# Patient Record
Sex: Female | Born: 1970
Health system: Southern US, Community
[De-identification: ages and names within clinical notes are randomized; demographics above are authoritative.]

## PROBLEM LIST (undated history)

## (undated) DIAGNOSIS — Z6833 Body mass index (BMI) 33.0-33.9, adult: Secondary | ICD-10-CM

## (undated) DIAGNOSIS — N809 Endometriosis, unspecified: Secondary | ICD-10-CM

## (undated) DIAGNOSIS — F649 Gender identity disorder, unspecified: Secondary | ICD-10-CM

## (undated) DIAGNOSIS — F411 Generalized anxiety disorder: Secondary | ICD-10-CM

## (undated) DIAGNOSIS — E785 Hyperlipidemia, unspecified: Principal | ICD-10-CM

## (undated) DIAGNOSIS — F329 Major depressive disorder, single episode, unspecified: Secondary | ICD-10-CM

## (undated) DIAGNOSIS — Z9889 Other specified postprocedural states: Secondary | ICD-10-CM

## (undated) DIAGNOSIS — K219 Gastro-esophageal reflux disease without esophagitis: Secondary | ICD-10-CM

## (undated) DIAGNOSIS — R112 Nausea with vomiting, unspecified: Secondary | ICD-10-CM

## (undated) HISTORY — DX: Gender identity disorder, unspecified: F64.9

## (undated) HISTORY — DX: Endometriosis, unspecified: N80.9

## (undated) HISTORY — DX: Hyperlipidemia, unspecified: E78.5

## (undated) HISTORY — DX: Major depressive disorder, single episode, unspecified: F32.9

## (undated) HISTORY — DX: Body mass index (BMI) 33.0-33.9, adult: Z68.33

## (undated) HISTORY — DX: Generalized anxiety disorder: F41.1

---
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
## 2000-02-20 ENCOUNTER — Other Ambulatory Visit: Admission: RE | Admit: 2000-02-20 | Discharge: 2000-02-20 | Payer: Self-pay | Admitting: Internal Medicine

## 2001-04-11 ENCOUNTER — Other Ambulatory Visit: Admission: RE | Admit: 2001-04-11 | Discharge: 2001-04-11 | Payer: Self-pay | Admitting: Obstetrics and Gynecology

## 2001-05-23 ENCOUNTER — Ambulatory Visit (HOSPITAL_COMMUNITY): Admission: RE | Admit: 2001-05-23 | Discharge: 2001-05-23 | Payer: Self-pay | Admitting: Obstetrics and Gynecology

## 2001-05-23 ENCOUNTER — Encounter: Payer: Self-pay | Admitting: Obstetrics and Gynecology

## 2001-10-28 ENCOUNTER — Encounter (INDEPENDENT_AMBULATORY_CARE_PROVIDER_SITE_OTHER): Payer: Self-pay | Admitting: *Deleted

## 2001-10-28 ENCOUNTER — Inpatient Hospital Stay (HOSPITAL_COMMUNITY): Admission: AD | Admit: 2001-10-28 | Discharge: 2001-10-31 | Payer: Self-pay | Admitting: Obstetrics and Gynecology

## 2001-11-11 ENCOUNTER — Encounter: Admission: RE | Admit: 2001-11-11 | Discharge: 2001-12-11 | Payer: Self-pay | Admitting: Obstetrics and Gynecology

## 2004-12-17 HISTORY — PX: OOPHORECTOMY: SHX86

## 2005-02-22 ENCOUNTER — Other Ambulatory Visit: Admission: RE | Admit: 2005-02-22 | Discharge: 2005-02-22 | Payer: Self-pay | Admitting: Internal Medicine

## 2005-12-17 HISTORY — PX: COLONOSCOPY: SHX174

## 2005-12-17 HISTORY — PX: FINGER SURGERY: SHX640

## 2006-02-26 LAB — HM PAP SMEAR

## 2006-02-27 ENCOUNTER — Other Ambulatory Visit: Admission: RE | Admit: 2006-02-27 | Discharge: 2006-02-27 | Payer: Self-pay | Admitting: Internal Medicine

## 2006-12-17 LAB — HM COLONOSCOPY

## 2007-04-16 ENCOUNTER — Ambulatory Visit: Payer: Self-pay | Admitting: Gastroenterology

## 2007-05-16 ENCOUNTER — Ambulatory Visit: Payer: Self-pay | Admitting: Obstetrics and Gynecology

## 2007-05-23 ENCOUNTER — Ambulatory Visit: Payer: Self-pay | Admitting: Obstetrics and Gynecology

## 2008-10-12 ENCOUNTER — Ambulatory Visit: Payer: Self-pay | Admitting: Internal Medicine

## 2009-08-08 ENCOUNTER — Ambulatory Visit: Payer: Self-pay | Admitting: Internal Medicine

## 2010-02-09 ENCOUNTER — Ambulatory Visit: Payer: Self-pay | Admitting: Internal Medicine

## 2010-12-22 ENCOUNTER — Ambulatory Visit
Admission: RE | Admit: 2010-12-22 | Discharge: 2010-12-22 | Payer: Self-pay | Source: Home / Self Care | Attending: Internal Medicine | Admitting: Internal Medicine

## 2011-05-04 NOTE — Op Note (Signed)
[facility]  Patient:    Katherine Steele, Katherine Steele Visit Number: [id number] MRN: [id number]          Service Type: OBS Location: [facility] Attending Physician:  Oliver Pila Dictated by:   Alvino Chapel, M.D. Proc. Date: 10/28/01                             Operative Report  PREOPERATIVE DIAGNOSES:       1. Forty-plus weeks gestation.                               2. Arrest of dilation.                               3. Nonreassuring fetal tracing.                               4. Suspected partial abruption.  POSTOPERATIVE DIAGNOSES:      1. Forty-plus weeks gestation.                               2. Arrest of dilation.                               3. Nonreassuring fetal tracing.                               4. Suspected partial abruption.  OPERATION:                    Cesarean section.  SURGEON:                      Alvino Chapel, M.D.  ANESTHESIA:                   Epidural.  FINDINGS:                     There was a viable female infant in the vertex presentation.  Apgars 8 and 9.  Weight 9 pounds 6 ounces.  There was a double nuchal cord noted at delivery.  ESTIMATED BLOOD LOSS:         700 cc.  URINE OUTPUT:                 450 cc clear urine.  IV FLUIDS:                    1800 cc lactated Ringers.  DESCRIPTION OF PROCEDURE:     The patient was taken to the operating room where Allis clamp test was found to be adequate and insured good anesthesia. The patient was then prepped and draped in the normal sterile fashion in the dorsal supine position with a leftward tilt.   A Pfannenstiel skin incision was then made and carried through to the underlying layer of fascia by sharp dissection and Bovie cautery.  The fascia was then nicked in the midline and the incision was extended laterally with Mayo scissors.  The inferior aspect of the incision was grasped with Kocher clamps, elevated and  dissected off the off the underlying  rectus muscles.  In a similar fashion, the superior aspect was grasped, elevated and dissected off the rectus muscles.  The rectus muscles were then separated in the midline, the peritoneal layer identified and entered bluntly.  The peritoneal incision was then extended both superiorly and inferiorly with our full attention to avoid both bowel and bladder.  The bladder blade was then inserted and a bladder flap created with Metzenbaum scissors.  This was then peeled away from the lower uterine segment and the bladder blade replaced.  The lower uterine segment was then incised in a transverse fashion and extended bluntly digitally.  The infants head was then delivered without difficulty.  A double nuchal cord was noted and reduced.  The remainder of the infant was delivered without problems.  Bulb suctioned and the cord clamped and handed to the waiting pediatricians.  Cord blood was obtained and the placenta was expressed manually.  The uterus was then slightly atonic but responded well to rapid Pitocin infusion and bimanual massage.  The uterine incision was then closed with 0 chromic in a running locked fashion.  After closure, the tubes and ovaries were inspected and found to be normal and the uterine incision itself was found to be hemostatic. Therefore, all instruments and sponges were removed from the abdomen.  The rectus muscles were reapproximated with three interrupted sutures.  The fascia was closed with 0 Vicryl in a running fashion and the skin was closed with staples.  Sponge, lap and needle counts were again correct x 2.  The patient was taken to the recovery room in stable condition. Dictated by:   Alvino Chapel, M.D. Attending Physician:  Oliver Pila DD:  10/28/01 TD:  10/29/01 Job: [id number]

## 2011-05-04 NOTE — Discharge Summary (Signed)
[facility]  Patient:    Katherine Steele, CUEN Visit Number: [id number] MRN: [id number]          Service Type: OBS Location: 910A [id number] [id number] Attending Physician:  Oliver Pila Dictated by:   Alvino Chapel, M.D. Admit Date:  10/28/2001 Discharge Date: 10/31/2001                             Discharge Summary  DISCHARGE DIAGNOSES:          1. Term pregnancy at 40 weeks.                               2. Arrest of dilation.                               3. Suspected partial abruption.                               4. Nonreassuring fetal tracing.                               5. Primary low transverse cesarean section.  DISCHARGE MEDICATIONS:        1. Motrin 600 mg q.6h. p.r.n.                               2. Percocet 1-2 tablets p.o. q.4h. p.r.n.  DISCHARGE FOLLOW-UP:          The patient is to follow up in two weeks for an incision check, and again in six weeks for her postpartum examination.  CURRENT HISTORY:              The patient is a 40 year old G1, P0 who was admitted at 40-5/7 weeks for induction, given postdates and favorable cervix. The pregnancy had been complicated by an isolated echogenic endocardiac focus fetus, and no other significant findings.  PRENATAL LABS:  Blood type A positive.  Antibody negative.  RPR nonreactive. Rubella immune.  Hepatitis B surface antigen negative.  HIV negative.  GC negative.  Chlamydia negative.  GBS negative.  PAST OBSTETRICAL HISTORY:     None.  PAST GYN HISTORY:             No abnormal Pap smears.  PAST SURGICAL HISTORY:        Tonsillectomy.  PAST MEDICAL HISTORY:         None.  ALLERGIES:                    No known drug allergies.  MEDICATIONS:                  None.  HOSPITAL COURSE:              The patient is afebrile with stable vital signs. Fetal heart rate was reactive, with small variables noted on admission. Estimated fetal weight was 8.0 to 8.5 pounds.  Cervix was 50%  effaced, 3 cm and -2 station.  The patient had assisted rupture of membranes, with clear fluid obtained.  She was begun on IV Pitocin.  She began to progress throughout the day and by evening time had reached  7 cm, with 80% effaced and the head remained at a -1 station.  Over the next two hours the patients contractions were monitored with an IUPC, and were borderline adequate in a somewhat dysfunctional pattern.  Approximately two hours after IUPC placement the patient began to pass significant clots and vaginal bleeding.  The fetal heart rate remained reassuring, with some accelerations and occasional variable decelerations; however, a marginal abruption was suspected.  The patients Pitocin was again adjusted to achieve somewhat adequate contractions.  However, in two hours she was 80%, 7-8 and -1 station; the fetal heart rate had begun to have decreasing variability with no significant scalp stimulation.  Given her arrest of dilation and developing nonreassuring fetal tracing, the decision was made with the patient to proceed with cesarean section.  She then underwent a low transverse cesarean section under epidural anesthesia.  She was delivered of a vigorous female infant.  Apgars were 8 and 9.  Weight was 9 pounds 8 ounces, and there was a double nuchal cord noted. There were some clots and the placenta was sent to pathology for evaluation for possible small abruption.  The patient was then admitted for routine postoperative care and did very well.  Her postoperative hemoglobin went from 13.5 to 11.7.  On postoperative day #3 she was tolerating regular diet, passing flatus and her incision was well approximated with Steri-Strips.  No erythema was noted.  Therefore, she was felt stable for discharge and was discharged to home, to follow up in two weeks for her incision check. Dictated by:   Alvino Chapel, M.D. Attending Physician:  Oliver Pila DD:  10/31/01 TD:   10/31/01 Job: [id number]

## 2012-02-07 ENCOUNTER — Encounter: Payer: Self-pay | Admitting: Internal Medicine

## 2012-02-07 ENCOUNTER — Ambulatory Visit (INDEPENDENT_AMBULATORY_CARE_PROVIDER_SITE_OTHER): Payer: 59 | Admitting: Internal Medicine

## 2012-02-07 VITALS — BP 110/64 | HR 72 | Temp 98.5°F | Ht 68.5 in | Wt 188.0 lb

## 2012-02-07 DIAGNOSIS — F330 Major depressive disorder, recurrent, mild: Secondary | ICD-10-CM | POA: Insufficient documentation

## 2012-02-07 DIAGNOSIS — F329 Major depressive disorder, single episode, unspecified: Secondary | ICD-10-CM

## 2012-02-07 DIAGNOSIS — F419 Anxiety disorder, unspecified: Secondary | ICD-10-CM | POA: Insufficient documentation

## 2012-02-07 DIAGNOSIS — F411 Generalized anxiety disorder: Secondary | ICD-10-CM

## 2012-02-07 DIAGNOSIS — F3289 Other specified depressive episodes: Secondary | ICD-10-CM

## 2012-02-07 DIAGNOSIS — F331 Major depressive disorder, recurrent, moderate: Secondary | ICD-10-CM | POA: Insufficient documentation

## 2012-02-07 NOTE — Patient Instructions (Addendum)
Take Wellbutrin XL 150 mg daily for 2 weeks and call with progress report.

## 2012-02-07 NOTE — Progress Notes (Signed)
  Subjective:    Patient ID: Katherine Steele, female    DOB: 1971/08/10, 41 y.o.   MRN: [id number]  HPI Patient in today to discuss anxiety& depression. She used to take Prozac but she felt that  always gave her a headache. Says she did not tolerate Paxil or Zoloft either. She's been doing well up until recently. Her husband has been unemployed for for several years.. He was working as an International aid/development worker at a convenient store but had marijuana detected in a urine specimen on random drug testing. He lost his job. Has been unable to find other employment. He continues to have issues with his back and is scheduled to see a neurosurgeon in the near future. He has been going to [employer] @ [facility]. Her son is 36 years old and has mild autism. Husband went to truck driving school but apparently could not pass the driver's road test. She realizes she was getting angry that he could not find employment. She does have some Xanax on hand for anxiety but think she needs to restart antidepressant therapy. Has had some trouble sleeping. Patient is bisexual. At one point, patient considered having a sex change operation a few years ago. Husband is 19 years old. They met a number of years ago when they were working for the same employer. Patient is employed but basically has become the sole provider at this point. Says that she is less angry recently. Has thought about seeking counseling at [facility]. They have been attending church together and that seems to have helped a bit.    Review of Systems     Objective:   Physical Exam affect is depressed. Patient not suicidal. Thought processes are normal. Judgment normal.        Assessment & Plan:  Anxiety  Depression  Situational stress  Plan: Patient wants to try Wellbutrin instead of an SSRI. Have prescribed Wellbutrin XL 150 mg daily. She is to call in 2 weeks and let me know if it is working. We can increase to 300 mg XL. Explained to her it would not  probably be of benefit to go higher than 300 mg of Wellbutrin. Also explained to her that we might need to add an SSRI. She has Xanax on hand for anxiety if she needs it. She thought about taking BuSpar but read it could cause some dizziness. I think we should just stay with Xanax which worked well for her and she obviously does not have a dependency on it. She has some leftover from previous prescription.

## 2012-02-28 ENCOUNTER — Telehealth: Payer: Self-pay | Admitting: Internal Medicine

## 2012-02-28 NOTE — Telephone Encounter (Signed)
Continue same dose for now

## 2012-03-05 ENCOUNTER — Other Ambulatory Visit: Payer: Self-pay | Admitting: Internal Medicine

## 2012-03-21 ENCOUNTER — Encounter: Payer: Self-pay | Admitting: Vascular Surgery

## 2012-03-24 ENCOUNTER — Ambulatory Visit (INDEPENDENT_AMBULATORY_CARE_PROVIDER_SITE_OTHER): Payer: BC Managed Care – PPO | Admitting: Vascular Surgery

## 2012-03-24 ENCOUNTER — Ambulatory Visit (INDEPENDENT_AMBULATORY_CARE_PROVIDER_SITE_OTHER): Payer: BC Managed Care – PPO | Admitting: *Deleted

## 2012-03-24 ENCOUNTER — Encounter: Payer: Self-pay | Admitting: Vascular Surgery

## 2012-03-24 VITALS — BP 117/77 | HR 52 | Resp 16 | Ht 68.5 in | Wt 188.0 lb

## 2012-03-24 DIAGNOSIS — I83893 Varicose veins of bilateral lower extremities with other complications: Secondary | ICD-10-CM | POA: Insufficient documentation

## 2012-03-24 DIAGNOSIS — R609 Edema, unspecified: Secondary | ICD-10-CM

## 2012-03-24 DIAGNOSIS — M6240 Contracture of muscle, unspecified site: Secondary | ICD-10-CM

## 2012-03-24 NOTE — Progress Notes (Signed)
Subjective:     Patient ID: Katherine Steele, female   DOB: 03-Mar-1971, 41 y.o.   MRN: [id number]  HPI is 41 year old healthy female has been having aching discomfort in both legs particularly below the knees. This occurs generally at night when she is in bed and her legs become quite restless. She denies classic claudication symptoms and has no history of DVT, thrombophlebitis, stasis ulcers, bleeding, or bulging varicosities. She does not experience swelling as the day progresses. She has not tried elevating her legs at night. Generally she will get up and walk around and this will improve her symptoms. Right leg is equal to the left.  Past Medical History  Diagnosis Date  . Anxiety   . Depression     History  Substance Use Topics  . Smoking status: Former Smoker    Types: Cigarettes    Quit date: 02/06/1993  . Smokeless tobacco: Never Used  . Alcohol Use: Yes     rarely    Family History  Problem Relation Age of Onset  . Diabetes Mother   . Hypertension Father     No Known Allergies  Current outpatient prescriptions:buPROPion (WELLBUTRIN XL) 150 MG 24 hr tablet, TAKE 1 TABLET BY MOUTH DAILY, Disp: 30 tablet, Rfl: 0;  ALPRAZolam (XANAX) 0.5 MG tablet, Take 0.5 mg by mouth at bedtime as needed., Disp: , Rfl: ;  FLUoxetine (PROZAC) 40 MG capsule, Take 40 mg by mouth daily., Disp: , Rfl:   BP 117/77  Pulse 52  Resp 16  Ht 5' 8.5" (1.74 m)  Wt 188 lb (85.276 kg)  BMI 28.17 kg/m2  Body mass index is 28.17 kg/(m^2).          Review of Systems denies chest pain, dyspnea on exertion, PND, orthopnea, chronic bronchitis. All systems are negative and complete review of systems    Objective:   Physical Exam blood pressure 117/77 heart rate 60 respirations 16 Gen.-alert and oriented x3 in no apparent distress HEENT normal for age Lungs no rhonchi or wheezing Cardiovascular regular rhythm no murmurs carotid pulses 3+ palpable no bruits audible Abdomen soft nontender no  palpable masses Musculoskeletal free of  major deformities Skin clear -no rashes Neurologic normal Lower extremities 3+ femoral and dorsalis pedis pulses palpable bilaterally with no edema she has diffuse spider veins in both anterior thighs extending into the lateral calf area bilaterally. There is no hyperpigmentation or ulceration or bulging varicosities noted.  Today I ordered a venous duplex exam of both legs which I reviewed and interpreted. There are some areas in both GS disease that have reflux but the caliber of the vein is small bilaterally. There is no DVT. There is mild reflux in the deep system.     Assessment:     Bilateral spider veins Aching discomfort in both lower extremities usually at night-etiology unknown     Plan:     Have discussed sclerotherapy for spider veins with patient and she will consider this. Do not think spider veins are causing her symptoms

## 2012-04-01 ENCOUNTER — Encounter: Payer: Self-pay | Admitting: *Deleted

## 2012-04-02 ENCOUNTER — Ambulatory Visit (INDEPENDENT_AMBULATORY_CARE_PROVIDER_SITE_OTHER): Payer: BC Managed Care – PPO | Admitting: *Deleted

## 2012-04-02 ENCOUNTER — Encounter: Payer: Self-pay | Admitting: *Deleted

## 2012-04-02 DIAGNOSIS — I781 Nevus, non-neoplastic: Secondary | ICD-10-CM

## 2012-04-02 NOTE — Progress Notes (Signed)
X=.3% Sotradecol administered with a 27g butterfly.  Patient received a total of 12cc.  Treated majority of her spider veins which cause her pain/restless legs. Tol well. Easy access. She will need another treatment to get them all. Will follow prn.  Photos: yes  Compression stockings applied: yes

## 2012-04-03 ENCOUNTER — Encounter: Payer: Self-pay | Admitting: Vascular Surgery

## 2012-04-07 ENCOUNTER — Other Ambulatory Visit: Payer: Self-pay | Admitting: Internal Medicine

## 2012-04-08 NOTE — Procedures (Unsigned)
LOWER EXTREMITY VENOUS REFLUX EXAM  INDICATION:  Bilateral lower extremity edema.  EXAM:  Using color-flow imaging and pulse Doppler spectral analysis, the bilateral common femoral, superficial femoral, popliteal, posterior tibial, greater and lesser saphenous veins are evaluated.  There is evidence suggesting mild deep venous insufficiency in the bilateral lower extremities.  The right saphenofemoral junction is competent. The left saphenofemoral junction is slightly incompetent with reflux >500 milliseconds.  The bilateral GSV are not competent with Reflux of >500 milliseconds with the caliber as described below.)   The right proximal  small saphenous vein demonstrates incompetency.  GSV Diameter (used if found to be incompetent only)                                                       Right      Left Proximal Greater Saphenous Vein                       0.40 cm Proximal-to-mid-thigh                                 0.19 cm    0.22 cm Mid thigh                                             0.26 cm    0.22 cm Mid-distal thigh                                      0.24 cm    0.20 cm Distal thigh                                          0.23 cm Knee                                                             0.20 cm  SSV RIGHT:  0.21cm   IMPRESSION: 1. The bilateral greater saphenous veins are not competent with reflux     >500 milliseconds. 2. The bilateral greater saphenous veins are not tortuous. 3. The deep venous system is slightly incompetent bilaterally with     reflux of >500 milliseconds. 4. The right small saphenous vein is not competent with Reflux of >500     milliseconds. 5. No evidence of deep venous thrombosis bilaterally.  ___________________________________________ Katherine Steele, M.D.  SS/MEDQ  D:  03/24/2012  T:  03/24/2012  Job:  [id number]

## 2012-04-09 ENCOUNTER — Ambulatory Visit: Payer: BC Managed Care – PPO

## 2012-05-06 ENCOUNTER — Telehealth: Payer: Self-pay

## 2012-05-06 MED ORDER — BUPROPION HCL ER (XL) 300 MG PO TB24: 300.0000 mg | ORAL_TABLET | Freq: Every day | ORAL | Status: DC

## 2012-05-06 NOTE — Telephone Encounter (Signed)
Patient is taking Wellbutrin 150 mg daily, but wants to increase the dose. Requesting a 90 day supply, although she only has 4 pills left. ?OV

## 2012-05-06 NOTE — Telephone Encounter (Signed)
Patient was here in Feb and was told to call with progress report. This is the first progress report. Suggest we call in Wellbutrin 300mg  XL #90 with no refill and see her in next 2- 3 months. They are having financial problems.

## 2012-05-23 ENCOUNTER — Other Ambulatory Visit: Payer: Self-pay

## 2012-05-23 MED ORDER — BUPROPION HCL ER (XL) 300 MG PO TB24: 300.0000 mg | ORAL_TABLET | Freq: Every day | ORAL | Status: DC

## 2012-07-17 ENCOUNTER — Ambulatory Visit (INDEPENDENT_AMBULATORY_CARE_PROVIDER_SITE_OTHER): Payer: BC Managed Care – PPO | Admitting: Internal Medicine

## 2012-07-17 ENCOUNTER — Encounter: Payer: Self-pay | Admitting: Internal Medicine

## 2012-07-17 VITALS — BP 116/68 | HR 80 | Temp 98.2°F | Ht 68.0 in | Wt 179.0 lb

## 2012-07-17 DIAGNOSIS — F411 Generalized anxiety disorder: Secondary | ICD-10-CM

## 2012-07-17 DIAGNOSIS — F329 Major depressive disorder, single episode, unspecified: Secondary | ICD-10-CM

## 2012-07-17 DIAGNOSIS — F640 Transsexualism: Secondary | ICD-10-CM | POA: Insufficient documentation

## 2012-07-17 DIAGNOSIS — F419 Anxiety disorder, unspecified: Secondary | ICD-10-CM

## 2012-07-17 DIAGNOSIS — F529 Unspecified sexual dysfunction not due to a substance or known physiological condition: Secondary | ICD-10-CM

## 2012-07-17 NOTE — Progress Notes (Signed)
  Subjective:    Patient ID: Katherine Steele, female    DOB: 1971/11/05, 41 y.o.   MRN: [id number]  HPI 41 year old white female with history of anxiety depression in today for medication recheck. She feels it Wellbutrin XL 300 mg daily was too strong for her and call some headaches. Admits being depressed. Says issues surround being unhappy in her marriage. Patient has continued to consider sex change operation. Has been to counselor. Has not been lately and will be returning to counseling soon. She has a 41 year old son from her marriage. He apparently has some mild autism. Admits to many years of conflict  with her sexuality. She has been to see endocrinologist about possible sex change operation. She is worried how this might affect her son and her relationship with her son. She is also worried about how it would affect situation with her husband. She and her husband basically are companions with sexual activity. Patient says for years she has thought she should be female. She went through a lot of religious conflict about it. Says she's work through that. Is worried about how this would effect her immediate family. Thinks mother might be supportive. Affect is a bit flat today. Seems genuinely depressed. Says she knows that the issues surrounding her depression in today with conflict with her sexuality. Is not suicidal. Did not like taking Prozac. Doesn't want to add any other antidepressant to the Wellbutrin.    Review of Systems     Objective:   Physical Exam affect is flat, judgment appears to be appropriate. Thought process is normal. Not crying.  Spent 20 minutes speaking with patient about these issues, discussed medication possibilities, expressed support and concern.        Assessment & Plan:  Conflict with sexuality  Depression  Anxiety  Situational stress  Plan: Xanax 0.5 mg (#60) 1/2-1 by mouth twice a day to take a local drug store. Xanax 0.5 mg (#180) one half to one by mouth  twice a day with no refill sent to mail order prescription service. Wellbutrin XL 150 mg ( #30 )  by mouth daily with one refill to take to a local drug store. Wellbutrin XL 150 mg #90 with when necessary 1 year refill sent to mail order prescription service. Patient is to call me with progress report in a few weeks. Encouraged her to return to counseling.

## 2012-07-17 NOTE — Patient Instructions (Addendum)
Wellbutrin has been reduced from 300 mg to 150 mg XL daily. Take Xanax sparingly as needed for anxiety. Please return to counseling regarding sexuality issues.

## 2012-07-18 ENCOUNTER — Other Ambulatory Visit: Payer: Self-pay

## 2012-07-18 MED ORDER — ALPRAZOLAM 0.5 MG PO TABS: 0.5000 mg | ORAL_TABLET | Freq: Two times a day (BID) | ORAL | Status: DC | PRN

## 2012-07-18 MED ORDER — BUPROPION HCL ER (XL) 150 MG PO TB24: 150.0000 mg | ORAL_TABLET | Freq: Every day | ORAL | Status: DC

## 2012-09-23 ENCOUNTER — Ambulatory Visit: Payer: Self-pay | Admitting: Internal Medicine

## 2012-12-17 DIAGNOSIS — F649 Gender identity disorder, unspecified: Secondary | ICD-10-CM

## 2012-12-17 HISTORY — DX: Gender identity disorder, unspecified: F64.9

## 2012-12-17 HISTORY — PX: TOTAL ABDOMINAL HYSTERECTOMY: SHX209

## 2012-12-25 ENCOUNTER — Ambulatory Visit: Payer: Self-pay | Admitting: Obstetrics and Gynecology

## 2012-12-25 LAB — CBC
HCT: 39.6 % (ref 35.0–47.0)
HGB: 13.2 g/dL (ref 12.0–16.0)
MCV: 90 fL (ref 80–100)
Platelet: 289 10*3/uL (ref 150–440)
RBC: 4.43 10*6/uL (ref 3.80–5.20)
WBC: 6.9 10*3/uL (ref 3.6–11.0)

## 2012-12-25 LAB — PREGNANCY, URINE: Pregnancy Test, Urine: NEGATIVE m[IU]/mL

## 2012-12-29 ENCOUNTER — Inpatient Hospital Stay: Payer: Self-pay | Admitting: Obstetrics and Gynecology

## 2012-12-31 LAB — PATHOLOGY REPORT

## 2013-04-03 IMAGING — CT CT ABD-PELV W/ CM
1 of 2 series · 15 of 32 positions shown, 19 images · non-contrast
Comparison: none

REASON FOR EXAM: Call report [phone]   Eval appendicitis   RLQ pain
COMMENTS:

[Series 2: soft tissue · axial · 0.73mm/px · z∈[-536,-113]mm · 15 of 153 slices shown, 19 images]
[im 6/153  soft-tissue]
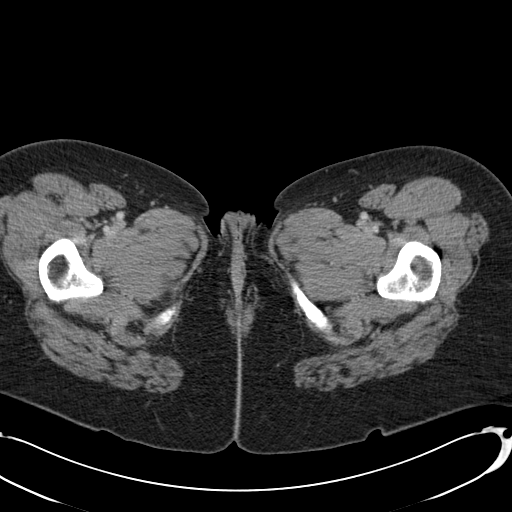
[im 6/153  bone]
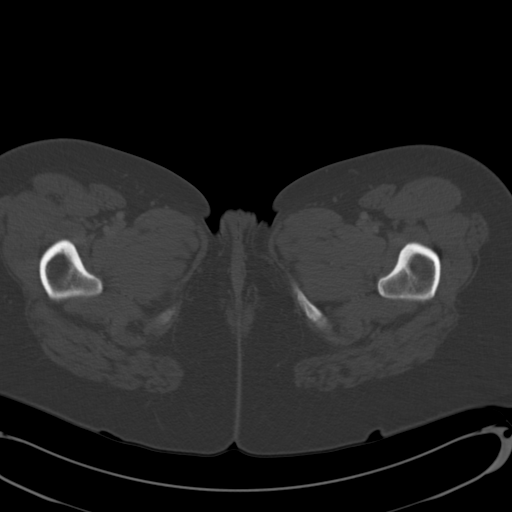
[im 18/153  soft-tissue]
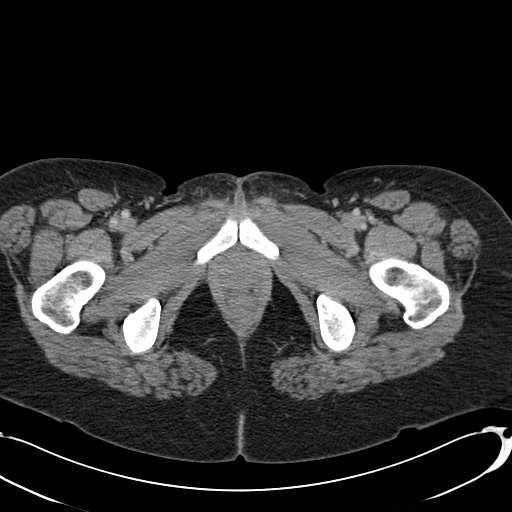
[im 30/153  soft-tissue]
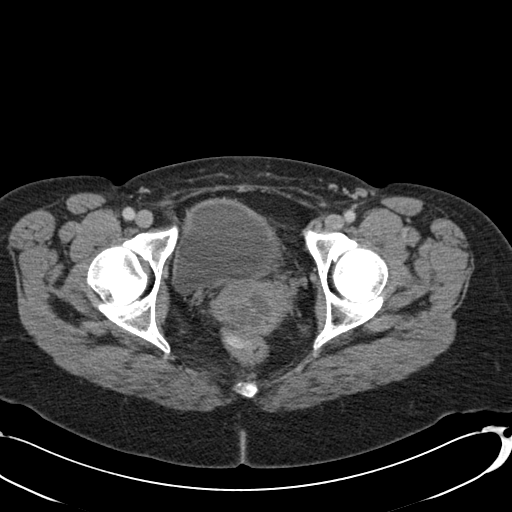
[im 41/153  soft-tissue]
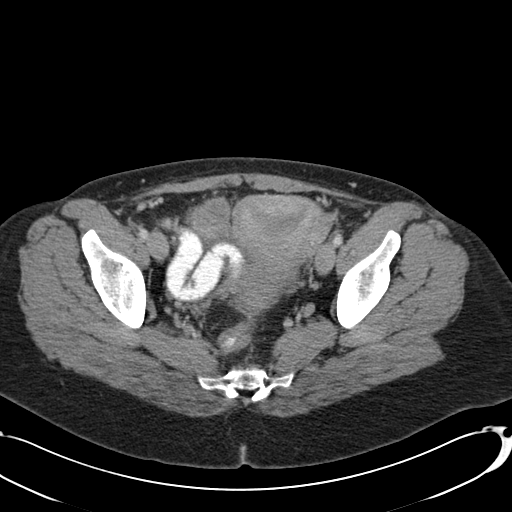
[im 53/153  soft-tissue]
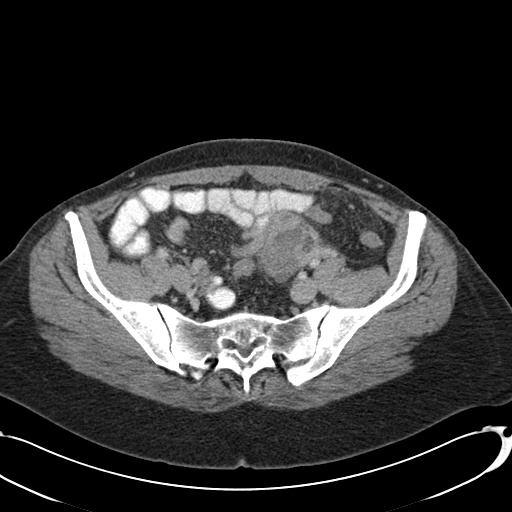
[im 65/153  soft-tissue]
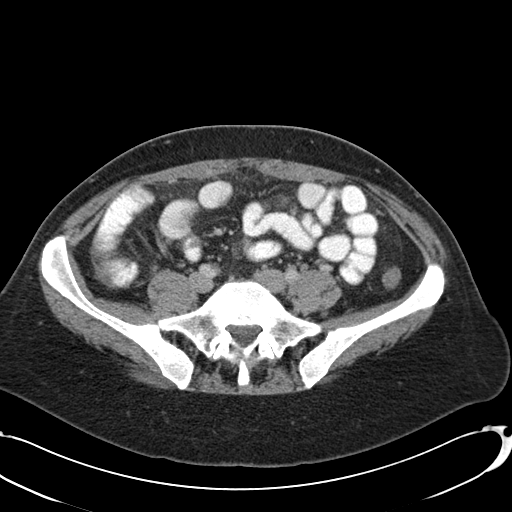
[im 77/153  soft-tissue]
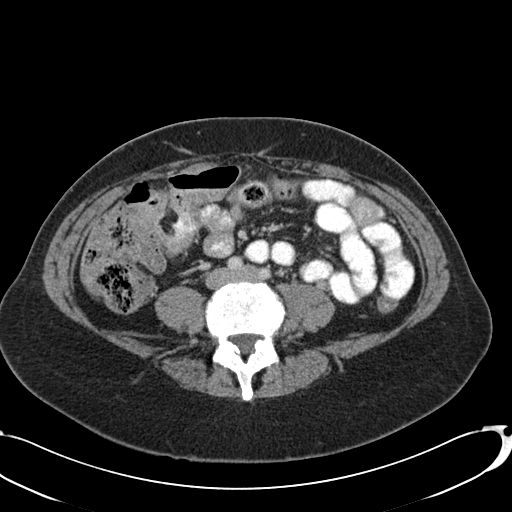
[im 88/153  soft-tissue]
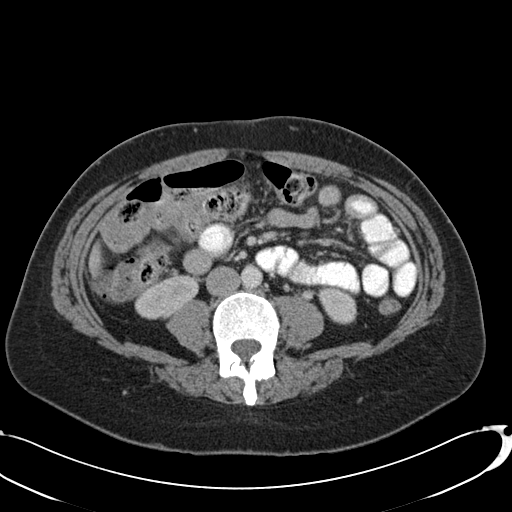
[im 100/153  soft-tissue]
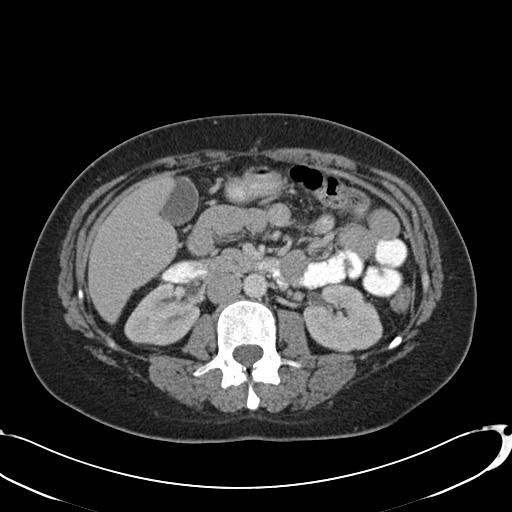
[im 100/153  bone]
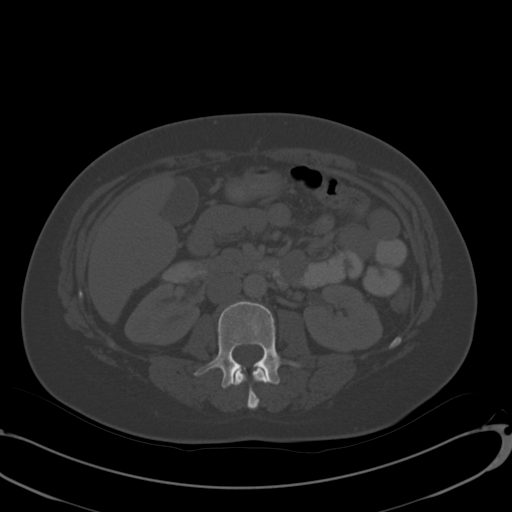
[im 112/153  soft-tissue]
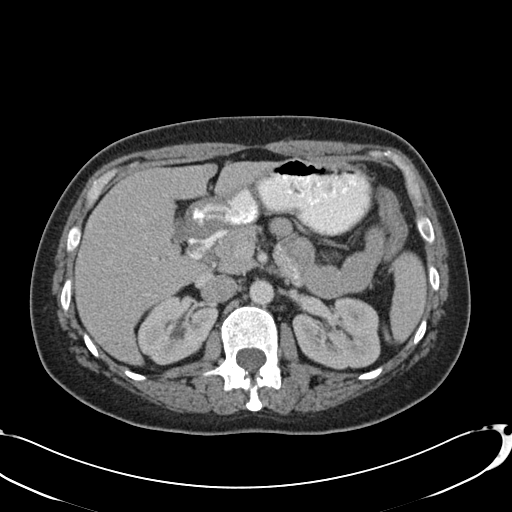
[im 123/153  soft-tissue]
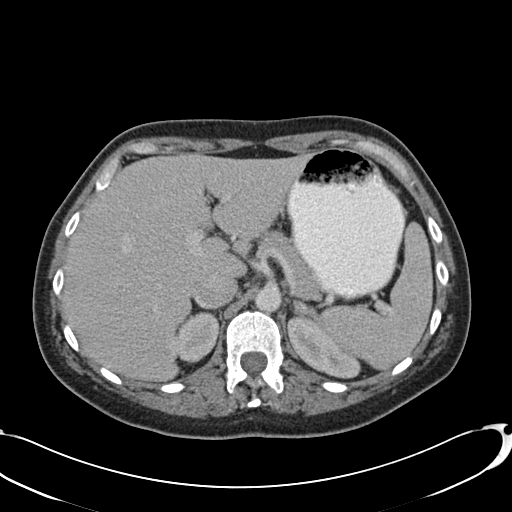
[im 129/153  lung]
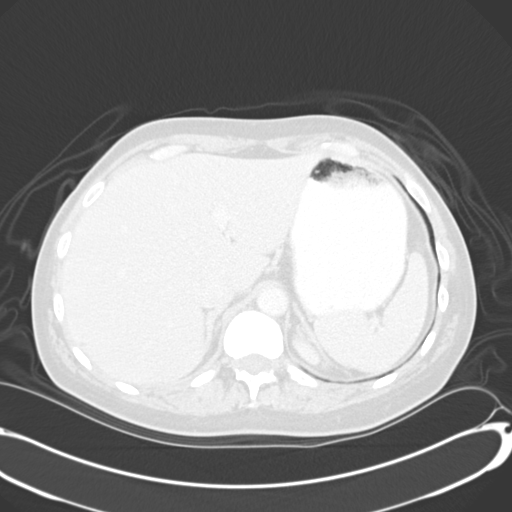
[im 135/153  soft-tissue]
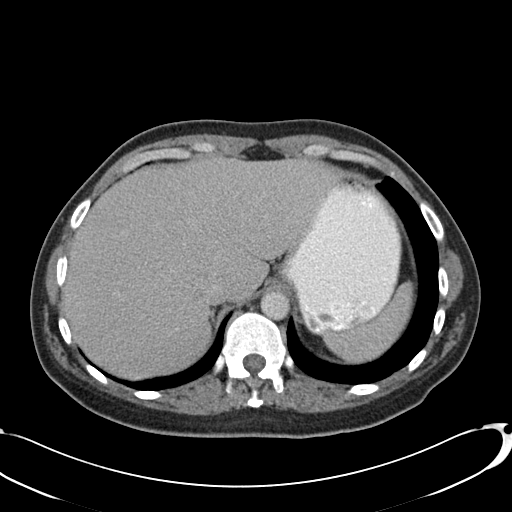
[im 135/153  lung]
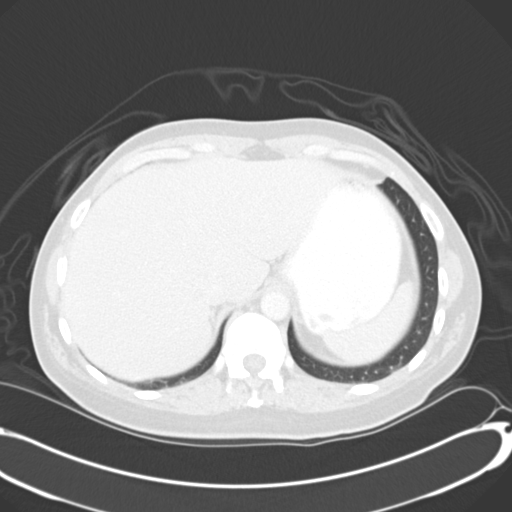
[im 141/153  lung]
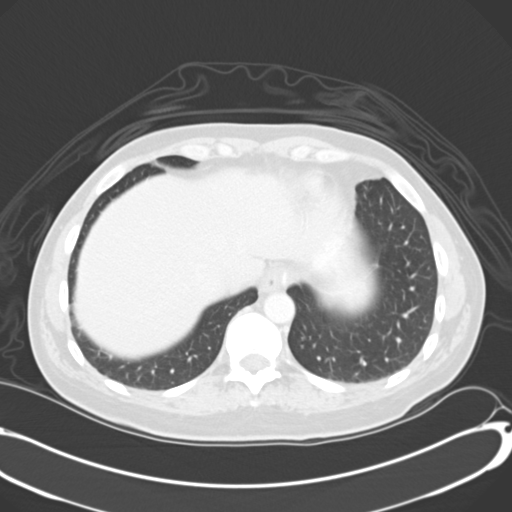
[im 147/153  soft-tissue]
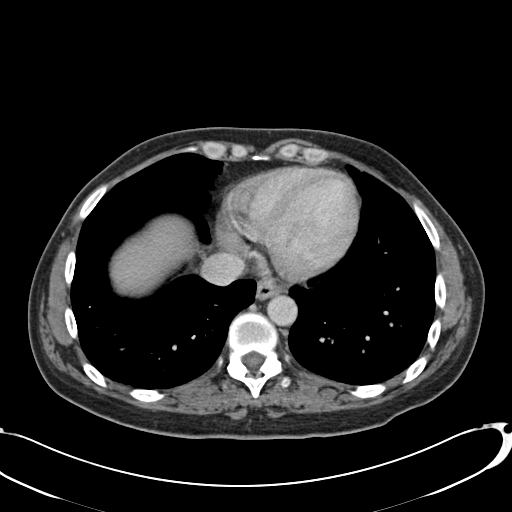
[im 147/153  lung]
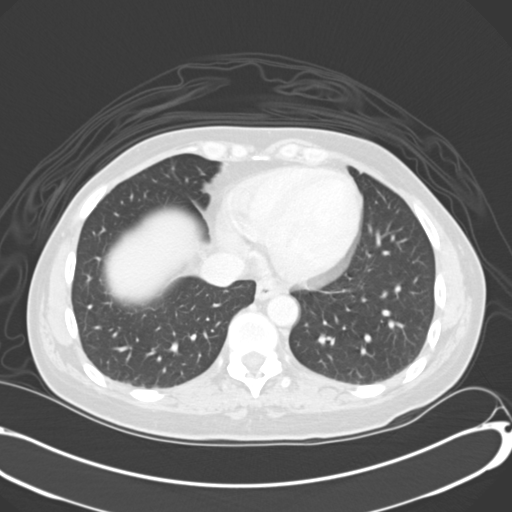

[15 of 32 positions shown; findings below may reference images not displayed]

PROCEDURE:     CT  - CT ABDOMEN / PELVIS  W  - September 23, 2012  [DATE]

RESULT:     Emergent CT of the abdomen and pelvis is performed with 100 mL
of Isovue 300 iodinated intravenous contrast with oral contrast also
utilized. Images are reconstructed at 3 mm slice thickness in the axial
plane area there is no previous exam for comparison.

The appendix is not identified. The patient reports previous surgical
history of resection of the right ovary. There is an abnormal appearance of
the left ovary presumably the heterogeneous mass superior to the uterus
which is somewhat to the left of midline. This mass contains a cyst the with
a diameter of approximately 4.4 cm on image 105. The overall size of the
mass which is presumably the ovary measures 5.77 x 4.12 cm on image 105. The
urinary bladder contains a small amount of urine. There is no ascites
comment definite inflammatory stranding or abnormal fluid collection. No
abnormal bowel distention or definite abnormal bowel wall thickening is
appreciated. Some of the loops of small bowel appear to have a suggestion of
some slight thickening of the wall but this could be secondary to
peristalsis. Correlate for enteritis. The abdominal aorta is normal in
caliber. Neither kidney shows a mass or obstructive change. No stones are
evident. The gallbladder is unremarkable. The liver, spleen and pancreas
appear normal. The adrenal glands are unremarkable. The lung bases appear
clear. The bony structures appear unremarkable.
IMPRESSION: 1. Nonvisualization of the appendix. This does not exclude the possibility
of appendicitis but no definite inflammatory changes are seen within the
right abdomen. Close correlation with clinical and laboratory data is
recommended. Surgical consultation should be considered if there is
continued clinical concern for acute appendicitis.
2. Mass superior to the bladder to the left of midline may represent the
left ovary with the cystic mass. Pelvic ultrasound could be performed 4
confirmatory evaluation.

[REDACTED]

## 2013-08-13 ENCOUNTER — Ambulatory Visit: Payer: BC Managed Care – PPO | Admitting: Family Medicine

## 2013-10-20 ENCOUNTER — Ambulatory Visit (INDEPENDENT_AMBULATORY_CARE_PROVIDER_SITE_OTHER): Payer: BC Managed Care – PPO | Admitting: General Surgery

## 2013-10-20 ENCOUNTER — Encounter: Payer: Self-pay | Admitting: General Surgery

## 2013-10-20 VITALS — BP 146/86 | HR 64 | Resp 12 | Ht 68.5 in | Wt 174.0 lb

## 2013-10-20 DIAGNOSIS — R1032 Left lower quadrant pain: Secondary | ICD-10-CM

## 2013-10-20 NOTE — Patient Instructions (Addendum)
Patient to be scheduled for abdominal/pelvis CT Scan.   Patient has been scheduled for a CT abdomen/pelvis with contrast at [facility] for 10-30-13 at 3 pm (arrive 2:45 pm). Prep: no solids 4 hours prior but patient may have clear liquids up until exam time, pick up prep kit, and take medication list. Patient verbalizes understanding.

## 2013-10-20 NOTE — Progress Notes (Signed)
Patient ID: Katherine Steele, female   DOB: 06-Jul-1971, 42 y.o.   MRN: [id number]  Chief Complaint  Patient presents with  . Other    New patient evaluation of abdominal wall hernia    HPI [name] is a 42 y.o. female who presents for an evaluation of a possible abdominal wall hernia.  The patient states she noticed the hernia approximately in September 2014. She has had some pain in the left lower quadrant but no recent pain. She is not bothered by this area at this time. She noticed the pain when she was doing push ups on a regular basis. Movement makes the pain worse. She denies any nauseas, vomiting, diarrhea, constipation.   HPI  Past Medical History  Diagnosis Date  . Anxiety   . Depression     Past Surgical History  Procedure Laterality Date  . Tonsillectomy    . Oophorectomy    . Grommet tubes    . Cesarean section    . Abdominal hysterectomy      Family History  Problem Relation Age of Onset  . Diabetes Mother   . Hypertension Father     Social History History  Substance Use Topics  . Smoking status: Former Smoker    Types: Cigarettes    Quit date: 02/06/1993  . Smokeless tobacco: Never Used  . Alcohol Use: Yes     Comment: rarely    No Known Allergies  Current Outpatient Prescriptions  Medication Sig Dispense Refill  . Multiple Vitamin (MULTIVITAMIN) tablet Take 1 tablet by mouth daily.      . TESTOSTERONE IM Inject 1 mL into the muscle every 14 (fourteen) days.       No current facility-administered medications for this visit.    Review of Systems Review of Systems  Constitutional: Negative.   Respiratory: Negative.   Cardiovascular: Negative.   Gastrointestinal: Negative.     Blood pressure 146/86, pulse 64, resp. rate 12, height 5' 8.5" (1.74 m), weight 174 lb (78.926 kg), last menstrual period 07/17/2012.  Physical Exam Physical Exam  Constitutional: She is oriented to person, place, and time. She appears well-developed and  well-nourished.  Eyes: Conjunctivae are normal. No scleral icterus.  Neck: No thyromegaly present.  Cardiovascular: Normal rate, regular rhythm and normal heart sounds.   No murmur heard. Pulmonary/Chest: Effort normal and breath sounds normal.  Abdominal: Soft. Normal appearance and bowel sounds are normal. There is no tenderness. No hernia.    Lymphadenopathy:    She has no cervical adenopathy.  Neurological: She is alert and oriented to person, place, and time.  Skin: Skin is warm and dry.    Data Reviewed None  Assessment      LLQ abdoinal pain, no findings of a hernia but location may suggest a Spigelian hernia. CT would help. Discussed fully with pt.  Plan    Patient to be scheduled for abdominal/pelvis CT scan with contrast. Follow up to be announced.     Patient has been scheduled for a CT abdomen/pelvis with contrast at [facility] for 10-30-13 at 3 pm (arrive 2:45 pm). Prep: no solids 4 hours prior but patient may have clear liquids up until exam time, pick up prep kit, and take medication list. Patient verbalizes understanding.   Yarieliz Wasser G 10/20/2013, 2:17 PM

## 2013-10-30 ENCOUNTER — Ambulatory Visit: Payer: Self-pay | Admitting: General Surgery

## 2013-11-02 ENCOUNTER — Encounter: Payer: Self-pay | Admitting: General Surgery

## 2013-11-05 ENCOUNTER — Telehealth: Payer: Self-pay | Admitting: *Deleted

## 2013-11-05 NOTE — Telephone Encounter (Signed)
Message copied by Currie Paris on Thu Nov 05, 2013  9:35 AM ------      Message from: Kieth Brightly      Created: Thu Nov 05, 2013  9:29 AM       CT reviewed. Would like to see pt back in office ------

## 2013-11-05 NOTE — Telephone Encounter (Signed)
Notified patient as instructed, patient pleased. Discussed follow-up appointments, patient agrees  

## 2013-11-09 ENCOUNTER — Ambulatory Visit: Payer: Self-pay | Admitting: General Surgery

## 2013-11-25 ENCOUNTER — Encounter: Payer: Self-pay | Admitting: General Surgery

## 2013-11-25 ENCOUNTER — Ambulatory Visit (INDEPENDENT_AMBULATORY_CARE_PROVIDER_SITE_OTHER): Payer: BC Managed Care – PPO | Admitting: General Surgery

## 2013-11-25 VITALS — BP 120/70 | HR 76 | Resp 12 | Ht 68.0 in | Wt 171.0 lb

## 2013-11-25 DIAGNOSIS — R1032 Left lower quadrant pain: Secondary | ICD-10-CM

## 2013-11-25 NOTE — Progress Notes (Signed)
This is a 42 year old female here today to follow up from her CT scan. She has been having focal pain in llq area of abdomen. She reports the pain has decreased over the last week or so.  Abdomen soft, non tender. No palpable mass or hernia in left side of abdomen.    Ct reviewed showing no findings to account for her pain.  Since pain has lessened and CT is normal no further recommendations at present. If the pain recurs to a significant level would like to see her again.  Discussed fully with pt.

## 2013-11-25 NOTE — Patient Instructions (Signed)
Patient to return as needed. Patient to call us with any problems.

## 2014-01-25 ENCOUNTER — Ambulatory Visit: Payer: BC Managed Care – PPO | Admitting: Family Medicine

## 2014-05-03 ENCOUNTER — Ambulatory Visit: Payer: BC Managed Care – PPO | Admitting: Internal Medicine

## 2014-05-04 ENCOUNTER — Ambulatory Visit (INDEPENDENT_AMBULATORY_CARE_PROVIDER_SITE_OTHER): Payer: BC Managed Care – PPO | Admitting: Family Medicine

## 2014-05-04 ENCOUNTER — Encounter: Payer: Self-pay | Admitting: Family Medicine

## 2014-05-04 VITALS — BP 110/60 | HR 72 | Temp 98.1°F | Ht 68.5 in | Wt 172.0 lb

## 2014-05-04 DIAGNOSIS — F411 Generalized anxiety disorder: Secondary | ICD-10-CM

## 2014-05-04 DIAGNOSIS — Z0000 Encounter for general adult medical examination without abnormal findings: Secondary | ICD-10-CM | POA: Insufficient documentation

## 2014-05-04 DIAGNOSIS — F649 Gender identity disorder, unspecified: Secondary | ICD-10-CM

## 2014-05-04 DIAGNOSIS — F329 Major depressive disorder, single episode, unspecified: Secondary | ICD-10-CM

## 2014-05-04 DIAGNOSIS — F640 Transsexualism: Secondary | ICD-10-CM

## 2014-05-04 NOTE — Assessment & Plan Note (Signed)
Preventative protocols reviewed and updated unless pt declined. Discussed healthy diet and lifestyle.  

## 2014-05-04 NOTE — Assessment & Plan Note (Signed)
Stable on abilify, followed by Dr. Lucianne Muss.

## 2014-05-04 NOTE — Assessment & Plan Note (Addendum)
Did undergo 10 mo T treatment, now back on estrogen 2/2 hot flashes.  Has decided not to further pursue this currently.

## 2014-05-04 NOTE — Addendum Note (Signed)
Addended by: Eustaquio Boyden on: 05/04/2014 10:39 AM   Modules accepted: Orders

## 2014-05-04 NOTE — Progress Notes (Signed)
BP 110/60  Pulse 72  Temp(Src) 98.1 F (36.7 C) (Oral)  Ht 5' 8.5" (1.74 m)  Wt 172 lb (78.019 kg)  BMI 25.77 kg/m2  LMP 07/17/2012   CC: new pt to establish  Subjective:    Patient ID: Katherine Steele, female    DOB: 1971-12-11, 43 y.o.   MRN: [id number]  HPI: Katherine Steele is a 43 y.o. female presenting on 05/04/2014 for Establish Care   Prior saw Dr. Candis Shine.  Presents today to establish care, agrees to physical.  S/p total hysterectomy with oophorectomy.  Discussed HRT with OBGYN - but initially decided to undergo gender transformation treatment with testosterone for about 8-10 months, then stopped this.  Saw Dr. Ruby Cola (GYN) who initially placed on testosterone therapy.  Now on estradiol 1mg  daily for hot flashes (02/2014).  Decided to stop testosterone 2/2 family situation/concerns. May consider further transformation in the future.  H/o depression, anxiety and gender dysphoria.  Sees counselor at [facility]).  Sees psychiatrist Dr. Rober Minion at [facility].  Currently on prozac 40mg  daily and abilify 5mg  daily.  Feels doing well from this standpoint.  Preventative: Well woman - OBGYN Dr. [facility].  S/p total hysterectomy. Mammogram - has not had. Flu shot - has not had Tdap 2006 Seat belt use 100% Sunscreen use discussed, no sunburns in past year, no changing moles.  Lives with husband ) and son Docia Barrier) Occupation: 2007 Edu: Associate's degree Activity: exercise 3x/wk (stationary bike) Diet: good water, fruits/vegetables some  Relevant past medical, surgical, family and social history reviewed and updated as indicated.  Allergies and medications reviewed and updated. Current Outpatient Prescriptions on File Prior to Visit  Medication Sig  . Multiple Vitamin (MULTIVITAMIN) tablet Take 1 tablet by mouth daily.   No current facility-administered medications on file prior to visit.    Review of Systems  Constitutional:  Negative for fever, chills, activity change, appetite change, fatigue and unexpected weight change.  HENT: Negative for hearing loss.   Eyes: Negative for visual disturbance.  Respiratory: Negative for cough, chest tightness, shortness of breath and wheezing.   Cardiovascular: Negative for chest pain, palpitations and leg swelling.  Gastrointestinal: Negative for nausea, vomiting, abdominal pain, diarrhea, constipation, blood in stool and abdominal distention.  Genitourinary: Negative for hematuria and difficulty urinating.  Musculoskeletal: Negative for arthralgias, myalgias and neck pain.  Skin: Negative for rash.  Neurological: Negative for dizziness, seizures, syncope and headaches.  Hematological: Negative for adenopathy. Does not bruise/bleed easily.  Psychiatric/Behavioral: Negative for dysphoric mood. The patient is not nervous/anxious.    Per HPI unless specifically indicated above    Objective:    BP 110/60  Pulse 72  Temp(Src) 98.1 F (36.7 C) (Oral)  Ht 5' 8.5" (1.74 m)  Wt 172 lb (78.019 kg)  BMI 25.77 kg/m2  LMP 07/17/2012  Physical Exam  Nursing note and vitals reviewed. Constitutional: She is oriented to person, place, and time. She appears well-developed and well-nourished. No distress.  HENT:  Head: Normocephalic and atraumatic.  Right Ear: Hearing, tympanic membrane, external ear and ear canal normal.  Left Ear: Hearing, tympanic membrane, external ear and ear canal normal.  Nose: Nose normal.  Mouth/Throat: Uvula is midline, oropharynx is clear and moist and mucous membranes are normal. No oropharyngeal exudate, posterior oropharyngeal edema or posterior oropharyngeal erythema.  Eyes: Conjunctivae and EOM are normal. Pupils are equal, round, and reactive to light. No scleral icterus.  Neck: Normal range of motion. Neck  supple. No thyromegaly present.  Cardiovascular: Normal rate, regular rhythm, normal heart sounds and intact distal pulses.   No murmur  heard. Pulses:      Radial pulses are 2+ on the right side, and 2+ on the left side.  Pulmonary/Chest: Effort normal and breath sounds normal. No respiratory distress. She has no wheezes. She has no rales.  Abdominal: Soft. Bowel sounds are normal. She exhibits no distension and no mass. There is no tenderness. There is no rebound and no guarding.  Musculoskeletal: Normal range of motion. She exhibits no edema.  Lymphadenopathy:    She has no cervical adenopathy.  Neurological: She is alert and oriented to person, place, and time.  CN grossly intact, station and gait intact  Skin: Skin is warm and dry. No rash noted.  Psychiatric: She has a normal mood and affect. Her behavior is normal. Judgment and thought content normal.   Results for orders placed in visit on 02/07/12  HM PAP SMEAR      Result Value Ref Range   HM Pap smear [facility]    HM COLONOSCOPY      Result Value Ref Range   HM Colonoscopy [place]        Assessment & Plan:   Problem List Items Addressed This Visit   Generalized anxiety disorder     Stable on abilify, followed by Dr. Lucianne Muss.    MDD (major depressive disorder)     Chronic, stable. Followed by Dr. Lucianne Muss.    Relevant Medications      FLUoxetine (PROZAC) 40 MG capsule   Gender dysphoria     Did undergo 10 mo T treatment, now back on estrogen 2/2 hot flashes.  Has decided not to further pursue this currently.    Health maintenance examination - Primary     Preventative protocols reviewed and updated unless pt declined. Discussed healthy diet and lifestyle.         Follow up plan: Return in about 1 year (around 05/05/2015), or as needed, for annual exam, prior fasting for blood work.

## 2014-05-04 NOTE — Assessment & Plan Note (Signed)
Chronic, stable. Followed by Dr. Lucianne Muss.

## 2014-05-04 NOTE — Patient Instructions (Signed)
Return at your convenience fasting for blood work to check cholesterol and sugar. Good to see you today, call us with questions. Return as needed or in 1 year for next physical.

## 2014-05-04 NOTE — Progress Notes (Signed)
Pre visit review using our clinic review tool, if applicable. No additional management support is needed unless otherwise documented below in the visit note. 

## 2014-05-07 ENCOUNTER — Other Ambulatory Visit (INDEPENDENT_AMBULATORY_CARE_PROVIDER_SITE_OTHER): Payer: BC Managed Care – PPO

## 2014-05-07 DIAGNOSIS — Z0000 Encounter for general adult medical examination without abnormal findings: Secondary | ICD-10-CM

## 2014-05-07 LAB — CBC WITH DIFFERENTIAL/PLATELET
Basophils Absolute: 0 10*3/uL (ref 0.0–0.1)
Basophils Relative: 0.7 % (ref 0.0–3.0)
EOS ABS: 0.2 10*3/uL (ref 0.0–0.7)
EOS PCT: 3.4 % (ref 0.0–5.0)
HCT: 40 % (ref 36.0–46.0)
Hemoglobin: 13.4 g/dL (ref 12.0–15.0)
LYMPHS ABS: 1.5 10*3/uL (ref 0.7–4.0)
Lymphocytes Relative: 30.3 % (ref 12.0–46.0)
MCHC: 33.4 g/dL (ref 30.0–36.0)
MCV: 94.3 fl (ref 78.0–100.0)
MONO ABS: 0.3 10*3/uL (ref 0.1–1.0)
Monocytes Relative: 6.7 % (ref 3.0–12.0)
NEUTROS PCT: 58.9 % (ref 43.0–77.0)
Neutro Abs: 3 10*3/uL (ref 1.4–7.7)
Platelets: 250 10*3/uL (ref 150.0–400.0)
RBC: 4.24 Mil/uL (ref 3.87–5.11)
RDW: 13.2 % (ref 11.5–15.5)
WBC: 5.1 10*3/uL (ref 4.0–10.5)

## 2014-05-07 LAB — COMPREHENSIVE METABOLIC PANEL
ALBUMIN: 4 g/dL (ref 3.5–5.2)
ALK PHOS: 53 U/L (ref 39–117)
ALT: 12 U/L (ref 0–35)
AST: 14 U/L (ref 0–37)
BUN: 11 mg/dL (ref 6–23)
CALCIUM: 9.3 mg/dL (ref 8.4–10.5)
CHLORIDE: 102 meq/L (ref 96–112)
CO2: 31 mEq/L (ref 19–32)
Creatinine, Ser: 0.7 mg/dL (ref 0.4–1.2)
GFR: 101.3 mL/min (ref >60.00)
Glucose, Bld: 81 mg/dL (ref 70–99)
POTASSIUM: 3.8 meq/L (ref 3.5–5.1)
SODIUM: 139 meq/L (ref 135–145)
TOTAL PROTEIN: 6.7 g/dL (ref 6.0–8.3)
Total Bilirubin: 1.1 mg/dL (ref 0.2–1.2)

## 2014-05-07 LAB — LIPID PANEL
Cholesterol: 211 mg/dL — ABNORMAL HIGH (ref 0–200)
HDL: 80 mg/dL (ref >39.00)
LDL CALC: 116 mg/dL — AB (ref 0–99)
Total CHOL/HDL Ratio: 3
Triglycerides: 73 mg/dL (ref 0.0–149.0)
VLDL: 14.6 mg/dL (ref 0.0–40.0)

## 2014-05-07 LAB — TSH: TSH: 1.14 u[IU]/mL (ref 0.35–4.50)

## 2014-07-16 LAB — LIPID PANEL
CHOLESTEROL: 229 mg/dL — AB (ref 0–200)
HDL: 89 mg/dL — AB (ref 35–70)
LDL (calc): 124
Triglycerides: 78

## 2014-07-16 LAB — GLUCOSE (CC13): Glucose: 78

## 2014-07-16 LAB — GLUCOSE, FASTING: Glucose: 78

## 2014-08-04 ENCOUNTER — Encounter: Payer: Self-pay | Admitting: *Deleted

## 2014-08-12 ENCOUNTER — Ambulatory Visit (INDEPENDENT_AMBULATORY_CARE_PROVIDER_SITE_OTHER): Payer: BC Managed Care – PPO | Admitting: Family Medicine

## 2014-08-12 ENCOUNTER — Encounter: Payer: Self-pay | Admitting: Family Medicine

## 2014-08-12 ENCOUNTER — Encounter: Payer: Self-pay | Admitting: *Deleted

## 2014-08-12 VITALS — BP 122/64 | HR 64 | Temp 98.2°F | Wt 178.5 lb

## 2014-08-12 DIAGNOSIS — E785 Hyperlipidemia, unspecified: Secondary | ICD-10-CM

## 2014-08-12 HISTORY — DX: Hyperlipidemia, unspecified: E78.5

## 2014-08-12 NOTE — Progress Notes (Signed)
Pre visit review using our clinic review tool, if applicable. No additional management support is needed unless otherwise documented below in the visit note. 

## 2014-08-12 NOTE — Assessment & Plan Note (Signed)
Discussed mildly elevated chol levels. HDL 89 - discussed counts as negative risk factor for cardiovascular disease. framingham today = <1%. Does have some famhx CAD. Discussed goal for her LDL< 130, ideally <100. Discussed dietary changes to improve LDL. Provided with low chol diet handout. Discussed no need for drug intervention at this time. Reassured overall low risk. Pt agrees with plan.

## 2014-08-12 NOTE — Progress Notes (Signed)
   BP 122/64  Pulse 64  Temp(Src) 98.2 F (36.8 C) (Oral)  Wt 178 lb 8 oz (80.967 kg)  LMP 07/17/2012   CC: discuss elevated chol from work  Subjective:    Patient ID: Katherine Steele, female    DOB: 16-Feb-1971, 43 y.o.   MRN: [id number]  HPI: Katherine Steele is a 43 y.o. female presenting on 08/12/2014 for Follow-up   Brings labwork from work showing TC 229, Trig 78, HDL 89 and LDL 124.  This was a fasting lab draw. Psychiatrist wanted me to address.  H/o mildly elevated chol levels when pt was 43 yo.  fmhx CAD - maternal grandfather.  Past Medical History  Diagnosis Date  . GAD (generalized anxiety disorder)     Dr. Lucianne Muss psychiatrist  . MDD (major depressive disorder)     Dr. Lucianne Muss psychiatrist  . Gender dysphoria 2014    did undergo 10 mo testosterone treatment  . Endometriosis     s/p hysterectomy  . HLD (hyperlipidemia) 08/12/2014    Mild off meds    Relevant past medical, surgical, family and social history reviewed and updated as indicated.  Allergies and medications reviewed and updated. Current Outpatient Prescriptions on File Prior to Visit  Medication Sig  . ARIPiprazole (ABILIFY) 5 MG tablet Take 5 mg by mouth daily.  Marland Kitchen FLUoxetine (PROZAC) 40 MG capsule Take 40 mg by mouth daily.  . Multiple Vitamin (MULTIVITAMIN) tablet Take 1 tablet by mouth daily.   No current facility-administered medications on file prior to visit.    Review of Systems Per HPI unless specifically indicated above    Objective:    BP 122/64  Pulse 64  Temp(Src) 98.2 F (36.8 C) (Oral)  Wt 178 lb 8 oz (80.967 kg)  LMP 07/17/2012  Physical Exam  Nursing note and vitals reviewed. Constitutional: She appears well-developed and well-nourished. No distress.  Psychiatric: She has a normal mood and affect.   Results for orders placed in visit on 08/04/14  LIPID PANEL      Result Value Ref Range   Cholesterol 229 (*) 0 - 200 mg/dL   Triglycerides 78     HDL 89 (*) 35 - 70 mg/dL   LDL  (calc) 937    GLUCOSE (CC13)      Result Value Ref Range   Glucose 78    GLUCOSE, FASTING      Result Value Ref Range   Glucose 78        Assessment & Plan:   Problem List Items Addressed This Visit   HLD (hyperlipidemia) - Primary     Discussed mildly elevated chol levels. HDL 89 - discussed counts as negative risk factor for cardiovascular disease. framingham today = <1%. Does have some famhx CAD. Discussed goal for her LDL< 130, ideally <100. Discussed dietary changes to improve LDL. Provided with low chol diet handout. Discussed no need for drug intervention at this time. Reassured overall low risk. Pt agrees with plan.        Follow up plan: Return if symptoms worsen or fail to improve.

## 2014-08-12 NOTE — Patient Instructions (Signed)
Cholesterol levels are mildly elevated but no need for medications. Remember - more fruits and vegetables, more fish, less red meat and dairy products.  More soy, nuts, beans, barley, lentils, oats and plant sterol ester enriched margarine instead of butter. Cholesterol diet handout provided - try to make small healthy changes in diet.

## 2014-10-18 ENCOUNTER — Encounter: Payer: Self-pay | Admitting: Family Medicine

## 2015-04-08 NOTE — Op Note (Signed)
PATIENT NAME:  Katherine Steele, HEADEN MR#:  [id number] DATE OF BIRTH:  23-Mar-1971  DATE OF PROCEDURE:  12/29/2012  PREOPERATIVE DIAGNOSES: 1. Chronic pelvic pain, midline and left lower quadrant.  2. Pelvic adhesive disease.  3. History of endometriosis.   POSTOPERATIVE DIAGNOSES:  1. Chronic pelvic pain, midline and left lower quadrant.  2. Pelvic adhesive disease.  3. History of endometriosis.  4. Left ovarian endometrioma.   OPERATIVE PROCEDURES: Exploratory laparotomy with total abdominal hysterectomy, left salpingo-oophorectomy and adhesiolysis.   SURGEON: Prentice Docker. Fredericka Bottcher, M.D.   FIRST ASSISTANTS: Cassell Clement, MD / PA Student Gerrit Friends  ANESTHESIA: General endotracheal.   INDICATIONS: The patient is a 44 year old married white female, para 1-0-1-1, with history of chronic pelvic pain, previous surgery in the form of cesarean section and laparotomy with right salpingo-oophorectomy secondary to endometriosis, who presents for definitive surgical treatment of her pain.   FINDINGS AT SURGERY: Dense pelvic adhesions involving the left adnexa, bowel and omentum. There were adhesions between the uterus and bowel which had to be lysed. There were dense bladder adhesions to the lower uterine segment. In addition, no omental adhesions upon entry had to be taken down in order to facilitate the procedure. The uterus appeared grossly normal. The left ovary had a chocolate cyst and hydrosalpinx which is consistent with findings of endometriosis.   DESCRIPTION OF PROCEDURE: The patient was brought to the operating room where she was placed in the supine position. General endotracheal anesthesia was induced without difficulty. A Foley catheter was placed and was draining clear yellow urine. After appropriate prep and drape, a Pfannenstiel incision was made into the abdomen. The previously identified scar was excised. The fascia was incised transversely and extended bilaterally with Mayo  scissors. The rectus muscle was dissected from the fascia through sharp and blunt dissection. The midline raphe was identified, separated and the peritoneum was entered. The adhesions between the anterior abdominal wall, on the left, and the omentum were taken down to facilitate exposure. The dense adhesions between the left adnexa and in the cul-de-sac region obliterating the cul-de-sac were then taken down through sharp and blunt dissection. Once anatomy was restored to somewhat normal appearance, the hysterectomy was then performed in the standard fashion. The round ligament on the left was clamped, cut and stick tied using 0 Vicryl. The posterior leaf of the broad ligament was opened. The infundibulopelvic ligament was isolated and then doubly clamped and cut. It was tied off with 0 Vicryl suture. The cardinal broad ligament complexes were then sequentially clamped, cut and stick tied going down towards the lower uterine segment. The anterior leaf of the broad ligament had been opened and the uterine vessels were skeletonized. A bladder flap was slowly created with sharp dissection. This was very tedious due to the extensive adhesions that were encountered. Care was taken to avoid the ureters along the pelvic sidewall. Once the remainder of the cardinal broad ligament complexes were taken down, to the level of the cervicovaginal junction, curved Haney clamps were used to crossclamp the cervix and excise the specimen. Richardson stitches were placed at the angles of the vagina. The intervening vaginal mucosa was closed with several figure-of-eight sutures of 0 Vicryl. Copious irrigation was then performed following closure of the cuff. Hemostasis was verified. All instrumentation was then removed from the pelvis including laps and retractors and the abdomen was then closed with 0 Maxon being used on the fascia, in a simple running manner. The skin was closed with a subcuticular stitch  of 3-0 Vicryl. Dermabond was  placed over the incision. Pressure dressing was applied. The patient was then awakened, extubated and taken to the recovery room in satisfactory condition. Estimated blood loss was 200 mL. Urine output was 250 mL of clear urine. IV fluid volume was not quantified at the time of this dictation. ____________________________ Prentice Docker Douglas Rooks, MD mad:sb D: 12/31/2012 08:08:20 ET T: 12/31/2012 10:06:36 ET JOB#: [id number]  cc: Daphine Deutscher A. Gweneth Fredlund, MD, <Dictator> Prentice Docker Deauna Yaw MD ELECTRONICALLY SIGNED 01/04/2013 22:34

## 2015-04-10 DIAGNOSIS — G4700 Insomnia, unspecified: Secondary | ICD-10-CM | POA: Insufficient documentation

## 2015-06-02 ENCOUNTER — Ambulatory Visit (INDEPENDENT_AMBULATORY_CARE_PROVIDER_SITE_OTHER): Payer: 59 | Admitting: Psychiatry

## 2015-06-02 VITALS — BP 102/68 | HR 65

## 2015-06-02 DIAGNOSIS — F641 Gender identity disorder in adolescence and adulthood: Secondary | ICD-10-CM

## 2015-06-02 DIAGNOSIS — F330 Major depressive disorder, recurrent, mild: Secondary | ICD-10-CM | POA: Diagnosis not present

## 2015-06-02 DIAGNOSIS — F640 Transsexualism: Secondary | ICD-10-CM

## 2015-06-02 MED ORDER — FLUOXETINE HCL 40 MG PO CAPS: 40.0000 mg | ORAL_CAPSULE | Freq: Every day | ORAL | Status: DC

## 2015-06-02 NOTE — Progress Notes (Signed)
BH MD/PA/NP OP Progress Note  06/02/2015 3:42 PM Katherine Steele  MRN:  [id number]  Subjective:    Patient is a 44 year old female with gender dysphoric disorder. She currently is married and lives with her husband and 72 year old son. She reported that she was feeling very depressed last month and was having passive suicidal ideation but she did not acted on the same. She reported that she had a big fight with her husband who does not understand her gender dysphoria. However she is stable now and Prozac really helped her. She reported that she is  able to get over those feelings. She reported that she has a very supportive husband who tries to help her and she does not want to leave him at this time. She reported that she has thought about her feelings in detail and she wants to stay in the same relationship. She reported that now she is trying to be more assertive and will not follow her dream. She currently denied having any suicidal ideations or plans. She denied having any perceptual disturbances. She is currently compliant with her medications.   Chief Complaint:  Chief Complaint    Follow-up     Visit Diagnosis:     ICD-9-CM ICD-10-CM   1. Major depressive disorder, recurrent episode, mild 296.31 F33.0   2. Gender dysphoria in adolescent and adult 302.85 F64.1     Past Medical History:  Past Medical History  Diagnosis Date  . GAD (generalized anxiety disorder)     Dr. Lucianne Muss psychiatrist  . MDD (major depressive disorder)     Dr. Lucianne Muss psychiatrist  . Gender dysphoria 2014    did undergo 10 mo testosterone treatment  . Endometriosis     s/p hysterectomy  . HLD (hyperlipidemia) 08/12/2014    Mild off meds     Past Surgical History  Procedure Laterality Date  . Tonsillectomy  1975  . Oophorectomy Left 2006  . Cesarean section    . Total abdominal hysterectomy  12/2012    endometriosis and ovarian cysts s/p ovaries removed  . Tympanostomy tube placement Bilateral 1976  .  Finger surgery Right 2007    little finger  . Colonoscopy  2007    WNL [facility])   Family History:  Family History  Problem Relation Age of Onset  . Diabetes Mother   . Hypertension Father   . Alcohol abuse Father     history  . CAD Maternal Grandfather 60    MI  . Cancer Paternal Aunt     breast  . Stroke Neg Hx   . CAD Maternal Uncle 70    MI  . Autism spectrum disorder Son    Social History:  History   Social History  . Marital Status: Married    Spouse Name: N/A  . Number of Children: N/A  . Years of Education: N/A   Social History Main Topics  . Smoking status: Former Smoker    Types: Cigarettes    Quit date: 02/06/1993  . Smokeless tobacco: Never Used  . Alcohol Use: Yes     Comment: social/weekends  . Drug Use: No  . Sexual Activity: Yes   Other Topics Concern  . Not on file   Social History Narrative   Lives with husband [name]) and son [name])   Occupation: Lexicographer   Edu: Associate's degree   Activity: exercise 3x/wk   Diet: good water, fruits/vegetables some   Additional History: She stated that her 15 year old son is very much  attached to her and she does not want to hurt his feelings.  Assessment:   Musculoskeletal: Strength & Muscle Tone: within normal limits Gait & Station: normal Patient leans: N/A  Psychiatric Specialty Exam: HPI  Review of Systems  Constitutional: Negative for weight loss.  HENT: Negative for hearing loss and nosebleeds.   Eyes: Negative for photophobia.  Respiratory: Negative for hemoptysis.   Gastrointestinal: Negative for vomiting.  Genitourinary: Negative for urgency.  Musculoskeletal: Negative for neck pain.  Neurological: Negative for tremors.  Endo/Heme/Allergies: Negative for environmental allergies.  Psychiatric/Behavioral: Positive for depression. The patient is nervous/anxious.     Last menstrual period 07/17/2012.There is no weight on file to calculate BMI.  General Appearance:  Casual  Eye Contact:  Fair  Speech:  Clear and Coherent  Volume:  Decreased  Mood:  Depressed  Affect:  Constricted  Thought Process:  Goal Directed  Orientation:  Full (Time, Place, and Person)  Thought Content:  WDL  Suicidal Thoughts:  No  Homicidal Thoughts:  No  Memory:  Immediate;   Fair  Judgement:  Fair  Insight:  Fair  Psychomotor Activity:  Psychomotor Retardation  Concentration:  Fair  Recall:  [employer] of Knowledge: Fair  Language: Fair  Akathisia:  No  Handed:  Right  AIMS (if indicated):  none  Assets:  Manufacturing systems engineer Physical Health Social Support  ADL's:  Intact  Cognition: WNL  Sleep:  6-8   Is the patient at risk to self?  No. Has the patient been a risk to self in the past 6 months?  No. Has the patient been a risk to self within the distant past?  No. Is the patient a risk to others?  No. Has the patient been a risk to others in the past 6 months?  No. Has the patient been a risk to others within the distant past?  No.  Current Medications: Current Outpatient Prescriptions  Medication Sig Dispense Refill  . amphetamine-dextroamphetamine (ADDERALL XR) 20 MG 24 hr capsule Take 20 mg by mouth daily.    . ARIPiprazole (ABILIFY) 5 MG tablet Take 5 mg by mouth daily.    Marland Kitchen FLUoxetine (PROZAC) 40 MG capsule Take 40 mg by mouth daily.    . Multiple Vitamin (MULTIVITAMIN) tablet Take 1 tablet by mouth daily.     No current facility-administered medications for this visit.    Medical Decision Making:  Established Problem, Stable/Improving (1)  Treatment Plan Summary:Medication management   Discussed with patient at length about the medications treatment risk benefits and alternatives and she wants to continue on Prozac at this time She currently denied having any suicidal ideations or plans. She will follow up in 3 months or earlier depending on her symptoms She is also seeing a therapist on a regular basis   More than 50% of the time spent in  psychoeducation, counseling and coordination of care.    This note was generated in part or whole with voice recognition software. Voice regonition is usually quite accurate but there are transcription errors that can and very often do occur. I apologize for any typographical errors that were not detected and corrected.    Brandy Hale 06/02/2015, 3:42 PM

## 2015-08-24 ENCOUNTER — Encounter: Payer: Self-pay | Admitting: Family Medicine

## 2015-08-24 ENCOUNTER — Ambulatory Visit (INDEPENDENT_AMBULATORY_CARE_PROVIDER_SITE_OTHER): Payer: 59 | Admitting: Family Medicine

## 2015-08-24 VITALS — BP 110/60 | HR 60 | Temp 97.9°F | Ht 68.5 in | Wt 198.2 lb

## 2015-08-24 DIAGNOSIS — Z0000 Encounter for general adult medical examination without abnormal findings: Secondary | ICD-10-CM

## 2015-08-24 DIAGNOSIS — F649 Gender identity disorder, unspecified: Secondary | ICD-10-CM

## 2015-08-24 DIAGNOSIS — Z23 Encounter for immunization: Secondary | ICD-10-CM | POA: Diagnosis not present

## 2015-08-24 DIAGNOSIS — F330 Major depressive disorder, recurrent, mild: Secondary | ICD-10-CM

## 2015-08-24 DIAGNOSIS — F411 Generalized anxiety disorder: Secondary | ICD-10-CM

## 2015-08-24 DIAGNOSIS — E785 Hyperlipidemia, unspecified: Secondary | ICD-10-CM

## 2015-08-24 LAB — LIPID PANEL
Cholesterol: 252 mg/dL — ABNORMAL HIGH (ref 0–200)
HDL: 71.1 mg/dL (ref >39.00)
LDL Cholesterol: 158 mg/dL — ABNORMAL HIGH (ref 0–99)
NonHDL: 181.25
Total CHOL/HDL Ratio: 4
Triglycerides: 114 mg/dL (ref 0.0–149.0)
VLDL: 22.8 mg/dL (ref 0.0–40.0)

## 2015-08-24 LAB — BASIC METABOLIC PANEL
BUN: 13 mg/dL (ref 6–23)
CHLORIDE: 103 meq/L (ref 96–112)
CO2: 33 meq/L — AB (ref 19–32)
Calcium: 9.7 mg/dL (ref 8.4–10.5)
Creatinine, Ser: 0.65 mg/dL (ref 0.40–1.20)
GFR: 104.3 mL/min (ref >60.00)
Glucose, Bld: 75 mg/dL (ref 70–99)
POTASSIUM: 5.2 meq/L — AB (ref 3.5–5.1)
SODIUM: 140 meq/L (ref 135–145)

## 2015-08-24 MED ORDER — FLUOXETINE HCL 40 MG PO CAPS: 40.0000 mg | ORAL_CAPSULE | Freq: Every day | ORAL | Status: DC

## 2015-08-24 NOTE — Progress Notes (Signed)
Pre visit review using our clinic review tool, if applicable. No additional management support is needed unless otherwise documented below in the visit note. 

## 2015-08-24 NOTE — Assessment & Plan Note (Signed)
Preventative protocols reviewed and updated unless pt declined. Discussed healthy diet and lifestyle.  

## 2015-08-24 NOTE — Assessment & Plan Note (Signed)
Recheck today. 

## 2015-08-24 NOTE — Progress Notes (Signed)
BP 110/60 mmHg  Pulse 60  Temp(Src) 97.9 F (36.6 C) (Oral)  Ht 5' 8.5" (1.74 m)  Wt 198 lb 4 oz (89.926 kg)  BMI 29.70 kg/m2  LMP 07/17/2012   CC: CPE  Subjective:    Patient ID: Katherine Steele, female    DOB: Aug 13, 1971, 45 y.o.   MRN: [id number]  HPI: Katherine Steele is a 44 y.o. female presenting on 08/24/2015 for Annual Exam   S/p total hysterectomy with oophorectomy. Discussed HRT with OBGYN - but initially decided to undergo gender transformation treatment with testosterone for about 8-10 months, then stopped this.Saw Dr. Ruby Cola (GYN) who initially placed on testosterone therapy. Then on estradiol 1mg  daily for hot flashes (02/2014).Decided to stop testosterone 2/2 family situation/concerns. May consider further transformation in the future.   H/o depression, anxiety and gender dysphoria. previously [facility]also saw psychiatrist Dr. Rober Minion at [facility] - but she left. Has been seeing Dr [facility] - not happy with new doc. Requests we start prescribing. Currently on prozac 40mg  daily. Feels doing well from this standpoint.   Preventative: COLONOSCOPY Date: 2007 WNL [facility]) Well woman - OBGYN Dr. 2008 last saw 08/2012.S/p total hysterectomy. Mammogram - has not had. No fmhx breast cancer. Pt declines. Flu shot - yearly Tdap 08/2005. Requests updated today. Seat belt use 100% Sunscreen use discussed, no sunburns in past year, no changing moles.  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 Interim medical history since our last visit reviewed. Allergies and medications reviewed and updated. Current Outpatient Prescriptions on File Prior to Visit  Medication Sig  . Multiple Vitamin (MULTIVITAMIN)  tablet Take 1 tablet by mouth daily.   No current facility-administered medications on file prior to visit.    Review of Systems  Constitutional: Negative for fever, chills, activity change, appetite change, fatigue and unexpected weight change.  HENT: Negative for hearing loss.   Eyes: Negative for visual disturbance.  Respiratory: Negative for cough, chest tightness, shortness of breath and wheezing.   Cardiovascular: Negative for chest pain, palpitations and leg swelling.  Gastrointestinal: Negative for nausea, vomiting, abdominal pain, diarrhea, constipation, blood in stool and abdominal distention.  Genitourinary: Negative for hematuria and difficulty urinating.  Musculoskeletal: Negative for myalgias, arthralgias and neck pain.  Skin: Negative for rash.  Neurological: Negative for dizziness, seizures, syncope and headaches.  Hematological: Negative for adenopathy. Does not bruise/bleed easily.  Psychiatric/Behavioral: Negative for dysphoric mood. The patient is not nervous/anxious.    Per HPI unless specifically indicated above     Objective:    BP 110/60 mmHg  Pulse 60  Temp(Src) 97.9 F (36.6 C) (Oral)  Ht 5' 8.5" (1.74 m)  Wt 198 lb 4 oz (89.926 kg)  BMI 29.70 kg/m2  LMP 07/17/2012  Wt Readings from Last 3 Encounters:  08/24/15 198 lb 4 oz (89.926 kg)  08/12/14 178 lb 8 oz (80.967 kg)  05/04/14 172 lb (78.019 kg)    Physical Exam  Constitutional: She is oriented to person, place, and time. She appears well-developed and well-nourished. No distress.  HENT:  Head: Normocephalic and atraumatic.  Right Ear: Hearing, tympanic membrane, external ear and ear canal normal.  Left Ear: Hearing, tympanic membrane, external ear and ear canal normal.  Nose: Nose normal.  Mouth/Throat: Uvula is midline, oropharynx is clear  and moist and mucous membranes are normal. No oropharyngeal exudate, posterior oropharyngeal edema or posterior oropharyngeal erythema.  Eyes: Conjunctivae  and EOM are normal. Pupils are equal, round, and reactive to light. No scleral icterus.  Neck: Normal range of motion. Neck supple. No thyromegaly present.  Cardiovascular: Normal rate, regular rhythm, normal heart sounds and intact distal pulses.   No murmur heard. Pulses:      Radial pulses are 2+ on the right side, and 2+ on the left side.  Pulmonary/Chest: Effort normal and breath sounds normal. No respiratory distress. She has no wheezes. She has no rales.  Abdominal: Soft. Bowel sounds are normal. She exhibits no distension and no mass. There is no tenderness. There is no rebound and no guarding.  Musculoskeletal: Normal range of motion. She exhibits no edema.  Lymphadenopathy:    She has no cervical adenopathy.  Neurological: She is alert and oriented to person, place, and time.  CN grossly intact, station and gait intact  Skin: Skin is warm and dry. No rash noted.  Psychiatric: She has a normal mood and affect. Her behavior is normal. Judgment and thought content normal.  Nursing note and vitals reviewed.  Results for orders placed or performed in visit on 08/04/14  Lipid panel  Result Value Ref Range   Cholesterol 229 (A) 0 - 200 mg/dL   Triglycerides 78    HDL 89 (A) 35 - 70 mg/dL   LDL (calc) 242   Glucose  Result Value Ref Range   Glucose 78   Glucose, fasting  Result Value Ref Range   Glucose 78       Assessment & Plan:   Problem List Items Addressed This Visit    Generalized anxiety disorder    Stable on prozac. Requests we refill.       MDD (major depressive disorder)    Chronic, stable on prozac. Requests we refill.       Relevant Medications   FLUoxetine (PROZAC) 40 MG capsule   Gender dysphoria    Off all HRT.      Health maintenance examination - Primary    Preventative protocols reviewed and updated unless pt declined. Discussed healthy diet and lifestyle.       HLD (hyperlipidemia)    Recheck today.      Relevant Orders   Lipid panel    Basic metabolic panel       Follow up plan: Return in about 6 months (around 02/21/2016), or as needed, for follow up visit.

## 2015-08-24 NOTE — Addendum Note (Signed)
Addended by: Baldomero Lamy on: 08/24/2015 02:18 PM   Modules accepted: Kipp Brood

## 2015-08-24 NOTE — Assessment & Plan Note (Signed)
Off all HRT.

## 2015-08-24 NOTE — Assessment & Plan Note (Signed)
Chronic, stable on prozac. Requests we refill.

## 2015-08-24 NOTE — Addendum Note (Signed)
Addended by: Josph Macho A on: 08/24/2015 08:16 AM   Modules accepted: Orders

## 2015-08-24 NOTE — Assessment & Plan Note (Signed)
Stable on prozac. Requests we refill.

## 2015-08-24 NOTE — Patient Instructions (Addendum)
Flu shot today.  Tetanus shot today (Td).  We will continue to fill prozac.  labwork today.  Return in 6 months for follow up visit.  Health Maintenance Adopting a healthy lifestyle and getting preventive care can go a long way to promote health and wellness. Talk with your health care provider about what schedule of regular examinations is right for you. This is a good chance for you to check in with your provider about disease prevention and staying healthy. In between checkups, there are plenty of things you can do on your own. Experts have done a lot of research about which lifestyle changes and preventive measures are most likely to keep you healthy. Ask your health care provider for more information. WEIGHT AND DIET  Eat a healthy diet  Be sure to include plenty of vegetables, fruits, low-fat dairy products, and lean protein.  Do not eat a lot of foods high in solid fats, added sugars, or salt.  Get regular exercise. This is one of the most important things you can do for your health.  Most adults should exercise for at least 150 minutes each week. The exercise should increase your heart rate and make you sweat (moderate-intensity exercise).  Most adults should also do strengthening exercises at least twice a week. This is in addition to the moderate-intensity exercise.  Maintain a healthy weight  Body mass index (BMI) is a measurement that can be used to identify possible weight problems. It estimates body fat based on height and weight. Your health care provider can help determine your BMI and help you achieve or maintain a healthy weight.  For females 62 years of age and older:   A BMI below 18.5 is considered underweight.  A BMI of 18.5 to 24.9 is normal.  A BMI of 25 to 29.9 is considered overweight.  A BMI of 30 and above is considered obese.  Watch levels of cholesterol and blood lipids  You should start having your blood tested for lipids and cholesterol at 46  years of age, then have this test every 5 years.  You may need to have your cholesterol levels checked more often if:  Your lipid or cholesterol levels are high.  You are older than 46 years of age.  You are at high risk for heart disease.  CANCER SCREENING   Lung Cancer  Lung cancer screening is recommended for adults 76-41 years old who are at high risk for lung cancer because of a history of smoking.  A yearly low-dose CT scan of the lungs is recommended for people who:  Currently smoke.  Have quit within the past 15 years.  Have at least a 30-pack-year history of smoking. A pack year is smoking an average of one pack of cigarettes a day for 1 year.  Yearly screening should continue until it has been 15 years since you quit.  Yearly screening should stop if you develop a health problem that would prevent you from having lung cancer treatment.  Breast Cancer  Practice breast self-awareness. This means understanding how your breasts normally appear and feel.  It also means doing regular breast self-exams. Let your health care provider know about any changes, no matter how small.  If you are in your 20s or 30s, you should have a clinical breast exam (CBE) by a health care provider every 1-3 years as part of a regular health exam.  If you are 19 or older, have a CBE every year. Also consider having a  breast X-ray (mammogram) every year.  If you have a family history of breast cancer, talk to your health care provider about genetic screening.  If you are at high risk for breast cancer, talk to your health care provider about having an MRI and a mammogram every year.  Breast cancer gene (BRCA) assessment is recommended for women who have family members with BRCA-related cancers. BRCA-related cancers include:  Breast.  Ovarian.  Tubal.  Peritoneal cancers.  Results of the assessment will determine the need for genetic counseling and BRCA1 and BRCA2 testing. Cervical  Cancer Routine pelvic examinations to screen for cervical cancer are no longer recommended for nonpregnant women who are considered low risk for cancer of the pelvic organs (ovaries, uterus, and vagina) and who do not have symptoms. A pelvic examination may be necessary if you have symptoms including those associated with pelvic infections. Ask your health care provider if a screening pelvic exam is right for you.   The Pap test is the screening test for cervical cancer for women who are considered at risk.  If you had a hysterectomy for a problem that was not cancer or a condition that could lead to cancer, then you no longer need Pap tests.  If you are older than 65 years, and you have had normal Pap tests for the past 10 years, you no longer need to have Pap tests.  If you have had past treatment for cervical cancer or a condition that could lead to cancer, you need Pap tests and screening for cancer for at least 20 years after your treatment.  If you no longer get a Pap test, assess your risk factors if they change (such as having a new sexual partner). This can affect whether you should start being screened again.  Some women have medical problems that increase their chance of getting cervical cancer. If this is the case for you, your health care provider may recommend more frequent screening and Pap tests.  The human papillomavirus (HPV) test is another test that may be used for cervical cancer screening. The HPV test looks for the virus that can cause cell changes in the cervix. The cells collected during the Pap test can be tested for HPV.  The HPV test can be used to screen women 58 years of age and older. Getting tested for HPV can extend the interval between normal Pap tests from three to five years.  An HPV test also should be used to screen women of any age who have unclear Pap test results.  After 46 years of age, women should have HPV testing as often as Pap tests.  Colorectal  Cancer  This type of cancer can be detected and often prevented.  Routine colorectal cancer screening usually begins at 46 years of age and continues through 46 years of age.  Your health care provider may recommend screening at an earlier age if you have risk factors for colon cancer.  Your health care provider may also recommend using home test kits to check for hidden blood in the stool.  A small camera at the end of a tube can be used to examine your colon directly (sigmoidoscopy or colonoscopy). This is done to check for the earliest forms of colorectal cancer.  Routine screening usually begins at age 71.  Direct examination of the colon should be repeated every 5-10 years through 46 years of age. However, you may need to be screened more often if early forms of precancerous polyps or  small growths are found. Skin Cancer  Check your skin from head to toe regularly.  Tell your health care provider about any new moles or changes in moles, especially if there is a change in a mole's shape or color.  Also tell your health care provider if you have a mole that is larger than the size of a pencil eraser.  Always use sunscreen. Apply sunscreen liberally and repeatedly throughout the day.  Protect yourself by wearing long sleeves, pants, a wide-brimmed hat, and sunglasses whenever you are outside. HEART DISEASE, DIABETES, AND HIGH BLOOD PRESSURE   Have your blood pressure checked at least every 1-2 years. High blood pressure causes heart disease and increases the risk of stroke.  If you are between 81 years and 50 years old, ask your health care provider if you should take aspirin to prevent strokes.  Have regular diabetes screenings. This involves taking a blood sample to check your fasting blood sugar level.  If you are at a normal weight and have a low risk for diabetes, have this test once every three years after 46 years of age.  If you are overweight and have a high risk for  diabetes, consider being tested at a younger age or more often. PREVENTING INFECTION  Hepatitis B  If you have a higher risk for hepatitis B, you should be screened for this virus. You are considered at high risk for hepatitis B if:  You were born in a country where hepatitis B is common. Ask your health care provider which countries are considered high risk.  Your parents were born in a high-risk country, and you have not been immunized against hepatitis B (hepatitis B vaccine).  You have HIV or AIDS.  You use needles to inject street drugs.  You live with someone who has hepatitis B.  You have had sex with someone who has hepatitis B.  You get hemodialysis treatment.  You take certain medicines for conditions, including cancer, organ transplantation, and autoimmune conditions. Hepatitis C  Blood testing is recommended for:  Everyone born from [birth year] through 1967.  Anyone with known risk factors for hepatitis C. Sexually transmitted infections (STIs)  You should be screened for sexually transmitted infections (STIs) including gonorrhea and chlamydia if:  You are sexually active and are younger than 46 years of age.  You are older than 46 years of age and your health care provider tells you that you are at risk for this type of infection.  Your sexual activity has changed since you were last screened and you are at an increased risk for chlamydia or gonorrhea. Ask your health care provider if you are at risk.  If you do not have HIV, but are at risk, it may be recommended that you take a prescription medicine daily to prevent HIV infection. This is called pre-exposure prophylaxis (PrEP). You are considered at risk if:  You are sexually active and do not regularly use condoms or know the HIV status of your partner(s).  You take drugs by injection.  You are sexually active with a partner who has HIV. Talk with your health care provider about whether you are at high risk of  being infected with HIV. If you choose to begin PrEP, you should first be tested for HIV. You should then be tested every 3 months for as long as you are taking PrEP.  PREGNANCY   If you are premenopausal and you may become pregnant, ask your health care provider about preconception  counseling.  If you may become pregnant, take 400 to 800 micrograms (mcg) of folic acid every day.  If you want to prevent pregnancy, talk to your health care provider about birth control (contraception). OSTEOPOROSIS AND MENOPAUSE   Osteoporosis is a disease in which the bones lose minerals and strength with aging. This can result in serious bone fractures. Your risk for osteoporosis can be identified using a bone density scan.  If you are 43 years of age or older, or if you are at risk for osteoporosis and fractures, ask your health care provider if you should be screened.  Ask your health care provider whether you should take a calcium or vitamin D supplement to lower your risk for osteoporosis.  Menopause may have certain physical symptoms and risks.  Hormone replacement therapy may reduce some of these symptoms and risks. Talk to your health care provider about whether hormone replacement therapy is right for you.  HOME CARE INSTRUCTIONS   Schedule regular health, dental, and eye exams.  Stay current with your immunizations.   Do not use any tobacco products including cigarettes, chewing tobacco, or electronic cigarettes.  If you are pregnant, do not drink alcohol.  If you are breastfeeding, limit how much and how often you drink alcohol.  Limit alcohol intake to no more than 1 drink per day for nonpregnant women. One drink equals 12 ounces of beer, 5 ounces of wine, or 1 ounces of hard liquor.  Do not use street drugs.  Do not share needles.  Ask your health care provider for help if you need support or information about quitting drugs.  Tell your health care provider if you often feel  depressed.  Tell your health care provider if you have ever been abused or do not feel safe at home. Document Released: 06/18/2011 Document Revised: 04/19/2014 Document Reviewed: 11/04/2013 [facility] Patient Information 2015 [place], [place]. This information is not intended to replace advice given to you by your health care provider. Make sure you discuss any questions you have with your health care provider.

## 2015-09-06 ENCOUNTER — Ambulatory Visit: Payer: Self-pay | Admitting: Psychiatry

## 2015-11-16 NOTE — Progress Notes (Signed)
This was a reorder from 08-24-15- still taking

## 2016-02-15 ENCOUNTER — Encounter: Payer: Self-pay | Admitting: Family Medicine

## 2016-02-15 ENCOUNTER — Ambulatory Visit (INDEPENDENT_AMBULATORY_CARE_PROVIDER_SITE_OTHER): Payer: 59 | Admitting: Family Medicine

## 2016-02-15 VITALS — BP 114/60 | HR 72 | Temp 98.7°F | Wt 209.8 lb

## 2016-02-15 DIAGNOSIS — F649 Gender identity disorder, unspecified: Secondary | ICD-10-CM

## 2016-02-15 DIAGNOSIS — J069 Acute upper respiratory infection, unspecified: Secondary | ICD-10-CM | POA: Diagnosis not present

## 2016-02-15 DIAGNOSIS — F3341 Major depressive disorder, recurrent, in partial remission: Secondary | ICD-10-CM

## 2016-02-15 NOTE — Assessment & Plan Note (Signed)
Off HRT.

## 2016-02-15 NOTE — Patient Instructions (Addendum)
You are doing well today. Continue mucous relief with water to help mobilize mucous.  Let us know if fever >101, worsening productive cough.

## 2016-02-15 NOTE — Progress Notes (Signed)
Pre visit review using our clinic review tool, if applicable. No additional management support is needed unless otherwise documented below in the visit note. 

## 2016-02-15 NOTE — Progress Notes (Signed)
BP 114/60 mmHg  Pulse 72  Temp(Src) 98.7 F (37.1 C) (Oral)  Wt 209 lb 12 oz (95.142 kg)  LMP 07/17/2012   CC: 6 mo f/u visit  Subjective:    Patient ID: Katherine Steele, female    DOB: 1971/12/13, 45 y.o.   MRN: [id number]  HPI: Katherine Steele is a 45 y.o. female presenting on 02/15/2016 for Follow-up   See prior note for details. H/o depression, anxiety and gender dysphoria. Previously saw counselor and psychiatrist Dr Lucianne Muss -> transitioned to Dr Arnold Long, but at last visit requested we start prescribing prozac 40mg  daily. Requests to continu ethis. Did receive HRT for 8-10 months but not recently.   10d h/o throat fullness sensation that progressed to mild chest congestion and cough. Staying fatigued. Mild PNdrainage and R ear fullness. Last night started feeling better. Has been taking OTC mucous relief. No fever/chills, headaches, sinus pressure, head congestion or rhinorrhea. Not around sick contacts. Not around smokers. No h/o asthma.   Relevant past medical, surgical, family and social history reviewed and updated as indicated. Interim medical history since our last visit reviewed. Allergies and medications reviewed and updated. Current Outpatient Prescriptions on File Prior to Visit  Medication Sig  . FLUoxetine (PROZAC) 40 MG capsule Take 1 capsule (40 mg total) by mouth daily.  . Multiple Vitamin (MULTIVITAMIN) tablet Take 1 tablet by mouth daily.   No current facility-administered medications on file prior to visit.    Review of Systems Per HPI unless specifically indicated in ROS section     Objective:    BP 114/60 mmHg  Pulse 72  Temp(Src) 98.7 F (37.1 C) (Oral)  Wt 209 lb 12 oz (95.142 kg)  LMP 07/17/2012  Wt Readings from Last 3 Encounters:  02/15/16 209 lb 12 oz (95.142 kg)  08/24/15 198 lb 4 oz (89.926 kg)  08/12/14 178 lb 8 oz (80.967 kg)    Physical Exam  Constitutional: She appears well-developed and well-nourished. No distress.  HENT:  Head:  Normocephalic and atraumatic.  Right Ear: Hearing, tympanic membrane, external ear and ear canal normal.  Left Ear: Hearing, tympanic membrane, external ear and ear canal normal.  Nose: No mucosal edema or rhinorrhea. Right sinus exhibits no maxillary sinus tenderness and no frontal sinus tenderness. Left sinus exhibits no maxillary sinus tenderness and no frontal sinus tenderness.  Mouth/Throat: Uvula is midline, oropharynx is clear and moist and mucous membranes are normal. No oropharyngeal exudate, posterior oropharyngeal edema, posterior oropharyngeal erythema or tonsillar abscesses.  Eyes: Conjunctivae and EOM are normal. Pupils are equal, round, and reactive to light. No scleral icterus.  Neck: Normal range of motion. Neck supple.  Cardiovascular: Normal rate, regular rhythm, normal heart sounds and intact distal pulses.   No murmur heard. Pulmonary/Chest: Effort normal and breath sounds normal. No respiratory distress. She has no wheezes. She has no rales.  Lymphadenopathy:    She has no cervical adenopathy.  Skin: Skin is warm and dry. No rash noted.  Psychiatric: She has a normal mood and affect. Her speech is normal and behavior is normal.  Nursing note and vitals reviewed.      Assessment & Plan:   Problem List Items Addressed This Visit    Viral URI - Primary    Reassured. Already resolving. Continue current treatment plan.      MDD (major depressive disorder) (HCC)    Chronic, stable. Continue prozac. RTC 6 mo CPE.       Gender dysphoria  Off HRT.           Follow up plan: Return in about 6 months (around 08/17/2016), or as needed, for annual exam, prior fasting for blood work.

## 2016-02-15 NOTE — Assessment & Plan Note (Signed)
Reassured. Already resolving. Continue current treatment plan.

## 2016-02-15 NOTE — Assessment & Plan Note (Signed)
Chronic, stable. Continue prozac. RTC 6 mo CPE.

## 2016-08-29 ENCOUNTER — Other Ambulatory Visit: Payer: Self-pay | Admitting: Family Medicine

## 2016-09-11 ENCOUNTER — Other Ambulatory Visit: Payer: Self-pay | Admitting: Family Medicine

## 2016-09-11 DIAGNOSIS — E785 Hyperlipidemia, unspecified: Secondary | ICD-10-CM

## 2016-09-12 ENCOUNTER — Other Ambulatory Visit (INDEPENDENT_AMBULATORY_CARE_PROVIDER_SITE_OTHER): Payer: 59

## 2016-09-12 DIAGNOSIS — E785 Hyperlipidemia, unspecified: Secondary | ICD-10-CM

## 2016-09-12 LAB — LIPID PANEL
CHOLESTEROL: 249 mg/dL — AB (ref 0–200)
HDL: 60.1 mg/dL (ref >39.00)
LDL Cholesterol: 170 mg/dL — ABNORMAL HIGH (ref 0–99)
NonHDL: 188.56
TRIGLYCERIDES: 94 mg/dL (ref 0.0–149.0)
Total CHOL/HDL Ratio: 4
VLDL: 18.8 mg/dL (ref 0.0–40.0)

## 2016-09-12 LAB — BASIC METABOLIC PANEL
BUN: 12 mg/dL (ref 6–23)
CO2: 32 meq/L (ref 19–32)
Calcium: 9.5 mg/dL (ref 8.4–10.5)
Chloride: 101 mEq/L (ref 96–112)
Creatinine, Ser: 0.67 mg/dL (ref 0.40–1.20)
GFR: 100.25 mL/min (ref >60.00)
GLUCOSE: 84 mg/dL (ref 70–99)
POTASSIUM: 4.2 meq/L (ref 3.5–5.1)
SODIUM: 139 meq/L (ref 135–145)

## 2016-09-12 LAB — TSH: TSH: 2.23 u[IU]/mL (ref 0.35–4.50)

## 2016-09-19 ENCOUNTER — Encounter: Payer: Self-pay | Admitting: Family Medicine

## 2016-09-19 ENCOUNTER — Ambulatory Visit (INDEPENDENT_AMBULATORY_CARE_PROVIDER_SITE_OTHER): Payer: 59 | Admitting: Family Medicine

## 2016-09-19 VITALS — BP 118/80 | HR 64 | Temp 99.0°F | Ht 68.5 in | Wt 210.2 lb

## 2016-09-19 DIAGNOSIS — F640 Transsexualism: Secondary | ICD-10-CM

## 2016-09-19 DIAGNOSIS — E7800 Pure hypercholesterolemia, unspecified: Secondary | ICD-10-CM | POA: Diagnosis not present

## 2016-09-19 DIAGNOSIS — M6530 Trigger finger, unspecified finger: Secondary | ICD-10-CM | POA: Insufficient documentation

## 2016-09-19 DIAGNOSIS — Z0001 Encounter for general adult medical examination with abnormal findings: Secondary | ICD-10-CM

## 2016-09-19 DIAGNOSIS — F3341 Major depressive disorder, recurrent, in partial remission: Secondary | ICD-10-CM | POA: Diagnosis not present

## 2016-09-19 DIAGNOSIS — Z23 Encounter for immunization: Secondary | ICD-10-CM | POA: Diagnosis not present

## 2016-09-19 DIAGNOSIS — R160 Hepatomegaly, not elsewhere classified: Secondary | ICD-10-CM | POA: Insufficient documentation

## 2016-09-19 HISTORY — DX: Trigger finger, unspecified finger: M65.30

## 2016-09-19 MED ORDER — FLUOXETINE HCL 20 MG PO CAPS: 20.0000 mg | ORAL_CAPSULE | Freq: Every day | ORAL | 6 refills | Status: DC

## 2016-09-19 NOTE — Assessment & Plan Note (Signed)
Stable. After work accident with cut tendon s/p surgery.

## 2016-09-19 NOTE — Assessment & Plan Note (Signed)
Stable period, desires to try and taper off prozac. Will decrease to 20mg  capsules.

## 2016-09-19 NOTE — Progress Notes (Addendum)
BP 118/80   Pulse 64   Temp 99 F (37.2 C) (Oral)   Ht 5' 8.5" (1.74 m)   Wt 210 lb 4 oz (95.4 kg)   LMP 07/17/2012   BMI 31.50 kg/m    CC: CPE Subjective:    Patient ID: Katherine Steele, female    DOB: 20-Dec-1970, 45 y.o.   MRN: [id number]  HPI: Katherine Steele is a 45 y.o. female presenting on 09/19/2016 for Annual Exam   H/o depression, anxiety and gender dysphoria. previously saw counselor at [facility]ion).also saw psychiatrist Dr. Lucianne Muss at [facility] - but she left. Saw Dr Arnold Long - not happy with new doc. Requested we start prescribing prozac, currently on 40mg  daily. More involved in church which has been extremely positive. Wonders about decreasing prozac dose. Ongoing gender dysphoria but overall improving.   Preventative: COLONOSCOPY Date: 2007 WNL [facility]).  Well woman - OBGYN Dr. [facility] last seen >1 yr ago. S/p total hysterectomy with oophorectomy for endometriosis with dysmenorrhea. Wants to have further well woman here.  Mammogram - has not had. No fmhx breast cancer. Pt declines. Agrees to breast exam today.  Flu shot - yearly Tdap 08/2005. Td 2016 Seat belt use discussed. Sunscreen use discussed, no changing moles. Non smoker Alcohol - a few drinks a week  Lives with husband 2017) and son [name]) Occupation: [name]: Associate's degree Activity: exercise 3x/wk (stationary bike) Diet: good water, fruits/vegetables some  Relevant past medical, surgical, family and social history reviewed and updated as indicated. Interim medical history since our last visit reviewed. Allergies and medications reviewed and updated. Current Outpatient Prescriptions on File Prior to Visit  Medication Sig  . Multiple Vitamin (MULTIVITAMIN) tablet Take 1 tablet by mouth daily.   No current facility-administered medications on file prior to visit.     Review of Systems  Constitutional: Negative for activity change, appetite change, chills,  fatigue, fever and unexpected weight change.  HENT: Negative for hearing loss.   Eyes: Negative for visual disturbance.  Respiratory: Negative for cough, chest tightness, shortness of breath and wheezing.   Cardiovascular: Negative for chest pain, palpitations and leg swelling.  Gastrointestinal: Negative for abdominal distention, abdominal pain, blood in stool, constipation, diarrhea, nausea and vomiting.  Genitourinary: Negative for difficulty urinating and hematuria.  Musculoskeletal: Negative for arthralgias, myalgias and neck pain.  Skin: Negative for rash.  Neurological: Negative for dizziness, seizures, syncope and headaches.  Hematological: Negative for adenopathy. Does not bruise/bleed easily.  Psychiatric/Behavioral: Negative for dysphoric mood. The patient is not nervous/anxious.    Per HPI unless specifically indicated in ROS section     Objective:    BP 118/80   Pulse 64   Temp 99 F (37.2 C) (Oral)   Ht 5' 8.5" (1.74 m)   Wt 210 lb 4 oz (95.4 kg)   LMP 07/17/2012   BMI 31.50 kg/m   Wt Readings from Last 3 Encounters:  09/19/16 210 lb 4 oz (95.4 kg)  02/15/16 209 lb 12 oz (95.1 kg)  08/24/15 198 lb 4 oz (89.9 kg)    Physical Exam  Constitutional: She is oriented to person, place, and time. She appears well-developed and well-nourished. No distress.  HENT:  Head: Normocephalic and atraumatic.  Right Ear: Hearing, tympanic membrane, external ear and ear canal normal.  Left Ear: Hearing, tympanic membrane, external ear and ear canal normal.  Nose: Nose normal.  Mouth/Throat: Uvula is midline, oropharynx is clear and moist and mucous membranes  are normal. No oropharyngeal exudate, posterior oropharyngeal edema or posterior oropharyngeal erythema.  Eyes: Conjunctivae and EOM are normal. Pupils are equal, round, and reactive to light. No scleral icterus.  Neck: Normal range of motion. Neck supple. No thyromegaly present.  Cardiovascular: Normal rate, regular rhythm,  normal heart sounds and intact distal pulses.   No murmur heard. Pulses:      Radial pulses are 2+ on the right side, and 2+ on the left side.  Pulmonary/Chest: Effort normal and breath sounds normal. No respiratory distress. She has no wheezes. She has no rales. Right breast exhibits no inverted nipple, no mass, no nipple discharge, no skin change and no tenderness. Left breast exhibits no inverted nipple, no mass, no nipple discharge, no skin change and no tenderness. Breasts are symmetrical.  Abdominal: Soft. Bowel sounds are normal. She exhibits no distension and no mass. There is hepatomegaly (with mild discomfort). There is no tenderness. There is no rebound and no guarding.  Musculoskeletal: Normal range of motion. She exhibits no edema.  Acquired trigger finger of R 5th pinky   Lymphadenopathy:       Head (right side): No submental, no submandibular, no tonsillar, no preauricular and no posterior auricular adenopathy present.       Head (left side): No submental, no submandibular, no tonsillar, no preauricular and no posterior auricular adenopathy present.    She has no cervical adenopathy.    She has no axillary adenopathy.       Right axillary: No lateral adenopathy present.       Left axillary: No lateral adenopathy present.      Right: No supraclavicular adenopathy present.       Left: No supraclavicular adenopathy present.  Neurological: She is alert and oriented to person, place, and time.  CN grossly intact, station and gait intact  Skin: Skin is warm and dry. No rash noted.  Psychiatric: She has a normal mood and affect. Her behavior is normal. Judgment and thought content normal.  Nursing note and vitals reviewed.  Results for orders placed or performed in visit on 09/12/16  Lipid panel  Result Value Ref Range   Cholesterol 249 (H) 0 - 200 mg/dL   Triglycerides 14.4 0.0 - 149.0 mg/dL   HDL 81.85 >63.14 mg/dL   VLDL 97.0 0.0 - 26.3 mg/dL   LDL Cholesterol 785 (H) 0 - 99  mg/dL   Total CHOL/HDL Ratio 4    NonHDL 188.56   TSH  Result Value Ref Range   TSH 2.23 0.35 - 4.50 uIU/mL  Basic metabolic panel  Result Value Ref Range   Sodium 139 135 - 145 mEq/L   Potassium 4.2 3.5 - 5.1 mEq/L   Chloride 101 96 - 112 mEq/L   CO2 32 19 - 32 mEq/L   Glucose, Bld 84 70 - 99 mg/dL   BUN 12 6 - 23 mg/dL   Creatinine, Ser 8.85 0.40 - 1.20 mg/dL   Calcium 9.5 8.4 - 02.7 mg/dL   GFR 741.28 >78.67 mL/min      Assessment & Plan:   Problem List Items Addressed This Visit    Acquired trigger finger    Stable. After work accident with cut tendon s/p surgery.       Encounter for well adult exam with abnormal findings - Primary    Preventative protocols reviewed and updated unless pt declined. Discussed healthy diet and lifestyle.       Gender dysphoria in adult    Off HRT. Stabler  period. Involvement in church has helped.       Hepatomegaly    New - ?splenomegaly. Check abd [name].       Relevant Orders   US Abdomen Complete   HLD (hyperlipidemia)    Chronic, elevated. Discussed healthy diet changes to help lower LDL.  ASCVD 10 yr risk = 1%.       MDD (major depressive disorder)    Stable period, desires to try and taper off prozac. Will decrease to 20mg  capsules.       Relevant Medications   FLUoxetine (PROZAC) 20 MG capsule    Other Visit Diagnoses    Need for influenza vaccination       Relevant Orders   Flu Vaccine QUAD 36+ mos PF IM (Fluarix & Fluzone Quad PF) (Completed)       Follow up plan: Return in about 1 year (around 09/19/2017) for annual exam, prior fasting for blood work.  11/19/2017, MD

## 2016-09-19 NOTE — Assessment & Plan Note (Signed)
Off HRT. Stabler period. Involvement in church has helped.

## 2016-09-19 NOTE — Assessment & Plan Note (Signed)
Chronic, elevated. Discussed healthy diet changes to help lower LDL.  ASCVD 10 yr risk = 1%.

## 2016-09-19 NOTE — Assessment & Plan Note (Signed)
New - ?splenomegaly. Check abd [name].

## 2016-09-19 NOTE — Assessment & Plan Note (Signed)
Preventative protocols reviewed and updated unless pt declined. Discussed healthy diet and lifestyle.  

## 2016-09-19 NOTE — Patient Instructions (Addendum)
Flu shot today  We will order abdominal ultrasound to check liver.  You are doing well today.  Return as needed or in 1 year for next physical.  Health Maintenance, Female Adopting a healthy lifestyle and getting preventive care can go a long way to promote health and wellness. Talk with your health care provider about what schedule of regular examinations is right for you. This is a good chance for you to check in with your provider about disease prevention and staying healthy. In between checkups, there are plenty of things you can do on your own. Experts have done a lot of research about which lifestyle changes and preventive measures are most likely to keep you healthy. Ask your health care provider for more information. WEIGHT AND DIET  Eat a healthy diet  Be sure to include plenty of vegetables, fruits, low-fat dairy products, and lean protein.  Do not eat a lot of foods high in solid fats, added sugars, or salt.  Get regular exercise. This is one of the most important things you can do for your health.  Most adults should exercise for at least 150 minutes each week. The exercise should increase your heart rate and make you sweat (moderate-intensity exercise).  Most adults should also do strengthening exercises at least twice a week. This is in addition to the moderate-intensity exercise.  Maintain a healthy weight  Body mass index (BMI) is a measurement that can be used to identify possible weight problems. It estimates body fat based on height and weight. Your health care provider can help determine your BMI and help you achieve or maintain a healthy weight.  For females 78 years of age and older:   A BMI below 18.5 is considered underweight.  A BMI of 18.5 to 24.9 is normal.  A BMI of 25 to 29.9 is considered overweight.  A BMI of 30 and above is considered obese.  Watch levels of cholesterol and blood lipids  You should start having your blood tested for lipids and  cholesterol at 47 years of age, then have this test every 5 years.  You may need to have your cholesterol levels checked more often if:  Your lipid or cholesterol levels are high.  You are older than 47 years of age.  You are at high risk for heart disease.  CANCER SCREENING   Lung Cancer  Lung cancer screening is recommended for adults 96-58 years old who are at high risk for lung cancer because of a history of smoking.  A yearly low-dose CT scan of the lungs is recommended for people who:  Currently smoke.  Have quit within the past 15 years.  Have at least a 30-pack-year history of smoking. A pack year is smoking an average of one pack of cigarettes a day for 1 year.  Yearly screening should continue until it has been 15 years since you quit.  Yearly screening should stop if you develop a health problem that would prevent you from having lung cancer treatment.  Breast Cancer  Practice breast self-awareness. This means understanding how your breasts normally appear and feel.  It also means doing regular breast self-exams. Let your health care provider know about any changes, no matter how small.  If you are in your 20s or 30s, you should have a clinical breast exam (CBE) by a health care provider every 1-3 years as part of a regular health exam.  If you are 63 or older, have a CBE every year. Also  consider having a breast X-ray (mammogram) every year.  If you have a family history of breast cancer, talk to your health care provider about genetic screening.  If you are at high risk for breast cancer, talk to your health care provider about having an MRI and a mammogram every year.  Breast cancer gene (BRCA) assessment is recommended for women who have family members with BRCA-related cancers. BRCA-related cancers include:  Breast.  Ovarian.  Tubal.  Peritoneal cancers.  Results of the assessment will determine the need for genetic counseling and BRCA1 and BRCA2  testing. Cervical Cancer Your health care provider may recommend that you be screened regularly for cancer of the pelvic organs (ovaries, uterus, and vagina). This screening involves a pelvic examination, including checking for microscopic changes to the surface of your cervix (Pap test). You may be encouraged to have this screening done every 3 years, beginning at age 31.  For women ages 67-65, health care providers may recommend pelvic exams and Pap testing every 3 years, or they may recommend the Pap and pelvic exam, combined with testing for human papilloma virus (HPV), every 5 years. Some types of HPV increase your risk of cervical cancer. Testing for HPV may also be done on women of any age with unclear Pap test results.  Other health care providers may not recommend any screening for nonpregnant women who are considered low risk for pelvic cancer and who do not have symptoms. Ask your health care provider if a screening pelvic exam is right for you.  If you have had past treatment for cervical cancer or a condition that could lead to cancer, you need Pap tests and screening for cancer for at least 20 years after your treatment. If Pap tests have been discontinued, your risk factors (such as having a new sexual partner) need to be reassessed to determine if screening should resume. Some women have medical problems that increase the chance of getting cervical cancer. In these cases, your health care provider may recommend more frequent screening and Pap tests. Colorectal Cancer  This type of cancer can be detected and often prevented.  Routine colorectal cancer screening usually begins at 47 years of age and continues through 47 years of age.  Your health care provider may recommend screening at an earlier age if you have risk factors for colon cancer.  Your health care provider may also recommend using home test kits to check for hidden blood in the stool.  A small camera at the end of a  tube can be used to examine your colon directly (sigmoidoscopy or colonoscopy). This is done to check for the earliest forms of colorectal cancer.  Routine screening usually begins at age 25.  Direct examination of the colon should be repeated every 5-10 years through 47 years of age. However, you may need to be screened more often if early forms of precancerous polyps or small growths are found. Skin Cancer  Check your skin from head to toe regularly.  Tell your health care provider about any new moles or changes in moles, especially if there is a change in a mole's shape or color.  Also tell your health care provider if you have a mole that is larger than the size of a pencil eraser.  Always use sunscreen. Apply sunscreen liberally and repeatedly throughout the day.  Protect yourself by wearing long sleeves, pants, a wide-brimmed hat, and sunglasses whenever you are outside. HEART DISEASE, DIABETES, AND HIGH BLOOD PRESSURE   High  blood pressure causes heart disease and increases the risk of stroke. High blood pressure is more likely to develop in:  People who have blood pressure in the high end of the normal range (130-139/85-89 mm Hg).  People who are overweight or obese.  People who are African American.  If you are 30-90 years of age, have your blood pressure checked every 3-5 years. If you are 83 years of age or older, have your blood pressure checked every year. You should have your blood pressure measured twice--once when you are at a hospital or clinic, and once when you are not at a hospital or clinic. Record the average of the two measurements. To check your blood pressure when you are not at a hospital or clinic, you can use:  An automated blood pressure machine at a pharmacy.  A home blood pressure monitor.  If you are between 66 years and 43 years old, ask your health care provider if you should take aspirin to prevent strokes.  Have regular diabetes screenings. This  involves taking a blood sample to check your fasting blood sugar level.  If you are at a normal weight and have a low risk for diabetes, have this test once every three years after 47 years of age.  If you are overweight and have a high risk for diabetes, consider being tested at a younger age or more often. PREVENTING INFECTION  Hepatitis B  If you have a higher risk for hepatitis B, you should be screened for this virus. You are considered at high risk for hepatitis B if:  You were born in a country where hepatitis B is common. Ask your health care provider which countries are considered high risk.  Your parents were born in a high-risk country, and you have not been immunized against hepatitis B (hepatitis B vaccine).  You have HIV or AIDS.  You use needles to inject street drugs.  You live with someone who has hepatitis B.  You have had sex with someone who has hepatitis B.  You get hemodialysis treatment.  You take certain medicines for conditions, including cancer, organ transplantation, and autoimmune conditions. Hepatitis C  Blood testing is recommended for:  Everyone born from [birth year] through 1967.  Anyone with known risk factors for hepatitis C. Sexually transmitted infections (STIs)  You should be screened for sexually transmitted infections (STIs) including gonorrhea and chlamydia if:  You are sexually active and are younger than 47 years of age.  You are older than 47 years of age and your health care provider tells you that you are at risk for this type of infection.  Your sexual activity has changed since you were last screened and you are at an increased risk for chlamydia or gonorrhea. Ask your health care provider if you are at risk.  If you do not have HIV, but are at risk, it may be recommended that you take a prescription medicine daily to prevent HIV infection. This is called pre-exposure prophylaxis (PrEP). You are considered at risk if:  You are  sexually active and do not regularly use condoms or know the HIV status of your partner(s).  You take drugs by injection.  You are sexually active with a partner who has HIV. Talk with your health care provider about whether you are at high risk of being infected with HIV. If you choose to begin PrEP, you should first be tested for HIV. You should then be tested every 3 months for as long  as you are taking PrEP.  PREGNANCY   If you are premenopausal and you may become pregnant, ask your health care provider about preconception counseling.  If you may become pregnant, take 400 to 800 micrograms (mcg) of folic acid every day.  If you want to prevent pregnancy, talk to your health care provider about birth control (contraception). OSTEOPOROSIS AND MENOPAUSE   Osteoporosis is a disease in which the bones lose minerals and strength with aging. This can result in serious bone fractures. Your risk for osteoporosis can be identified using a bone density scan.  If you are 65 years of age or older, or if you are at risk for osteoporosis and fractures, ask your health care provider if you should be screened.  Ask your health care provider whether you should take a calcium or vitamin D supplement to lower your risk for osteoporosis.  Menopause may have certain physical symptoms and risks.  Hormone replacement therapy may reduce some of these symptoms and risks. Talk to your health care provider about whether hormone replacement therapy is right for you.  HOME CARE INSTRUCTIONS   Schedule regular health, dental, and eye exams.  Stay current with your immunizations.   Do not use any tobacco products including cigarettes, chewing tobacco, or electronic cigarettes.  If you are pregnant, do not drink alcohol.  If you are breastfeeding, limit how much and how often you drink alcohol.  Limit alcohol intake to no more than 1 drink per day for nonpregnant women. One drink equals 12 ounces of beer, 5  ounces of wine, or 1 ounces of hard liquor.  Do not use street drugs.  Do not share needles.  Ask your health care provider for help if you need support or information about quitting drugs.  Tell your health care provider if you often feel depressed.  Tell your health care provider if you have ever been abused or do not feel safe at home.   This information is not intended to replace advice given to you by your health care provider. Make sure you discuss any questions you have with your health care provider.   Document Released: 06/18/2011 Document Revised: 12/24/2014 Document Reviewed: 11/04/2013 Elsevier Interactive Patient Education 2016 [employer]  

## 2016-09-19 NOTE — Progress Notes (Signed)
Pre visit review using our clinic review tool, if applicable. No additional management support is needed unless otherwise documented below in the visit note. 

## 2016-10-02 ENCOUNTER — Ambulatory Visit
Admission: RE | Admit: 2016-10-02 | Discharge: 2016-10-02 | Disposition: A | Discharge status: Home / Self Care | Payer: 59 | Source: Ambulatory Visit | Attending: Family Medicine | Admitting: Family Medicine

## 2016-10-02 DIAGNOSIS — R160 Hepatomegaly, not elsewhere classified: Secondary | ICD-10-CM

## 2016-10-18 ENCOUNTER — Encounter: Payer: Self-pay | Admitting: Family Medicine

## 2016-10-22 MED ORDER — FLUOXETINE HCL 40 MG PO CAPS: 40.0000 mg | ORAL_CAPSULE | Freq: Every day | ORAL | 3 refills | Status: DC

## 2016-12-28 ENCOUNTER — Encounter: Payer: Self-pay | Admitting: Family Medicine

## 2016-12-28 DIAGNOSIS — F411 Generalized anxiety disorder: Secondary | ICD-10-CM

## 2016-12-28 MED ORDER — ALPRAZOLAM 0.25 MG PO TABS: 0.2500 mg | ORAL_TABLET | Freq: Two times a day (BID) | ORAL | 0 refills | Status: DC | PRN

## 2016-12-28 NOTE — Assessment & Plan Note (Signed)
Rare xanax PRN stress

## 2017-02-26 ENCOUNTER — Ambulatory Visit (INDEPENDENT_AMBULATORY_CARE_PROVIDER_SITE_OTHER): Payer: BLUE CROSS/BLUE SHIELD | Admitting: Internal Medicine

## 2017-02-26 ENCOUNTER — Encounter: Payer: Self-pay | Admitting: Internal Medicine

## 2017-02-26 ENCOUNTER — Other Ambulatory Visit: Payer: BLUE CROSS/BLUE SHIELD

## 2017-02-26 VITALS — BP 130/72 | HR 60 | Temp 97.9°F | Ht 68.5 in | Wt 213.0 lb

## 2017-02-26 DIAGNOSIS — M545 Low back pain, unspecified: Secondary | ICD-10-CM

## 2017-02-26 LAB — POC URINALSYSI DIPSTICK (AUTOMATED)
Bilirubin, UA: NEGATIVE
Blood, UA: NEGATIVE
Glucose, UA: NEGATIVE
KETONES UA: NEGATIVE
Leukocytes, UA: NEGATIVE
Nitrite, UA: NEGATIVE
Protein, UA: NEGATIVE
SPEC GRAV UA: 1.015
Urobilinogen, UA: NEGATIVE — AB
pH, UA: 6.5

## 2017-02-26 MED ORDER — CYCLOBENZAPRINE HCL 5 MG PO TABS: 5.0000 mg | ORAL_TABLET | Freq: Three times a day (TID) | ORAL | 1 refills | Status: DC | PRN

## 2017-02-26 NOTE — Progress Notes (Signed)
Pre visit review using our clinic review tool, if applicable. No additional management support is needed unless otherwise documented below in the visit note. 

## 2017-02-26 NOTE — Progress Notes (Signed)
   Subjective:    Patient ID: Katherine Steele, female    DOB: [birth year], 46 y.o.   MRN: [id number]  HPI The patient is a 46 YO female coming in for low back pain. She is concerned about UTI. She has had them in the past and doesn't want to get one again. She denies lifting or moving any heavy objects or other strain to her back. She has tried tylenol and ibuprofen and ibuprofen is somewhat effective for the pain. She denies numbness or pain in legs or arms. No bowel or bladder incontinence. She denies burning with urination or suprapubic pain. No fevers or chills. Pain 2-3/10.   Review of Systems  Constitutional: Negative.   Respiratory: Negative.   Cardiovascular: Negative.   Gastrointestinal: Negative.   Genitourinary: Positive for frequency. Negative for difficulty urinating, dysuria, enuresis, hematuria, pelvic pain and urgency.  Musculoskeletal: Positive for arthralgias and back pain. Negative for gait problem, joint swelling, myalgias, neck pain and neck stiffness.  Neurological: Negative.       Objective:   Physical Exam  Constitutional: She is oriented to person, place, and time. She appears well-developed and well-nourished.  HENT:  Head: Normocephalic and atraumatic.  Eyes: EOM are normal.  Cardiovascular: Normal rate and regular rhythm.   Pulmonary/Chest: Effort normal.  Abdominal: Soft. Bowel sounds are normal. She exhibits no distension and no mass. There is no tenderness. There is no rebound and no guarding.  Musculoskeletal: She exhibits tenderness.  Some tenderness left lower back paraspinal, no spinal tenderness, no radiation  Neurological: She is alert and oriented to person, place, and time.   Vitals:   02/26/17 1625  BP: 130/72  Pulse: 60  Temp: 97.9 F (36.6 C)  TempSrc: Oral  SpO2: 99%  Weight: 213 lb (96.6 kg)  Height: 5' 8.5" (1.74 m)      Assessment & Plan:

## 2017-02-26 NOTE — Patient Instructions (Signed)
We have sent in the muscle relaxer for the pain if needed called flexeril (also called cyclobenzaprine).  We are sending the urine for a culture today and will have the results by Friday.   If you are not feeling better by next week call us back or send a mychart message and we will do an x-ray of the back.   It is okay to use the heating pad or aleve or advil for pain.

## 2017-02-28 LAB — URINE CULTURE

## 2017-03-01 ENCOUNTER — Ambulatory Visit: Payer: Self-pay | Admitting: Family Medicine

## 2017-03-01 DIAGNOSIS — M545 Low back pain, unspecified: Secondary | ICD-10-CM | POA: Insufficient documentation

## 2017-03-01 NOTE — Assessment & Plan Note (Addendum)
Checked U/A in the office without signs of infection. Sending culture to be sure. Rx for flexeril and likely strain of back, no imaging indicated at this time. No red flag signs. No blood in U/A to indicate kidney stone and pain is not very severe.

## 2017-04-12 IMAGING — US US ABDOMEN COMPLETE
1 series · 14 of 25 positions shown · non-contrast
Comparison: [HOSPITAL] CT Abdomen and Pelvis
10/30/2013 and earlier

CLINICAL DATA: 45-year-old female with possible hepatosplenomegaly
on physical exam. Right upper quadrant abdominal pain for 2 weeks.
Initial encounter.

EXAM:
ABDOMEN ULTRASOUND COMPLETE

[Series 1: us abdomen complete · 0.26mm/px · 14 of 70 slices shown]
[im 1/70]
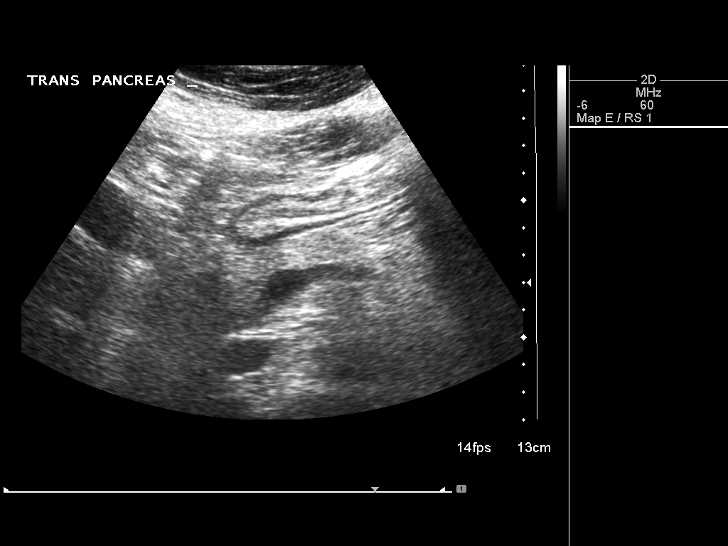
[im 6/70]
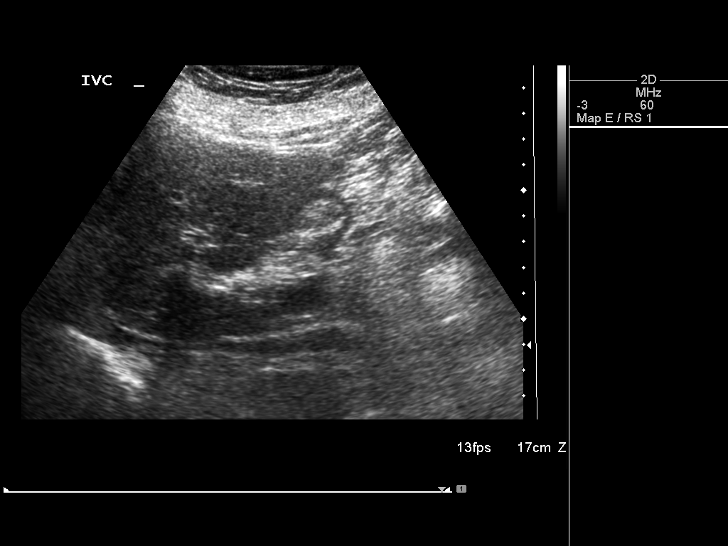
[im 12/70]
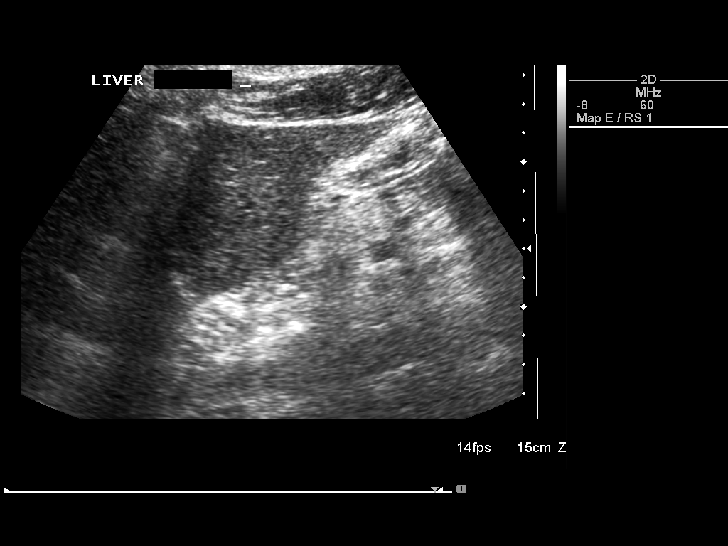
[im 18/70]
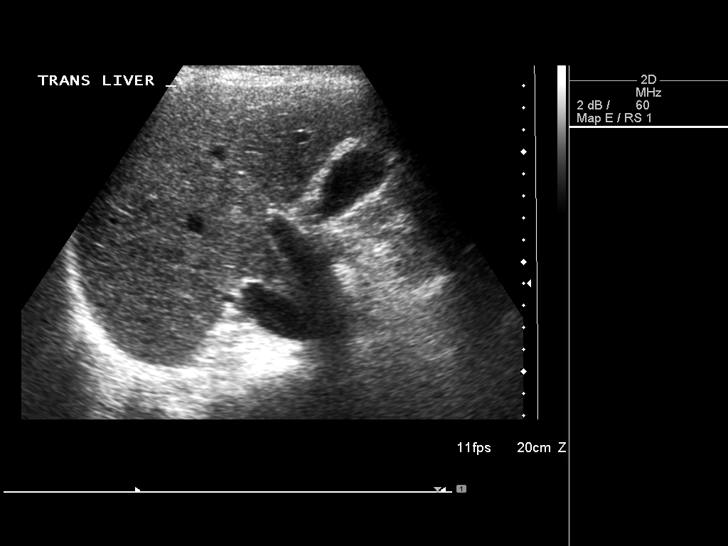
[im 24/70]
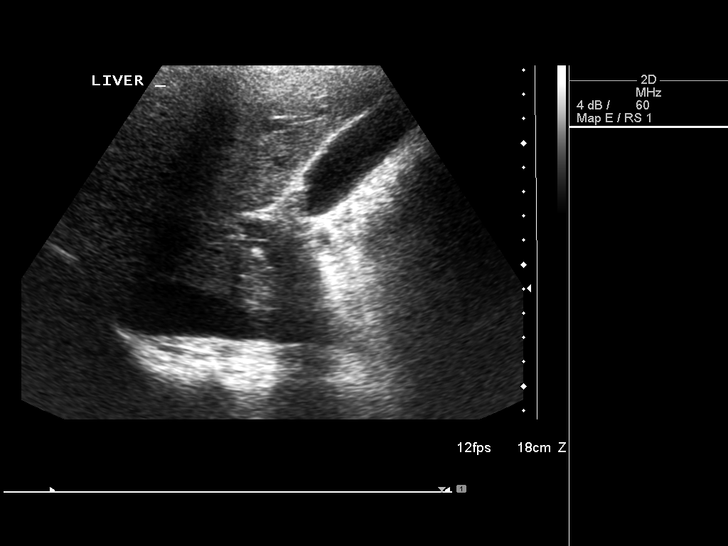
[im 26/70]
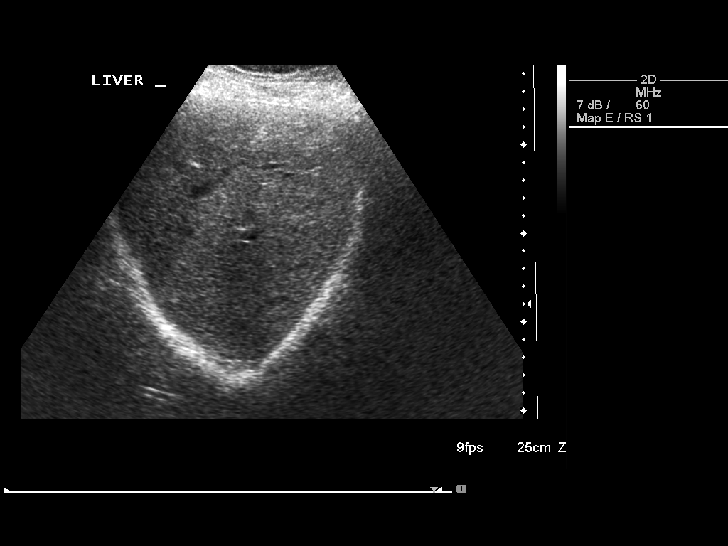
[im 32/70]
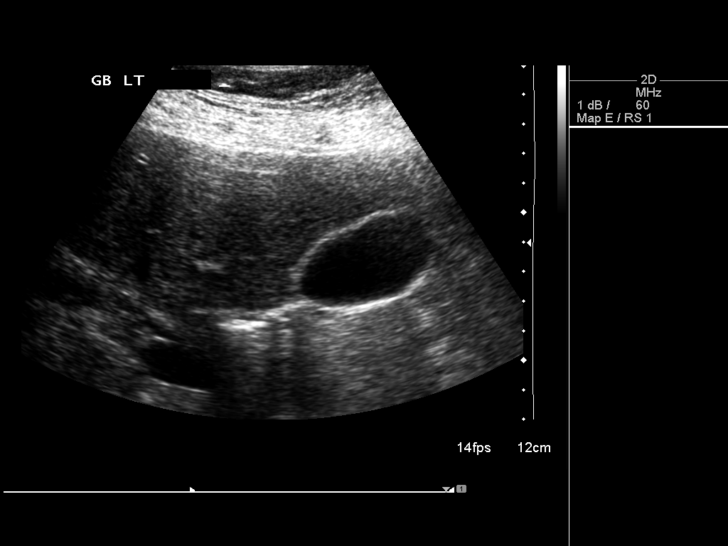
[im 38/70]
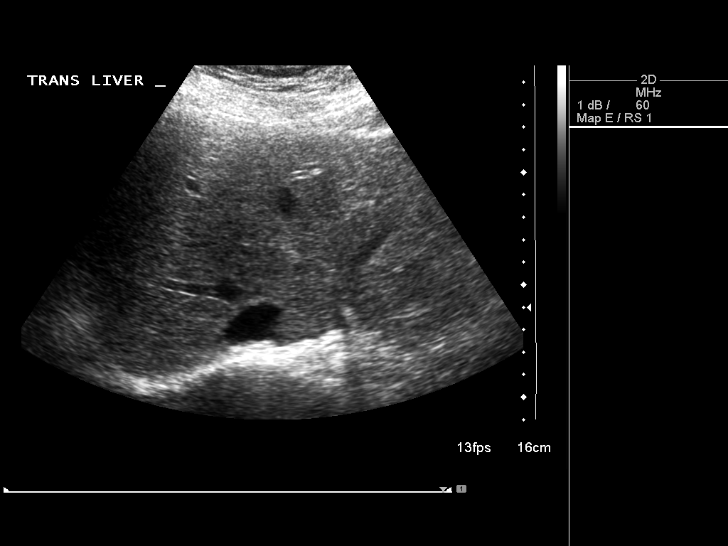
[im 44/70]
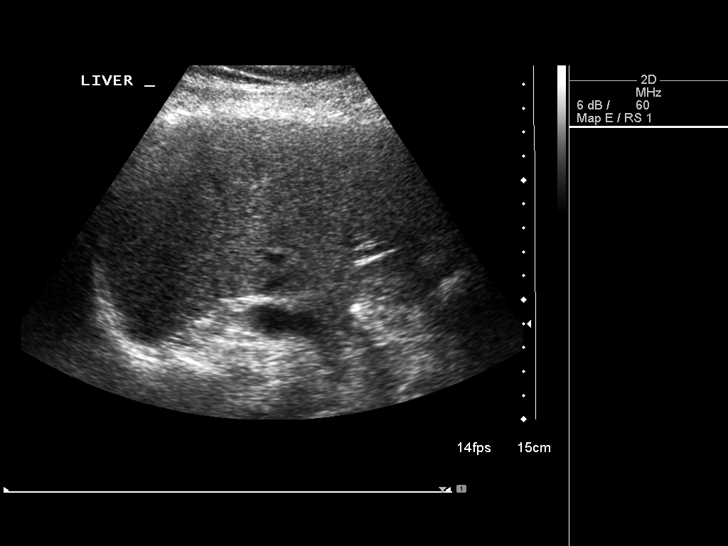
[im 47/70]
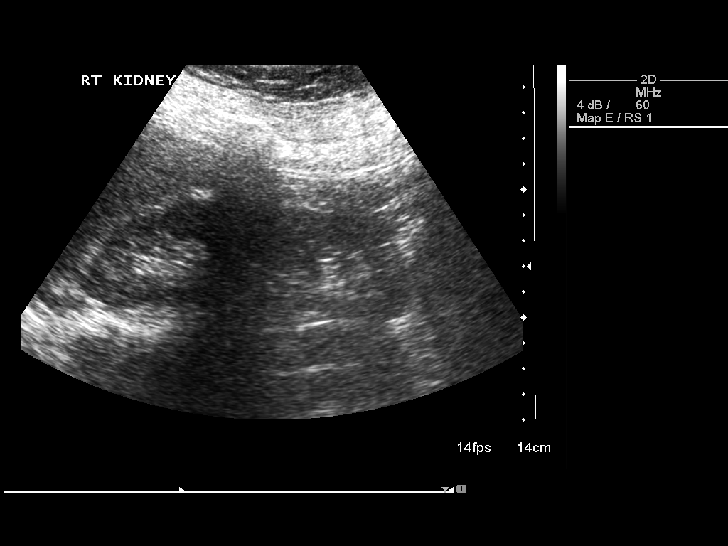
[im 52/70]
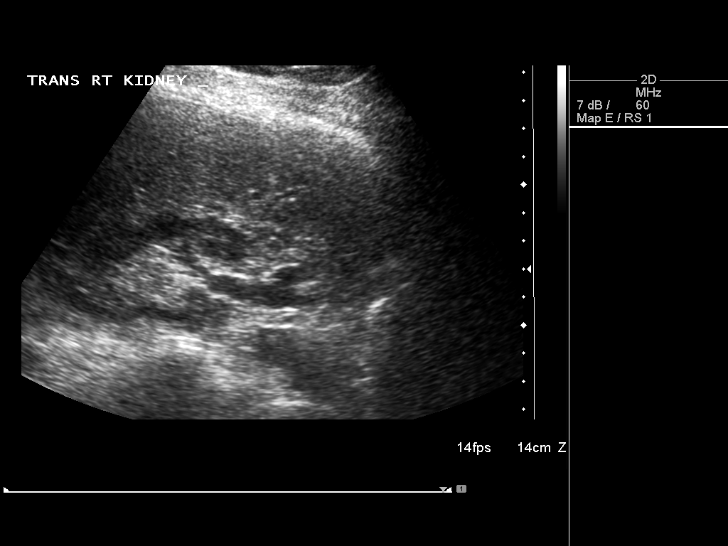
[im 58/70]
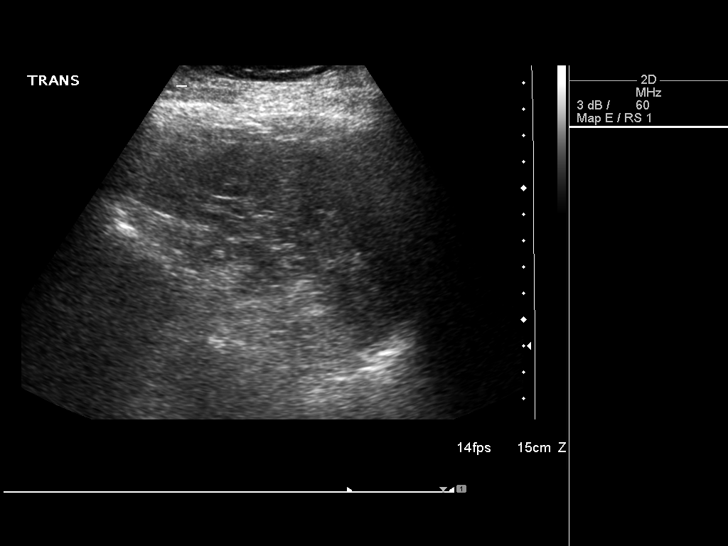
[im 64/70]
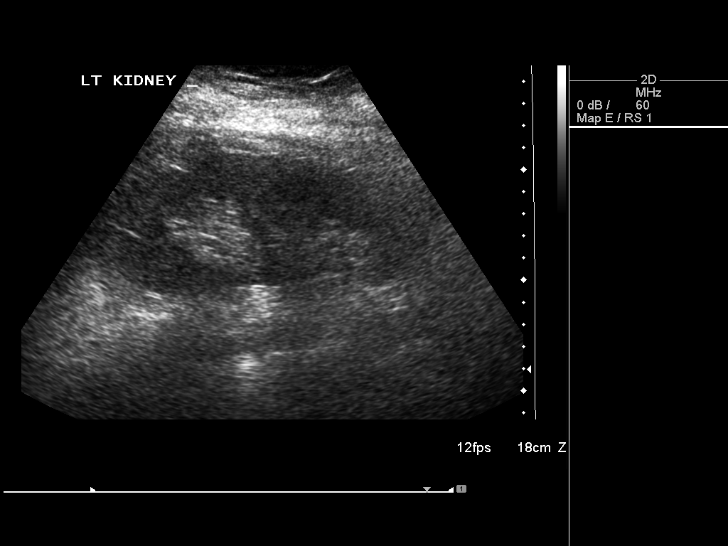
[im 70/70]
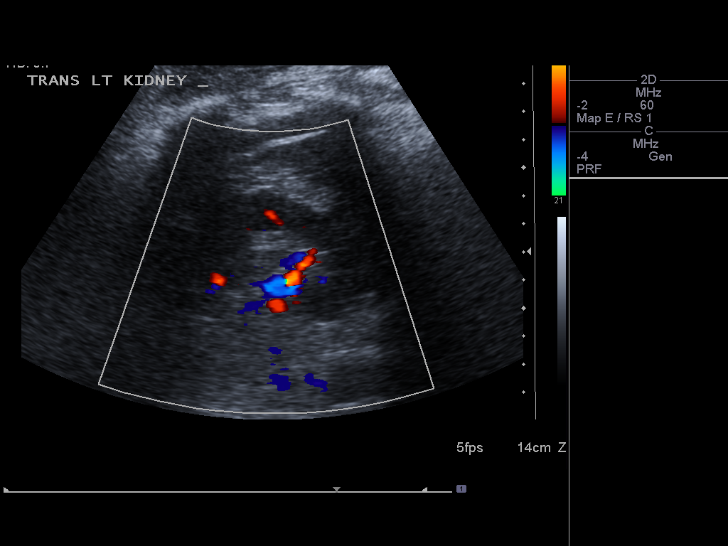

[14 of 25 positions shown; findings below may reference images not displayed]

FINDINGS: Gallbladder: No gallstones or wall thickening visualized. No
sonographic Murphy sign noted by sonographer.

Common bile duct: Diameter: 4 mm, normal

Liver: No focal lesion identified. Within normal limits in
parenchymal echogenicity. Overall liver size appears stable since
5914 and within normal limits.

IVC: No abnormality visualized.

Pancreas: Visualized portion unremarkable.

Spleen: Spleen size appears stable and normal. Splenic parenchyma
appears normal.

Right Kidney: Length: 12.8 cm. Echogenicity within normal limits. No
mass or hydronephrosis visualized.

Left Kidney: Length: 13.2 cm. Echogenicity within normal limits. No
mass or hydronephrosis visualized.

Abdominal aorta: No aneurysm visualized.

Other findings: None.
IMPRESSION: 1. Stable and normal appearance of the liver and spleen since 5914.
No hepatosplenomegaly.
2. Negative abdomen ultrasound.

## 2017-09-20 ENCOUNTER — Other Ambulatory Visit: Payer: 59

## 2017-09-25 ENCOUNTER — Encounter: Payer: 59 | Admitting: Family Medicine

## 2017-09-27 ENCOUNTER — Other Ambulatory Visit: Payer: Self-pay | Admitting: Family Medicine

## 2017-09-27 ENCOUNTER — Other Ambulatory Visit (INDEPENDENT_AMBULATORY_CARE_PROVIDER_SITE_OTHER): Payer: BLUE CROSS/BLUE SHIELD

## 2017-09-27 DIAGNOSIS — E7800 Pure hypercholesterolemia, unspecified: Secondary | ICD-10-CM

## 2017-09-27 LAB — LIPID PANEL
CHOL/HDL RATIO: 5
Cholesterol: 267 mg/dL — ABNORMAL HIGH (ref 0–200)
HDL: 55.2 mg/dL (ref >39.00)
LDL CALC: 186 mg/dL — AB (ref 0–99)
NONHDL: 212.23
Triglycerides: 133 mg/dL (ref 0.0–149.0)
VLDL: 26.6 mg/dL (ref 0.0–40.0)

## 2017-09-27 LAB — TSH: TSH: 2.32 u[IU]/mL (ref 0.35–4.50)

## 2017-09-27 LAB — COMPREHENSIVE METABOLIC PANEL
ALT: 15 U/L (ref 0–35)
AST: 15 U/L (ref 0–37)
Albumin: 4.2 g/dL (ref 3.5–5.2)
Alkaline Phosphatase: 85 U/L (ref 39–117)
BILIRUBIN TOTAL: 0.8 mg/dL (ref 0.2–1.2)
BUN: 12 mg/dL (ref 6–23)
CALCIUM: 9 mg/dL (ref 8.4–10.5)
CHLORIDE: 105 meq/L (ref 96–112)
CO2: 28 meq/L (ref 19–32)
Creatinine, Ser: 0.59 mg/dL (ref 0.40–1.20)
GFR: 115.58 mL/min (ref >60.00)
Glucose, Bld: 87 mg/dL (ref 70–99)
Potassium: 4.1 mEq/L (ref 3.5–5.1)
Sodium: 141 mEq/L (ref 135–145)
Total Protein: 7 g/dL (ref 6.0–8.3)

## 2017-10-03 ENCOUNTER — Encounter: Payer: BLUE CROSS/BLUE SHIELD | Admitting: Family Medicine

## 2017-10-09 ENCOUNTER — Ambulatory Visit (INDEPENDENT_AMBULATORY_CARE_PROVIDER_SITE_OTHER): Payer: BLUE CROSS/BLUE SHIELD | Admitting: Family Medicine

## 2017-10-09 ENCOUNTER — Encounter: Payer: Self-pay | Admitting: Family Medicine

## 2017-10-09 VITALS — BP 122/78 | HR 59 | Temp 97.7°F | Ht 67.0 in | Wt 220.5 lb

## 2017-10-09 DIAGNOSIS — E7800 Pure hypercholesterolemia, unspecified: Secondary | ICD-10-CM

## 2017-10-09 DIAGNOSIS — F3341 Major depressive disorder, recurrent, in partial remission: Secondary | ICD-10-CM

## 2017-10-09 DIAGNOSIS — Z0000 Encounter for general adult medical examination without abnormal findings: Secondary | ICD-10-CM | POA: Diagnosis not present

## 2017-10-09 DIAGNOSIS — Z23 Encounter for immunization: Secondary | ICD-10-CM | POA: Diagnosis not present

## 2017-10-09 NOTE — Progress Notes (Signed)
BP 122/78 (BP Location: Left Arm, Patient Position: Sitting, Cuff Size: Normal)   Pulse (!) 59   Temp 97.7 F (36.5 C) (Oral)   Ht 5\' 7"  (1.702 m)   Wt 220 lb 8 oz (100 kg)   LMP 07/17/2012   SpO2 95%   BMI 34.54 kg/m    CC: CPE Subjective:    Patient ID: Katherine Steele, female    DOB: 1971/07/25, 46 y.o.   MRN: [id number]  HPI: Katherine Steele is a 46 y.o. female presenting on 10/09/2017 for Annual Exam   H/o depression, anxiety and gender dysphoria. previously saw counselor at [facility]).also saw psychiatrist Dr. Lucianne Muss at [facility] - but she left. Saw Dr Arnold Long - not happy with new doc. Requested we start prescribing prozac, currently on 40mg  daily. More involved in church which has been extremely positive. Wonders about decreasing prozac dose. Ongoing gender dysphoria but overall improving.   Some stress with son's depression, high functioning autism, 48 yo. Intermittent xanax - now off this.   Preventative: COLONOSCOPY Date: 2007 WNL [facility]) - done for abdominal pain after oophorectomy. Start screening at age 44.  Well woman - OBGYN Dr. Alvie Heidelberg last seen >1 yr ago. S/p total hysterectomy with bilateral oophorectomy for endometriosis with dysmenorrhea.  Mammogram - has not had. No fmhx breast cancer. Decides to postpone mammogram to age 53 Flu shot yearly Tdap 08/2005. Td 2016 Seat belt use discussed. Sunscreen use discussed, no changing moles. Non smoker, husband smokes outdoors Alcohol - <1 beer/day  Lives with husband [name]) and son Katherine Steele) Occupation: Lexicographer Edu: Associate's degree Activity: exercise 3x/wk (stationary bike) Diet: good water, fruits/vegetables some  Relevant past medical, surgical, family and social history reviewed and updated as indicated. Interim medical history since our last visit reviewed. Allergies and medications reviewed and updated. Outpatient Medications Prior to Visit  Medication Sig Dispense Refill    . FLUoxetine (PROZAC) 40 MG capsule Take 1 capsule (40 mg total) by mouth daily. 90 capsule 3  . Multiple Vitamin (MULTIVITAMIN) tablet Take 1 tablet by mouth daily.    Marland Kitchen ALPRAZolam (XANAX) 0.25 MG tablet Take 1 tablet (0.25 mg total) by mouth 2 (two) times daily as needed for anxiety. 20 tablet 0  . cyclobenzaprine (FLEXERIL) 5 MG tablet Take 1 tablet (5 mg total) by mouth 3 (three) times daily as needed for muscle spasms. 30 tablet 1   No facility-administered medications prior to visit.      Per HPI unless specifically indicated in ROS section below Review of Systems  Constitutional: Negative for activity change, appetite change, chills, fatigue, fever and unexpected weight change.  HENT: Negative for hearing loss.   Eyes: Negative for visual disturbance.  Respiratory: Negative for cough, chest tightness, shortness of breath and wheezing.   Cardiovascular: Negative for chest pain, palpitations and leg swelling.  Gastrointestinal: Negative for abdominal distention, abdominal pain, blood in stool, constipation, diarrhea, nausea and vomiting.  Genitourinary: Negative for difficulty urinating and hematuria.  Musculoskeletal: Negative for arthralgias, myalgias and neck pain.  Skin: Negative for rash.  Neurological: Negative for dizziness, seizures, syncope and headaches.  Hematological: Negative for adenopathy. Does not bruise/bleed easily.  Psychiatric/Behavioral: Negative for dysphoric mood. The patient is not nervous/anxious.        Objective:    BP 122/78 (BP Location: Left Arm, Patient Position: Sitting, Cuff Size: Normal)   Pulse (!) 59   Temp 97.7 F (36.5 C) (Oral)   Ht 5\' 7"  (1.702  m)   Wt 220 lb 8 oz (100 kg)   LMP 07/17/2012   SpO2 95%   BMI 34.54 kg/m   Wt Readings from Last 3 Encounters:  10/09/17 220 lb 8 oz (100 kg)  02/26/17 213 lb (96.6 kg)  09/19/16 210 lb 4 oz (95.4 kg)    Physical Exam  Constitutional: She is oriented to person, place, and time. She  appears well-developed and well-nourished. No distress.  HENT:  Head: Normocephalic and atraumatic.  Right Ear: Hearing, tympanic membrane, external ear and ear canal normal.  Left Ear: Hearing, tympanic membrane, external ear and ear canal normal.  Nose: Nose normal.  Mouth/Throat: Uvula is midline, oropharynx is clear and moist and mucous membranes are normal. No oropharyngeal exudate, posterior oropharyngeal edema or posterior oropharyngeal erythema.  Eyes: Pupils are equal, round, and reactive to light. Conjunctivae and EOM are normal. No scleral icterus.  Neck: Normal range of motion. Neck supple. No thyromegaly present.  Cardiovascular: Normal rate, regular rhythm, normal heart sounds and intact distal pulses.   No murmur heard. Pulses:      Radial pulses are 2+ on the right side, and 2+ on the left side.  Pulmonary/Chest: Effort normal and breath sounds normal. No respiratory distress. She has no wheezes. She has no rales.  Abdominal: Soft. Bowel sounds are normal. She exhibits no distension and no mass. There is no tenderness. There is no rebound and no guarding.  Musculoskeletal: Normal range of motion. She exhibits no edema.  Lymphadenopathy:    She has no cervical adenopathy.  Neurological: She is alert and oriented to person, place, and time.  CN grossly intact, station and gait intact  Skin: Skin is warm and dry. No rash noted.  Psychiatric: She has a normal mood and affect. Her behavior is normal. Judgment and thought content normal.  Nursing note and vitals reviewed.  Results for orders placed or performed in visit on 09/27/17  Lipid panel  Result Value Ref Range   Cholesterol 267 (H) 0 - 200 mg/dL   Triglycerides 465.6 0.0 - 149.0 mg/dL   HDL 81.27 >51.70 mg/dL   VLDL 01.7 0.0 - 49.4 mg/dL   LDL Cholesterol 496 (H) 0 - 99 mg/dL   Total CHOL/HDL Ratio 5    NonHDL 212.23   Comprehensive metabolic panel  Result Value Ref Range   Sodium 141 135 - 145 mEq/L   Potassium  4.1 3.5 - 5.1 mEq/L   Chloride 105 96 - 112 mEq/L   CO2 28 19 - 32 mEq/L   Glucose, Bld 87 70 - 99 mg/dL   BUN 12 6 - 23 mg/dL   Creatinine, Ser 7.59 0.40 - 1.20 mg/dL   Total Bilirubin 0.8 0.2 - 1.2 mg/dL   Alkaline Phosphatase 85 39 - 117 U/L   AST 15 0 - 37 U/L   ALT 15 0 - 35 U/L   Total Protein 7.0 6.0 - 8.3 g/dL   Albumin 4.2 3.5 - 5.2 g/dL   Calcium 9.0 8.4 - 16.3 mg/dL   GFR 846.65 >99.35 mL/min  TSH  Result Value Ref Range   TSH 2.32 0.35 - 4.50 uIU/mL      Assessment & Plan:   Problem List Items Addressed This Visit    Health maintenance examination - Primary    Preventative protocols reviewed and updated unless pt declined. Discussed healthy diet and lifestyle.       HLD (hyperlipidemia)    Chronic, progressive elevation noted. Reviewed healthy diet changes  to improve chol levels. Low chol diet handout provided today.  The 10-year ASCVD risk score Denman George DC Montez Hageman., et al., 2013) is: 1.4%   Values used to calculate the score:     Age: 64 years     Sex: Female     Is Non-Hispanic African American: No     Diabetic: No     Tobacco smoker: No     Systolic Blood Pressure: 122 mmHg     Is BP treated: No     HDL Cholesterol: 55.2 mg/dL     Total Cholesterol: 267 mg/dL       MDD (major depressive disorder)    Chronic, stable back on prozac 40mg  daily. Will continue this regimen.        Other Visit Diagnoses    Need for influenza vaccination       Relevant Orders   Flu Vaccine QUAD 6+ mos PF IM (Fluarix Quad PF) (Completed)       Follow up plan: Return in about 1 year (around 10/09/2018) for annual exam, prior fasting for blood work.  10/11/2018, MD

## 2017-10-09 NOTE — Assessment & Plan Note (Signed)
Preventative protocols reviewed and updated unless pt declined. Discussed healthy diet and lifestyle.  

## 2017-10-09 NOTE — Assessment & Plan Note (Signed)
Chronic, stable back on prozac 40mg  daily. Will continue this regimen.

## 2017-10-09 NOTE — Patient Instructions (Addendum)
Flu shot today You are doing well today. Continue current medicines Work on low cholesterol diet, to help lower bad cholesterol levels.  Return as needed or in 1year for next physical  Health Maintenance, Female Adopting a healthy lifestyle and getting preventive care can go a long way to promote health and wellness. Talk with your health care provider about what schedule of regular examinations is right for you. This is a good chance for you to check in with your provider about disease prevention and staying healthy. In between checkups, there are plenty of things you can do on your own. Experts have done a lot of research about which lifestyle changes and preventive measures are most likely to keep you healthy. Ask your health care provider for more information. Weight and diet Eat a healthy diet  Be sure to include plenty of vegetables, fruits, low-fat dairy products, and lean protein.  Do not eat a lot of foods high in solid fats, added sugars, or salt.  Get regular exercise. This is one of the most important things you can do for your health. ? Most adults should exercise for at least 150 minutes each week. The exercise should increase your heart rate and make you sweat (moderate-intensity exercise). ? Most adults should also do strengthening exercises at least twice a week. This is in addition to the moderate-intensity exercise.  Maintain a healthy weight  Body mass index (BMI) is a measurement that can be used to identify possible weight problems. It estimates body fat based on height and weight. Your health care provider can help determine your BMI and help you achieve or maintain a healthy weight.  For females 67 years of age and older: ? A BMI below 18.5 is considered underweight. ? A BMI of 18.5 to 24.9 is normal. ? A BMI of 25 to 29.9 is considered overweight. ? A BMI of 30 and above is considered obese.  Watch levels of cholesterol and blood lipids  You should start  having your blood tested for lipids and cholesterol at 48 years of age, then have this test every 5 years.  You may need to have your cholesterol levels checked more often if: ? Your lipid or cholesterol levels are high. ? You are older than 48 years of age. ? You are at high risk for heart disease.  Cancer screening Lung Cancer  Lung cancer screening is recommended for adults 53-60 years old who are at high risk for lung cancer because of a history of smoking.  A yearly low-dose CT scan of the lungs is recommended for people who: ? Currently smoke. ? Have quit within the past 15 years. ? Have at least a 30-pack-year history of smoking. A pack year is smoking an average of one pack of cigarettes a day for 1 year.  Yearly screening should continue until it has been 15 years since you quit.  Yearly screening should stop if you develop a health problem that would prevent you from having lung cancer treatment.  Breast Cancer  Practice breast self-awareness. This means understanding how your breasts normally appear and feel.  It also means doing regular breast self-exams. Let your health care provider know about any changes, no matter how small.  If you are in your 20s or 30s, you should have a clinical breast exam (CBE) by a health care provider every 1-3 years as part of a regular health exam.  If you are 32 or older, have a CBE every year. Also consider  having a breast X-ray (mammogram) every year.  If you have a family history of breast cancer, talk to your health care provider about genetic screening.  If you are at high risk for breast cancer, talk to your health care provider about having an MRI and a mammogram every year.  Breast cancer gene (BRCA) assessment is recommended for women who have family members with BRCA-related cancers. BRCA-related cancers include: ? Breast. ? Ovarian. ? Tubal. ? Peritoneal cancers.  Results of the assessment will determine the need for  genetic counseling and BRCA1 and BRCA2 testing.  Cervical Cancer Your health care provider may recommend that you be screened regularly for cancer of the pelvic organs (ovaries, uterus, and vagina). This screening involves a pelvic examination, including checking for microscopic changes to the surface of your cervix (Pap test). You may be encouraged to have this screening done every 3 years, beginning at age 48.  For women ages 22-65, health care providers may recommend pelvic exams and Pap testing every 3 years, or they may recommend the Pap and pelvic exam, combined with testing for human papilloma virus (HPV), every 5 years. Some types of HPV increase your risk of cervical cancer. Testing for HPV may also be done on women of any age with unclear Pap test results.  Other health care providers may not recommend any screening for nonpregnant women who are considered low risk for pelvic cancer and who do not have symptoms. Ask your health care provider if a screening pelvic exam is right for you.  If you have had past treatment for cervical cancer or a condition that could lead to cancer, you need Pap tests and screening for cancer for at least 20 years after your treatment. If Pap tests have been discontinued, your risk factors (such as having a new sexual partner) need to be reassessed to determine if screening should resume. Some women have medical problems that increase the chance of getting cervical cancer. In these cases, your health care provider may recommend more frequent screening and Pap tests.  Colorectal Cancer  This type of cancer can be detected and often prevented.  Routine colorectal cancer screening usually begins at 48 years of age and continues through 48 years of age.  Your health care provider may recommend screening at an earlier age if you have risk factors for colon cancer.  Your health care provider may also recommend using home test kits to check for hidden blood in the  stool.  A small camera at the end of a tube can be used to examine your colon directly (sigmoidoscopy or colonoscopy). This is done to check for the earliest forms of colorectal cancer.  Routine screening usually begins at age 65.  Direct examination of the colon should be repeated every 5-10 years through 48 years of age. However, you may need to be screened more often if early forms of precancerous polyps or small growths are found.  Skin Cancer  Check your skin from head to toe regularly.  Tell your health care provider about any new moles or changes in moles, especially if there is a change in a mole's shape or color.  Also tell your health care provider if you have a mole that is larger than the size of a pencil eraser.  Always use sunscreen. Apply sunscreen liberally and repeatedly throughout the day.  Protect yourself by wearing long sleeves, pants, a wide-brimmed hat, and sunglasses whenever you are outside.  Heart disease, diabetes, and high blood pressure  High blood pressure causes heart disease and increases the risk of stroke. High blood pressure is more likely to develop in: ? People who have blood pressure in the high end of the normal range (130-139/85-89 mm Hg). ? People who are overweight or obese. ? People who are African American.  If you are 14-51 years of age, have your blood pressure checked every 3-5 years. If you are 50 years of age or older, have your blood pressure checked every year. You should have your blood pressure measured twice-once when you are at a hospital or clinic, and once when you are not at a hospital or clinic. Record the average of the two measurements. To check your blood pressure when you are not at a hospital or clinic, you can use: ? An automated blood pressure machine at a pharmacy. ? A home blood pressure monitor.  If you are between 6 years and 19 years old, ask your health care provider if you should take aspirin to prevent  strokes.  Have regular diabetes screenings. This involves taking a blood sample to check your fasting blood sugar level. ? If you are at a normal weight and have a low risk for diabetes, have this test once every three years after 48 years of age. ? If you are overweight and have a high risk for diabetes, consider being tested at a younger age or more often. Preventing infection Hepatitis B  If you have a higher risk for hepatitis B, you should be screened for this virus. You are considered at high risk for hepatitis B if: ? You were born in a country where hepatitis B is common. Ask your health care provider which countries are considered high risk. ? Your parents were born in a high-risk country, and you have not been immunized against hepatitis B (hepatitis B vaccine). ? You have HIV or AIDS. ? You use needles to inject street drugs. ? You live with someone who has hepatitis B. ? You have had sex with someone who has hepatitis B. ? You get hemodialysis treatment. ? You take certain medicines for conditions, including cancer, organ transplantation, and autoimmune conditions.  Hepatitis C  Blood testing is recommended for: ? Everyone born from [birth year] through 1967. ? Anyone with known risk factors for hepatitis C.  Sexually transmitted infections (STIs)  You should be screened for sexually transmitted infections (STIs) including gonorrhea and chlamydia if: ? You are sexually active and are younger than 48 years of age. ? You are older than 48 years of age and your health care provider tells you that you are at risk for this type of infection. ? Your sexual activity has changed since you were last screened and you are at an increased risk for chlamydia or gonorrhea. Ask your health care provider if you are at risk.  If you do not have HIV, but are at risk, it may be recommended that you take a prescription medicine daily to prevent HIV infection. This is called pre-exposure prophylaxis  (PrEP). You are considered at risk if: ? You are sexually active and do not regularly use condoms or know the HIV status of your partner(s). ? You take drugs by injection. ? You are sexually active with a partner who has HIV.  Talk with your health care provider about whether you are at high risk of being infected with HIV. If you choose to begin PrEP, you should first be tested for HIV. You should then be tested every 3 months  for as long as you are taking PrEP. Pregnancy  If you are premenopausal and you may become pregnant, ask your health care provider about preconception counseling.  If you may become pregnant, take 400 to 800 micrograms (mcg) of folic acid every day.  If you want to prevent pregnancy, talk to your health care provider about birth control (contraception). Osteoporosis and menopause  Osteoporosis is a disease in which the bones lose minerals and strength with aging. This can result in serious bone fractures. Your risk for osteoporosis can be identified using a bone density scan.  If you are 8 years of age or older, or if you are at risk for osteoporosis and fractures, ask your health care provider if you should be screened.  Ask your health care provider whether you should take a calcium or vitamin D supplement to lower your risk for osteoporosis.  Menopause may have certain physical symptoms and risks.  Hormone replacement therapy may reduce some of these symptoms and risks. Talk to your health care provider about whether hormone replacement therapy is right for you. Follow these instructions at home:  Schedule regular health, dental, and eye exams.  Stay current with your immunizations.  Do not use any tobacco products including cigarettes, chewing tobacco, or electronic cigarettes.  If you are pregnant, do not drink alcohol.  If you are breastfeeding, limit how much and how often you drink alcohol.  Limit alcohol intake to no more than 1 drink per day for  nonpregnant women. One drink equals 12 ounces of beer, 5 ounces of wine, or 1 ounces of hard liquor.  Do not use street drugs.  Do not share needles.  Ask your health care provider for help if you need support or information about quitting drugs.  Tell your health care provider if you often feel depressed.  Tell your health care provider if you have ever been abused or do not feel safe at home. This information is not intended to replace advice given to you by your health care provider. Make sure you discuss any questions you have with your health care provider. Document Released: 06/18/2011 Document Revised: 05/10/2016 Document Reviewed: 09/06/2015 Elsevier Interactive Patient Education  [employer].

## 2017-10-09 NOTE — Assessment & Plan Note (Addendum)
Chronic, progressive elevation noted. Reviewed healthy diet changes to improve chol levels. Low chol diet handout provided today.  The 10-year ASCVD risk score Denman George DC Montez Hageman., et al., 2013) is: 1.4%   Values used to calculate the score:     Age: 46 years     Sex: Female     Is Non-Hispanic African American: No     Diabetic: No     Tobacco smoker: No     Systolic Blood Pressure: 122 mmHg     Is BP treated: No     HDL Cholesterol: 55.2 mg/dL     Total Cholesterol: 267 mg/dL

## 2017-11-18 ENCOUNTER — Other Ambulatory Visit: Payer: Self-pay | Admitting: Family Medicine

## 2018-02-14 ENCOUNTER — Encounter: Payer: Self-pay | Admitting: Family Medicine

## 2018-02-14 ENCOUNTER — Ambulatory Visit (INDEPENDENT_AMBULATORY_CARE_PROVIDER_SITE_OTHER): Payer: BLUE CROSS/BLUE SHIELD | Admitting: Family Medicine

## 2018-02-14 VITALS — BP 120/66 | HR 64 | Temp 97.9°F | Wt 220.0 lb

## 2018-02-14 DIAGNOSIS — F3342 Major depressive disorder, recurrent, in full remission: Secondary | ICD-10-CM | POA: Diagnosis not present

## 2018-02-14 DIAGNOSIS — F640 Transsexualism: Secondary | ICD-10-CM | POA: Diagnosis not present

## 2018-02-14 DIAGNOSIS — R05 Cough: Secondary | ICD-10-CM | POA: Insufficient documentation

## 2018-02-14 DIAGNOSIS — R059 Cough, unspecified: Secondary | ICD-10-CM | POA: Insufficient documentation

## 2018-02-14 MED ORDER — OMEPRAZOLE 40 MG PO CPDR: 40.0000 mg | DELAYED_RELEASE_CAPSULE | Freq: Every day | ORAL | 1 refills | Status: DC

## 2018-02-14 NOTE — Assessment & Plan Note (Addendum)
Has tapered off prozac. Has decided to nurture feminine side. Involved in church, family relationships going well. Very happy.

## 2018-02-14 NOTE — Progress Notes (Signed)
BP 120/66 (BP Location: Left Arm, Patient Position: Sitting, Cuff Size: Normal)   Pulse 64   Temp 97.9 F (36.6 C) (Oral)   Wt 220 lb (99.8 kg)   LMP 07/17/2012   SpO2 94%   BMI 34.46 kg/m    CC: cough  Subjective:    Patient ID: Katherine Steele, female    DOB: November 25, 1971, 47 y.o.   MRN: [id number]  HPI: Katherine Steele is a 47 y.o. female presenting on 02/14/2018 for URI (Has had cold about 1 mo. Has dry cough, worse at night. Tried [facility] cold/cough med, no help.)   1 mo h/o nonproductive nagging cough, chest congestion. Some sneezing but that has quickly resolved.  No fevers/chills, ear or tooth pain, ST, PNDrainage.   No sick contacts at home.  Ex smoker.  No h/o asthma.   Weaned off prozac - may have caused grinding of teeth in her sleep and diarrhea - now better.  Ongoing pill dysphagia for months. Some solid food dysphagia, not recently however. Some dyspepsia she had treated with tums. But better since she stopped prozac.   Relevant past medical, surgical, family and social history reviewed and updated as indicated. Interim medical history since our last visit reviewed. Allergies and medications reviewed and updated. Outpatient Medications Prior to Visit  Medication Sig Dispense Refill  . Multiple Vitamin (MULTIVITAMIN) tablet Take 1 tablet by mouth daily.    Marland Kitchen FLUoxetine (PROZAC) 40 MG capsule TAKE 1 CAPSULE (40 MG TOTAL) BY MOUTH DAILY. 90 capsule 3   No facility-administered medications prior to visit.      Per HPI unless specifically indicated in ROS section below Review of Systems     Objective:    BP 120/66 (BP Location: Left Arm, Patient Position: Sitting, Cuff Size: Normal)   Pulse 64   Temp 97.9 F (36.6 C) (Oral)   Wt 220 lb (99.8 kg)   LMP 07/17/2012   SpO2 94%   BMI 34.46 kg/m   Wt Readings from Last 3 Encounters:  02/14/18 220 lb (99.8 kg)  10/09/17 220 lb 8 oz (100 kg)  02/26/17 213 lb (96.6 kg)    Physical Exam  Constitutional: She appears  well-developed and well-nourished. No distress.  HENT:  Head: Normocephalic and atraumatic.  Right Ear: Hearing, tympanic membrane, external ear and ear canal normal.  Left Ear: Hearing, tympanic membrane, external ear and ear canal normal.  Nose: No mucosal edema or rhinorrhea. Right sinus exhibits no maxillary sinus tenderness and no frontal sinus tenderness. Left sinus exhibits no maxillary sinus tenderness and no frontal sinus tenderness.  Mouth/Throat: Uvula is midline, oropharynx is clear and moist and mucous membranes are normal. No oropharyngeal exudate, posterior oropharyngeal edema, posterior oropharyngeal erythema or tonsillar abscesses.  Eyes: Conjunctivae and EOM are normal. Pupils are equal, round, and reactive to light. No scleral icterus.  Neck: Normal range of motion. Neck supple.  Cardiovascular: Normal rate, regular rhythm, normal heart sounds and intact distal pulses.  No murmur heard. Pulmonary/Chest: Effort normal and breath sounds normal. No respiratory distress. She has no wheezes. She has no rales.  Lymphadenopathy:    She has cervical adenopathy (shotty R sided).  Skin: Skin is warm and dry. No rash noted.  Nursing note and vitals reviewed.     Assessment & Plan:   Problem List Items Addressed This Visit    Cough - Primary    Anticipate GERD related cough - will Rx omeprazole 40mg  daily x 3 wks then PRN.  Not consistent with bacterial or likely viral infection at this time. Possible post-infectious cough. Discussed if ongoing dysphagia or dyspepsia to update [name] for further evaluation. Pt agrees with plan.       Gender dysphoria in adult    Has tapered off prozac. Has decided to nurture feminine side. Involved in church, family relationships going well. Very happy.       MDD (major depressive disorder)    Stable after taper off prozac.           Meds ordered this encounter  Medications  . omeprazole (PRILOSEC) 40 MG capsule    Sig: Take 1 capsule (40  mg total) by mouth daily.    Dispense:  30 capsule    Refill:  1   No orders of the defined types were placed in this encounter.   Follow up plan: No Follow-up on file.  Eustaquio Boyden, MD

## 2018-02-14 NOTE — Patient Instructions (Signed)
Lungs clear today.  Possible heartburn contributing to persistent cough.  Treat with omeprazole 40mg  daily for 3 weeks then as needed.  Update me with effect.  Let me know if fever >101, worsening productive or not improving with treatment.  Let me know if ongoing trouble swallowing.

## 2018-02-14 NOTE — Assessment & Plan Note (Signed)
Stable after taper off prozac.

## 2018-02-14 NOTE — Assessment & Plan Note (Signed)
Anticipate GERD related cough - will Rx omeprazole 40mg  daily x 3 wks then PRN.  Not consistent with bacterial or likely viral infection at this time. Possible post-infectious cough. Discussed if ongoing dysphagia or dyspepsia to update for further evaluation. Pt agrees with plan.

## 2018-02-18 ENCOUNTER — Encounter: Payer: Self-pay | Admitting: Family Medicine

## 2018-02-18 DIAGNOSIS — R059 Cough, unspecified: Secondary | ICD-10-CM

## 2018-02-18 DIAGNOSIS — R05 Cough: Secondary | ICD-10-CM

## 2018-02-18 DIAGNOSIS — K219 Gastro-esophageal reflux disease without esophagitis: Secondary | ICD-10-CM

## 2018-03-18 ENCOUNTER — Other Ambulatory Visit: Payer: Self-pay

## 2018-03-18 ENCOUNTER — Ambulatory Visit (INDEPENDENT_AMBULATORY_CARE_PROVIDER_SITE_OTHER): Payer: BLUE CROSS/BLUE SHIELD | Admitting: Gastroenterology

## 2018-03-18 ENCOUNTER — Encounter: Payer: Self-pay | Admitting: Gastroenterology

## 2018-03-18 VITALS — BP 124/73 | HR 77 | Ht 67.0 in | Wt 222.0 lb

## 2018-03-18 DIAGNOSIS — R1310 Dysphagia, unspecified: Secondary | ICD-10-CM | POA: Diagnosis not present

## 2018-03-18 DIAGNOSIS — K219 Gastro-esophageal reflux disease without esophagitis: Secondary | ICD-10-CM | POA: Diagnosis not present

## 2018-03-18 NOTE — Patient Instructions (Signed)
Bed wedge F/U 3 months  Gastroesophageal Reflux Disease, Adult Normally, food travels down the esophagus and stays in the stomach to be digested. If a person has gastroesophageal reflux disease (GERD), food and stomach acid move back up into the esophagus. When this happens, the esophagus becomes sore and swollen (inflamed). Over time, GERD can make small holes (ulcers) in the lining of the esophagus. Follow these instructions at home: Diet  Follow a diet as told by your doctor. You may need to avoid foods and drinks such as: ? Coffee and tea (with or without caffeine). ? Drinks that contain alcohol. ? Energy drinks and sports drinks. ? Carbonated drinks or sodas. ? Chocolate and cocoa. ? Peppermint and mint flavorings. ? Garlic and onions. ? Horseradish. ? Spicy and acidic foods, such as peppers, chili powder, curry powder, vinegar, hot sauces, and BBQ sauce. ? Citrus fruit juices and citrus fruits, such as oranges, lemons, and limes. ? Tomato-based foods, such as red sauce, chili, salsa, and pizza with red sauce. ? Fried and fatty foods, such as donuts, french fries, potato chips, and high-fat dressings. ? High-fat meats, such as hot dogs, rib eye steak, sausage, ham, and bacon. ? High-fat dairy items, such as whole milk, butter, and cream cheese.  Eat small meals often. Avoid eating large meals.  Avoid drinking large amounts of liquid with your meals.  Avoid eating meals during the 2-3 hours before bedtime.  Avoid lying down right after you eat.  Do not exercise right after you eat. General instructions  Pay attention to any changes in your symptoms.  Take over-the-counter and prescription medicines only as told by your doctor. Do not take aspirin, ibuprofen, or other NSAIDs unless your doctor says it is okay.  Do not use any tobacco products, including cigarettes, chewing tobacco, and e-cigarettes. If you need help quitting, ask your doctor.  Wear loose clothes. Do not  wear anything tight around your waist.  Raise (elevate) the head of your bed about 6 inches (15 cm).  Try to lower your stress. If you need help doing this, ask your doctor.  If you are overweight, lose an amount of weight that is healthy for you. Ask your doctor about a safe weight loss goal.  Keep all follow-up visits as told by your doctor. This is important. Contact a doctor if:  You have new symptoms.  You lose weight and you do not know why it is happening.  You have trouble swallowing, or it hurts to swallow.  You have wheezing or a cough that keeps happening.  Your symptoms do not get better with treatment.  You have a hoarse voice. Get help right away if:  You have pain in your arms, neck, jaw, teeth, or back.  You feel sweaty, dizzy, or light-headed.  You have chest pain or shortness of breath.  You throw up (vomit) and your throw up looks like blood or coffee grounds.  You pass out (faint).  Your poop (stool) is bloody or black.  You cannot swallow, drink, or eat. This information is not intended to replace advice given to you by your health care provider. Make sure you discuss any questions you have with your health care provider. Document Released: 05/21/2008 Document Revised: 05/10/2016 Document Reviewed: 03/30/2015 Elsevier Interactive Patient Education  2018 Elsevier Inc.  

## 2018-03-18 NOTE — Progress Notes (Signed)
Katherine Steele [address]  [address]  [place], [place] [address]  Main: [phone]  Fax: [phone]   Gastroenterology Consultation  Referring Provider:     Eustaquio Boyden, MD Primary Care Physician:  Eustaquio Boyden, MD Primary Gastroenterologist:  Dr. Melodie Steele Reason for Consultation:   Dysphagia, heartburn        HPI:   Katherine Steele is a 47 y.o. y/o female referred for consultation & management  by Dr. Eustaquio Boyden, MD.  Patient reported one year history of intermittent dysphagia to solid foods and liquid, occurring 2-3 times a week.  She denies having to bring food back up, however, states she is to swallow slowly, avoid meats or hard foods, and drinks a lot of water with her foods and then is able to swallow them.  Points to her throat, states it gets stuck there.  She also reports 72-month history of heartburn intermittently, 2-3 times a week.,  No weight loss.  No abdominal pain.  No nausea or vomiting.  She was started on Prilosec by her primary care provider, 2-3 weeks ago, and states the symptoms have improved with the medication.  No hematochezia, melena.  No weight loss.  No family history of colon cancer.  Past Medical History:  Diagnosis Date  . Endometriosis    s/p hysterectomy  . GAD (generalized anxiety disorder)    Dr. Lucianne Muss psychiatrist  . Gender dysphoria 2014   did undergo 10 mo testosterone treatment  . HLD (hyperlipidemia) 08/12/2014   Mild off meds   . MDD (major depressive disorder)    Dr. Lucianne Muss psychiatrist    Past Surgical History:  Procedure Laterality Date  . CESAREAN SECTION    . COLONOSCOPY  2007   WNL [facility])  . FINGER SURGERY Right 2007   little finger  . OOPHORECTOMY Left 2006  . TONSILLECTOMY  1975  . TOTAL ABDOMINAL HYSTERECTOMY  12/2012   endometriosis and ovarian cysts s/p ovaries removed  . TYMPANOSTOMY TUBE PLACEMENT Bilateral 1976    Prior to Admission medications   Medication Sig Start Date  End Date Taking? Authorizing Provider  Multiple Vitamin (MULTIVITAMIN) tablet Take 1 tablet by mouth daily.   Yes [provider]  omeprazole (PRILOSEC) 40 MG capsule Take 1 capsule (40 mg total) by mouth daily. 02/14/18  Yes Eustaquio Boyden, MD    Family History  Problem Relation Age of Onset  . Diabetes Mother   . Cancer Mother 78       ovarian with mets  . Hypertension Father   . Alcohol abuse Father        history  . Autism spectrum disorder Son   . CAD Maternal Grandfather 60       MI  . CAD Maternal Uncle 70       MI  . Stroke Neg Hx      Social History   Tobacco Use  . Smoking status: Former Smoker    Types: Cigarettes    Last attempt to quit: 02/06/1993    Years since quitting: 25.1  . Smokeless tobacco: Never Used  Substance Use Topics  . Alcohol use: Yes    Comment: social/weekends  . Drug use: No    Allergies as of 03/18/2018  . (No Known Allergies)    Review of Systems:    All systems reviewed and negative except where noted in HPI.   Physical Exam:  BP 124/73   Pulse 77   Ht 5\' 7"  (1.702 m)  Wt 222 lb (100.7 kg)   LMP 07/17/2012   BMI 34.77 kg/m  Patient's last menstrual period was 07/17/2012. Psych:  Alert and cooperative. Normal mood and affect. General:   Alert,  Well-developed, well-nourished, pleasant and cooperative in NAD Head:  Normocephalic and atraumatic. Eyes:  Sclera clear, no icterus.   Conjunctiva pink. Ears:  Normal auditory acuity. Nose:  No deformity, discharge, or lesions. Mouth:  No deformity or lesions,oropharynx pink & moist. Neck:  Supple; no masses or thyromegaly. Lungs:  Respirations even and unlabored.  Clear throughout to auscultation.   No wheezes, crackles, or rhonchi. No acute distress. Heart:  Regular rate and rhythm; no murmurs, clicks, rubs, or gallops. Abdomen:  Normal bowel sounds.  No bruits.  Soft, non-tender and non-distended without masses, hepatosplenomegaly or hernias noted.  No guarding or  rebound tenderness.    Msk:  Symmetrical without gross deformities. Good, equal movement & strength bilaterally. Pulses:  Normal pulses noted. Extremities:  No clubbing or edema.  No cyanosis. Neurologic:  Alert and oriented x3;  grossly normal neurologically. Skin:  Intact without significant lesions or rashes. No jaundice. Lymph Nodes:  No significant cervical adenopathy. Psych:  Alert and cooperative. Normal mood and affect.   Labs: CBC    Component Value Date/Time   WBC 5.1 05/07/2014 0848   RBC 4.24 05/07/2014 0848   HGB 13.4 05/07/2014 0848   HGB 11.8 (L) 12/30/2012 0510   HCT 40.0 05/07/2014 0848   HCT 39.6 12/25/2012 1320   PLT 250.0 05/07/2014 0848   PLT 289 12/25/2012 1320   MCV 94.3 05/07/2014 0848   MCV 90 12/25/2012 1320   MCH 29.8 12/25/2012 1320   MCHC 33.4 05/07/2014 0848   RDW 13.2 05/07/2014 0848   RDW 13.8 12/25/2012 1320   LYMPHSABS 1.5 05/07/2014 0848   MONOABS 0.3 05/07/2014 0848   EOSABS 0.2 05/07/2014 0848   BASOSABS 0.0 05/07/2014 0848   CMP     Component Value Date/Time   NA 141 09/27/2017 0751   K 4.1 09/27/2017 0751   CL 105 09/27/2017 0751   CO2 28 09/27/2017 0751   GLUCOSE 87 09/27/2017 0751   BUN 12 09/27/2017 0751   CREATININE 0.59 09/27/2017 0751   CALCIUM 9.0 09/27/2017 0751   PROT 7.0 09/27/2017 0751   ALBUMIN 4.2 09/27/2017 0751   AST 15 09/27/2017 0751   ALT 15 09/27/2017 0751   ALKPHOS 85 09/27/2017 0751   BILITOT 0.8 09/27/2017 0751    Imaging Studies: Abdominal ultrasound October 2017, stable appearance of liver and spleen since 2014.  No hepatosplenomegaly.  Colonoscopy in 2008, per chronic diarrhea, good prep, extent of exam cecum, normal except internal hemorrhoids.  Biopsies obtained (biopsy report not available)  Assessment and Plan:   Katherine Steele is a 47 y.o. y/o female has been referred for dysphagia and GERD  Due to 1 year history of dysphagia, EGD is indicated to evaluate for any ring strictures  narrowing, EOE Continue Prilosec Patient educated extensively on acid reflux lifestyle modification, including buying a bed wedge, not eating 3 hrs before bedtime, diet modifications, and handout given for the same.  Patient dysphagia is improved with PPI (Risks of PPI use were discussed with patient including bone loss, C. Diff diarrhea, pneumonia, infections, CKD, electrolyte abnormalities.  If clinically possible based on symptoms, goal would be to maintain patient on the lowest dose possible, or discontinue the medication with institution of acid reflux lifestyle modifications over time. Pt. Verbalizes understanding and chooses to  continue the medication.)  Benefits of PPI use outweigh risks at this time, given that patient had dysphagia and it improved with PPI  Patient's last colonoscopy was reviewed Based on new [employer] guidelines, the recommend screening colonoscopy at 47 years of age, screening colonoscopy was offered to patient along with her EGD, versus waiting another 2 years until she is 2.  She is choosing to wait another 1 or 2 years before proceeding with colonoscopy.  In the meantime, I have asked her to discuss it with her primary care provider, if she would like to undergo any alternative screening methods.  She verbalized understanding. She denies any diarrhea or constipation at this time.  Dr Katherine Steele

## 2018-03-21 ENCOUNTER — Encounter
Admission: RE | Disposition: A | Discharge status: Home / Self Care | Payer: Self-pay | Source: Ambulatory Visit | Attending: Gastroenterology

## 2018-03-21 ENCOUNTER — Ambulatory Visit: Payer: BLUE CROSS/BLUE SHIELD | Admitting: Anesthesiology

## 2018-03-21 ENCOUNTER — Ambulatory Visit
Admission: RE | Admit: 2018-03-21 | Discharge: 2018-03-21 | Disposition: A | Discharge status: Home / Self Care | Payer: BLUE CROSS/BLUE SHIELD | Source: Ambulatory Visit | Attending: Gastroenterology | Admitting: Gastroenterology

## 2018-03-21 DIAGNOSIS — Z87891 Personal history of nicotine dependence: Secondary | ICD-10-CM | POA: Insufficient documentation

## 2018-03-21 DIAGNOSIS — K529 Noninfective gastroenteritis and colitis, unspecified: Secondary | ICD-10-CM | POA: Diagnosis not present

## 2018-03-21 DIAGNOSIS — E785 Hyperlipidemia, unspecified: Secondary | ICD-10-CM | POA: Insufficient documentation

## 2018-03-21 DIAGNOSIS — F329 Major depressive disorder, single episode, unspecified: Secondary | ICD-10-CM | POA: Insufficient documentation

## 2018-03-21 DIAGNOSIS — K2270 Barrett's esophagus without dysplasia: Secondary | ICD-10-CM

## 2018-03-21 DIAGNOSIS — Z79899 Other long term (current) drug therapy: Secondary | ICD-10-CM | POA: Diagnosis not present

## 2018-03-21 DIAGNOSIS — K222 Esophageal obstruction: Secondary | ICD-10-CM | POA: Diagnosis not present

## 2018-03-21 DIAGNOSIS — K228 Other specified diseases of esophagus: Secondary | ICD-10-CM

## 2018-03-21 DIAGNOSIS — K2950 Unspecified chronic gastritis without bleeding: Secondary | ICD-10-CM | POA: Diagnosis not present

## 2018-03-21 DIAGNOSIS — R1319 Other dysphagia: Secondary | ICD-10-CM

## 2018-03-21 DIAGNOSIS — K219 Gastro-esophageal reflux disease without esophagitis: Secondary | ICD-10-CM

## 2018-03-21 DIAGNOSIS — K3189 Other diseases of stomach and duodenum: Secondary | ICD-10-CM

## 2018-03-21 DIAGNOSIS — R12 Heartburn: Secondary | ICD-10-CM

## 2018-03-21 DIAGNOSIS — K449 Diaphragmatic hernia without obstruction or gangrene: Secondary | ICD-10-CM

## 2018-03-21 DIAGNOSIS — R1310 Dysphagia, unspecified: Secondary | ICD-10-CM

## 2018-03-21 DIAGNOSIS — F419 Anxiety disorder, unspecified: Secondary | ICD-10-CM | POA: Diagnosis not present

## 2018-03-21 HISTORY — PX: ESOPHAGOGASTRODUODENOSCOPY (EGD) WITH PROPOFOL: SHX5813

## 2018-03-21 SURGERY — ESOPHAGOGASTRODUODENOSCOPY (EGD) WITH PROPOFOL
Anesthesia: General

## 2018-03-21 MED ORDER — PROPOFOL 500 MG/50ML IV EMUL
INTRAVENOUS | Status: AC
  Filled 2018-03-21: qty 100

## 2018-03-21 MED ORDER — GLYCOPYRROLATE 0.2 MG/ML IJ SOLN
INTRAMUSCULAR | Status: DC | PRN
  Administered 2018-03-21: 0.2 mg via INTRAVENOUS

## 2018-03-21 MED ORDER — MIDAZOLAM HCL 5 MG/5ML IJ SOLN
INTRAMUSCULAR | Status: DC | PRN
  Administered 2018-03-21: 2 mg via INTRAVENOUS

## 2018-03-21 MED ORDER — LIDOCAINE 2% (20 MG/ML) 5 ML SYRINGE
INTRAMUSCULAR | Status: DC | PRN
  Administered 2018-03-21: 25 mg via INTRAVENOUS

## 2018-03-21 MED ORDER — GLYCOPYRROLATE 0.2 MG/ML IJ SOLN
INTRAMUSCULAR | Status: AC
  Filled 2018-03-21: qty 1

## 2018-03-21 MED ORDER — MIDAZOLAM HCL 2 MG/2ML IJ SOLN
INTRAMUSCULAR | Status: AC
  Filled 2018-03-21: qty 2

## 2018-03-21 MED ORDER — LIDOCAINE HCL (PF) 2 % IJ SOLN
INTRAMUSCULAR | Status: AC
  Filled 2018-03-21: qty 10

## 2018-03-21 MED ORDER — PROPOFOL 10 MG/ML IV BOLUS
INTRAVENOUS | Status: AC
  Filled 2018-03-21: qty 20

## 2018-03-21 MED ORDER — SODIUM CHLORIDE 0.9 % IV SOLN
INTRAVENOUS | Status: DC
  Administered 2018-03-21: 1000 mL via INTRAVENOUS

## 2018-03-21 MED ORDER — PROPOFOL 500 MG/50ML IV EMUL
INTRAVENOUS | Status: DC | PRN
  Administered 2018-03-21: 120 ug/kg/min via INTRAVENOUS

## 2018-03-21 MED ORDER — PROPOFOL 10 MG/ML IV BOLUS
INTRAVENOUS | Status: DC | PRN
  Administered 2018-03-21 (×2): 50 mg via INTRAVENOUS

## 2018-03-21 NOTE — Anesthesia Preprocedure Evaluation (Signed)
Anesthesia Evaluation  Patient identified by MRN, date of birth, ID band Patient awake    Reviewed: Allergy & Precautions, H&P , NPO status , Patient's Chart, lab work & pertinent test results, reviewed documented beta blocker date and time   Airway Mallampati: II   Neck ROM: full    Dental  (+) Poor Dentition   Pulmonary neg pulmonary ROS, former smoker,    Pulmonary exam normal        Cardiovascular negative cardio ROS Normal cardiovascular exam Rhythm:regular Rate:Normal     Neuro/Psych PSYCHIATRIC DISORDERS Anxiety Depression negative neurological ROS  negative psych ROS   GI/Hepatic negative GI ROS, Neg liver ROS,   Endo/Other  negative endocrine ROS  Renal/GU negative Renal ROS  negative genitourinary   Musculoskeletal   Abdominal   Peds  Hematology negative hematology ROS (+)   Anesthesia Other Findings Past Medical History: No date: Endometriosis     Comment:  s/p hysterectomy No date: GAD (generalized anxiety disorder)     Comment:  Dr. Lucianne Muss psychiatrist 2014: Gender dysphoria     Comment:  did undergo 10 mo testosterone treatment 08/12/2014: HLD (hyperlipidemia)     Comment:  Mild off meds  No date: MDD (major depressive disorder)     Comment:  Dr. Lucianne Muss psychiatrist Past Surgical History: No date: CESAREAN SECTION 2007: COLONOSCOPY     Comment:  WNL ([facility]) 2007: FINGER SURGERY; Right     Comment:  little finger 2006: OOPHORECTOMY; Left 1975: TONSILLECTOMY 12/2012: TOTAL ABDOMINAL HYSTERECTOMY     Comment:  endometriosis and ovarian cysts s/p ovaries removed 1976: TYMPANOSTOMY TUBE PLACEMENT; Bilateral   Reproductive/Obstetrics negative OB ROS                             Anesthesia Physical Anesthesia Plan  ASA: II  Anesthesia Plan: General   Post-op Pain Management:    Induction:   PONV Risk Score and Plan:   Airway Management Planned:   Additional  Equipment:   Intra-op Plan:   Post-operative Plan:   Informed Consent: I have reviewed the patients History and Physical, chart, labs and discussed the procedure including the risks, benefits and alternatives for the proposed anesthesia with the patient or authorized representative who has indicated his/her understanding and acceptance.   Dental Advisory Given  Plan Discussed with: CRNA  Anesthesia Plan Comments:         Anesthesia Quick Evaluation

## 2018-03-21 NOTE — Transfer of Care (Signed)
Immediate Anesthesia Transfer of Care Note  Patient: Katherine Steele  Procedure(s) Performed: ESOPHAGOGASTRODUODENOSCOPY (EGD) WITH PROPOFOL (N/A )  Patient Location: Endoscopy Unit  Anesthesia Type:General  Level of Consciousness: awake  Airway & Oxygen Therapy: Patient Spontanous Breathing and Patient connected to nasal cannula oxygen  Post-op Assessment: Report given to RN and Post -op Vital signs reviewed and stable  Post vital signs: Reviewed  Last Vitals:  Vitals Value Taken Time  BP 93/69 03/21/2018  8:43 AM  Temp 36.1 C 03/21/2018  8:43 AM  Pulse 81 03/21/2018  8:43 AM  Resp 18 03/21/2018  8:43 AM  SpO2 98 % 03/21/2018  8:43 AM    Last Pain:  Vitals:   03/21/18 0842  TempSrc: Tympanic  PainSc: 0-No pain         Complications: No apparent anesthesia complications

## 2018-03-21 NOTE — H&P (Signed)
Melodie Bouillon, MD [address], [address], [place], [place], [address] [address], [place], [place], [address] Phone: [phone]  Fax: [phone]  Primary Care Physician:  Eustaquio Boyden, MD   Pre-Procedure History & Physical: HPI:  Katherine Steele is a 47 y.o. female is here for an EGD.   Past Medical History:  Diagnosis Date  . Endometriosis    s/p hysterectomy  . GAD (generalized anxiety disorder)    Dr. Lucianne Muss psychiatrist  . Gender dysphoria 2014   did undergo 10 mo testosterone treatment  . HLD (hyperlipidemia) 08/12/2014   Mild off meds   . MDD (major depressive disorder)    Dr. Lucianne Muss psychiatrist    Past Surgical History:  Procedure Laterality Date  . CESAREAN SECTION    . COLONOSCOPY  2007   WNL [facility])  . FINGER SURGERY Right 2007   little finger  . OOPHORECTOMY Left 2006  . TONSILLECTOMY  1975  . TOTAL ABDOMINAL HYSTERECTOMY  12/2012   endometriosis and ovarian cysts s/p ovaries removed  . TYMPANOSTOMY TUBE PLACEMENT Bilateral 1976    Prior to Admission medications   Medication Sig Start Date End Date Taking? Authorizing Provider  Multiple Vitamin (MULTIVITAMIN) tablet Take 1 tablet by mouth daily.   Yes [provider]  omeprazole (PRILOSEC) 40 MG capsule Take 1 capsule (40 mg total) by mouth daily. 02/14/18  Yes Eustaquio Boyden, MD    Allergies as of 03/18/2018  . (No Known Allergies)    Family History  Problem Relation Age of Onset  . Diabetes Mother   . Cancer Mother 53       ovarian with mets  . Hypertension Father   . Alcohol abuse Father        history  . Autism spectrum disorder Son   . CAD Maternal Grandfather 60       MI  . CAD Maternal Uncle 70       MI  . Stroke Neg Hx     Social History   Socioeconomic History  . Marital status: Married    Spouse name: Not on file  . Number of children: Not on file  . Years of education: Not on file  . Highest education level: Not on file  Occupational  History  . Not on file  Social Needs  . Financial resource strain: Not on file  . Food insecurity:    Worry: Not on file    Inability: Not on file  . Transportation needs:    Medical: Not on file    Non-medical: Not on file  Tobacco Use  . Smoking status: Former Smoker    Types: Cigarettes    Last attempt to quit: 02/06/1993    Years since quitting: 25.1  . Smokeless tobacco: Never Used  Substance and Sexual Activity  . Alcohol use: Yes    Comment: social/weekends  . Drug use: No  . Sexual activity: Yes  Lifestyle  . Physical activity:    Days per week: Not on file    Minutes per session: Not on file  . Stress: Not on file  Relationships  . Social connections:    Talks on phone: Not on file    Gets together: Not on file    Attends religious service: Not on file    Active member of club or organization: Not on file    Attends meetings of clubs or organizations: Not on file    Relationship status: Not on file  . Intimate  partner violence:    Fear of current or ex partner: Not on file    Emotionally abused: Not on file    Physically abused: Not on file    Forced sexual activity: Not on file  Other Topics Concern  . Not on file  Social History Narrative   Lives with husband [name]) and son [name])   Occupation: Lexicographer   Edu: Associate's degree   Activity: exercise 3x/wk   Diet: good water, fruits/vegetables some    Review of Systems: See HPI, otherwise negative ROS  Physical Exam: BP 122/73   Pulse 61   Temp 98.6 F (37 C) (Tympanic)   Resp 20   Ht 5\' 7"  (1.702 m)   Wt 222 lb (100.7 kg)   LMP 07/17/2012   SpO2 98%   BMI 34.77 kg/m  General:   Alert,  pleasant and cooperative in NAD Head:  Normocephalic and atraumatic. Neck:  Supple; no masses or thyromegaly. Lungs:  Clear throughout to auscultation, normal respiratory effort.    Heart:  +S1, +S2, Regular rate and rhythm, No edema. Abdomen:  Soft, nontender and nondistended. Normal bowel  sounds, without guarding, and without rebound.   Neurologic:  Alert and  oriented x4;  grossly normal neurologically.  Impression/Plan: Katherine Steele is here for an EGD for Acid Reflux and dysphagia.  Risks, benefits, limitations, and alternatives regarding the procedure have been reviewed with the patient.  Questions have been answered.  All parties agreeable.   Lovie Macadamia, MD  03/21/2018, 8:11 AM

## 2018-03-21 NOTE — Anesthesia Postprocedure Evaluation (Signed)
Anesthesia Post Note  Patient: Katherine Steele  Procedure(s) Performed: ESOPHAGOGASTRODUODENOSCOPY (EGD) WITH PROPOFOL (N/A )  Patient location during evaluation: PACU Anesthesia Type: General Level of consciousness: awake and alert Pain management: pain level controlled Vital Signs Assessment: post-procedure vital signs reviewed and stable Respiratory status: spontaneous breathing, nonlabored ventilation, respiratory function stable and patient connected to nasal cannula oxygen Cardiovascular status: blood pressure returned to baseline and stable Postop Assessment: no apparent nausea or vomiting Anesthetic complications: no     Last Vitals:  Vitals:   03/21/18 0902 03/21/18 0912  BP: (!) 107/42 (!) 100/43  Pulse:    Resp: 14 19  Temp:    SpO2: 98% 98%    Last Pain:  Vitals:   03/21/18 0842  TempSrc: Tympanic  PainSc: 0-No pain                 Yevette Edwards

## 2018-03-21 NOTE — Op Note (Signed)
[facility] Gastroenterology Patient Name: Katherine Steele Procedure Date: 03/21/2018 8:07 AM MRN: [id number] Account #: [id number] Date of Birth: 1971/07/30 Admit Type: Outpatient Age: 47 Room: [facility] ENDO ROOM 2 Gender: Female Note Status: Finalized Procedure:            Upper GI endoscopy Indications:          Dysphagia, Heartburn Providers:            Jemaine Prokop B. [name] MD, MD Referring MD:         Eustaquio Boyden (Referring MD) Medicines:            Monitored Anesthesia Care Complications:        No immediate complications. Procedure:            Pre-Anesthesia Assessment:                       - Prior to the procedure, a History and Physical was                        performed, and patient medications, allergies and                        sensitivities were reviewed. The patient's tolerance of                        previous anesthesia was reviewed.                       - The risks and benefits of the procedure and the                        sedation options and risks were discussed with the                        patient. All questions were answered and informed                        consent was obtained.                       - Patient identification and proposed procedure were                        verified prior to the procedure by the physician, the                        nurse, the anesthesiologist, the anesthetist and the                        technician. The procedure was verified in the procedure                        room.                       - ASA Grade Assessment: II - A patient with mild                        systemic disease.                       After obtaining  informed consent, the endoscope was                        passed under direct vision. Throughout the procedure,                        the patient's blood pressure, pulse, and oxygen                        saturations were monitored continuously. The Endoscope       was introduced through the mouth, and advanced to the                        second part of duodenum. The upper GI endoscopy was                        accomplished with ease. The patient tolerated the                        procedure well. Findings:      The Z-line was regular and was found 38 cm from the incisors.      A low-grade of narrowing and non-obstructing Schatzki ring was found in       the distal esophagus. A TTS dilator was passed through the scope.       Dilation with a 15-16.5-18 mm balloon dilator was performed to 16.5 mm.       The dilation site was examined and showed mild heme effect, consistent       with effective dilation.      Mucosal changes including feline appearance were found in the mid       esophagus and in the distal esophagus. Biopsies were obtained from the       proximal and distal esophagus with cold forceps for histology of       suspected eosinophilic esophagitis.      Circumferential salmon-colored mucosa was present at 17-19 cm. No other       visible abnormalities were present. The maximum longitudinal extent of       these esophageal mucosal changes was 2 cm in length. This could       represent Gastric inlet patch but due to the circumferential appearance       of the mucosa biopsies were done to rule out Barrett's.      Patchy mildly erythematous mucosa without bleeding was found in the       gastric antrum. Biopsies were taken with a cold forceps for histology.       Biopsies were obtained in the gastric body, at the incisura and in the       gastric antrum with cold forceps for histology.      A 1 cm hiatal hernia was present.      The duodenal bulb, second portion of the duodenum and examined duodenum       were normal. Impression:           - Z-line regular, 38 cm from the incisors.                       - Low-grade of narrowing and non-obstructing Schatzki                        ring. Dilated.                       -  Esophageal mucosal  changes suggestive of eosinophilic                        esophagitis. Biopsied.                       - Salmon-colored mucosa in the proximal esophagus from                        17 - 19 cm. It was circumferential in nature and larger                        in size than a typical gastric inlet patch. Biopsies                        were done to evaluate if this represents Barrett's.                       - Erythematous mucosa in the antrum. Biopsied.                       - 1 cm hiatal hernia.                       - Normal duodenal bulb, second portion of the duodenum                        and examined duodenum.                       - Biopsies were obtained in the gastric body, at the                        incisura and in the gastric antrum. Recommendation:       - Await pathology results.                       - Discharge patient to home (with escort).                       - Advance diet as tolerated.                       - Continue present medications.                       - Take prescribed proton pump inhibitor or H2 blocker                        (antacid) medications 30 - 60 minutes before meals.                       - Patient has a contact number available for                        emergencies. The signs and symptoms of potential                        delayed complications were discussed with the patient.  Return to normal activities tomorrow. Written discharge                        instructions were provided to the patient.                       - Discharge patient to home (with escort).                       - The findings and recommendations were discussed with                        the patient.                       - The findings and recommendations were discussed with                        the patient's family.                       - Return to my office as previously scheduled. Procedure Code(s):    --- Professional ---                        [phone], Esophagogastroduodenoscopy, flexible, transoral;                        with transendoscopic balloon dilation of esophagus                        (less than 30 mm diameter)                       43239, Esophagogastroduodenoscopy, flexible, transoral;                        with biopsy, single or multiple Diagnosis Code(s):    --- Professional ---                       K22.8, Other specified diseases of esophagus                       K22.2, Esophageal obstruction                       K31.89, Other diseases of stomach and duodenum                       K44.9, Diaphragmatic hernia without obstruction or                        gangrene                       R13.10, Dysphagia, unspecified                       R12, Heartburn CPT copyright 2017 American Medical Association. All rights reserved. The codes documented in this report are preliminary and upon coder review may  be revised to meet current compliance requirements.  Melodie Bouillon, MD Michel Bickers B. [name] MD, MD 03/21/2018 8:47:25 AM This report has been signed electronically. Number of Addenda: 0 Note Initiated On: 03/21/2018 8:07  AM Estimated Blood Loss: Estimated blood loss: none.      [facility]

## 2018-03-21 NOTE — Anesthesia Post-op Follow-up Note (Signed)
Anesthesia QCDR form completed.        

## 2018-03-24 ENCOUNTER — Encounter: Payer: Self-pay | Admitting: Gastroenterology

## 2018-03-25 LAB — SURGICAL PATHOLOGY

## 2018-03-26 ENCOUNTER — Encounter: Payer: Self-pay | Admitting: Family Medicine

## 2018-04-16 ENCOUNTER — Encounter: Payer: Self-pay | Admitting: Family Medicine

## 2018-04-17 ENCOUNTER — Other Ambulatory Visit: Payer: Self-pay | Admitting: Family Medicine

## 2018-05-15 ENCOUNTER — Telehealth: Payer: Self-pay | Admitting: Gastroenterology

## 2018-05-15 NOTE — Telephone Encounter (Signed)
Left vm for pt to call office and schedule 2-3 week fu per Dr. [name]

## 2018-06-13 ENCOUNTER — Other Ambulatory Visit: Payer: Self-pay | Admitting: Family Medicine

## 2018-06-18 ENCOUNTER — Encounter: Payer: Self-pay | Admitting: Gastroenterology

## 2018-06-18 ENCOUNTER — Ambulatory Visit (INDEPENDENT_AMBULATORY_CARE_PROVIDER_SITE_OTHER): Payer: BLUE CROSS/BLUE SHIELD | Admitting: Gastroenterology

## 2018-06-18 VITALS — BP 95/58 | HR 58 | Wt 213.2 lb

## 2018-06-18 DIAGNOSIS — K2270 Barrett's esophagus without dysplasia: Secondary | ICD-10-CM | POA: Diagnosis not present

## 2018-06-18 DIAGNOSIS — K219 Gastro-esophageal reflux disease without esophagitis: Secondary | ICD-10-CM

## 2018-06-18 MED ORDER — OMEPRAZOLE 20 MG PO CPDR: 20.0000 mg | DELAYED_RELEASE_CAPSULE | Freq: Every day | ORAL | 5 refills | Status: DC

## 2018-06-18 NOTE — Progress Notes (Signed)
During pt's office visit her B/P 83/53, large cuff, rechecked with normal cuff B/P 89/56, P-57. Pt states she just woke up from a 10 minute nap, feels a little tired but no other complaints. Dr. [name] notified. Pt was given a large cup of water and was rechecked in 10 minutes. B/P 95/58, P-58. Continues to deny any dizziness, lethargy. Encouraged to contact PCP, monitor her B/P while at home and if she becomes symptomatic to go to ED.

## 2018-06-18 NOTE — Progress Notes (Signed)
Katherine Bouillon, MD [address]  [address]  [place], [place] [address]  Main: [phone]  Fax: [phone]   Primary Care Physician: Katherine Boyden, MD  Primary Gastroenterologist:  Dr. Melodie Steele  Chief complaint: Follow-up for dysphagia and heartburn  HPI: Katherine Steele is a 47 y.o. female initially seen in April 29, with chief complaint of intermittent dysphagia and heartburn. Since then she had an EGD, that showed a Schatzki's ring with low-grade narrowing, that was dilated to 16.5 mm with TTS balloon dilator.  Patient reports results dysphagia at this time.  Able to tolerate solids and liquids without difficulty.  Denies any further heartburn as well on once daily PPI.  No abdominal pain, weight loss  in addition, circumferential salmon-colored mucosa was seen at 17 to 19 cm in the esophagus.  Hiatal hernia, and gastric erythema was also noted.  DIAGNOSIS:  A. STOMACH, ANTRUM BODY AND ANGULARIS; COLD BIOPSY:  - ANTRAL AND OXYNTIC MUCOSA WITH MILD CHRONIC GASTRITIS.  - NEGATIVE FOR H. PYLORI, DYSPLASIA AND MALIGNANCY.   B. DISTAL ESOPHAGUS; COLD BIOPSY:  - CHANGES MOST CONSISTENT WITH REFLUX.  - NEGATIVE FOR INCREASED EOSINOPHILS, DYSPLASIA OR MALIGNANCY.   C. PROXIMAL ESOPHAGUS; COLD BIOPSY:  - RARE EOSINOPHIL (UP TO 1 IN A HIGH POWER FIELD).  - NEGATIVE FOR FEATURES OF EOSINOPHILIC ESOPHAGITIS, DYSPLASIA OR  MALIGNANCY.   D. SALMON COLORED MUCOSA AT 18 CM; COLD BIOPSY:  - REFLUX GASTROENTERITIS AND FOCAL INTESTINAL METAPLASIA, CONSISTENT  WITH BARRETT'S ESOPHAGUS GIVEN ENDOSCOPIC FINDINGS.  - NEGATIVE FOR DYSPLASIA AND MALIGNANCY.   Current Outpatient Medications  Medication Sig Dispense Refill  . Multiple Vitamin (MULTIVITAMIN) tablet Take 1 tablet by mouth daily.    Marland Kitchen omeprazole (PRILOSEC) 40 MG capsule TAKE 1 CAPSULE BY MOUTH EVERY DAY 30 capsule 4   No current facility-administered medications for this visit.     Allergies as of  06/18/2018  . (No Known Allergies)    ROS:  General: Negative for anorexia, weight loss, fever, chills, fatigue, weakness. ENT: Negative for hoarseness, difficulty swallowing , nasal congestion. CV: Negative for chest pain, angina, palpitations, dyspnea on exertion, peripheral edema.  Respiratory: Negative for dyspnea at rest, dyspnea on exertion, cough, sputum, wheezing.  GI: See history of present illness. GU:  Negative for dysuria, hematuria, urinary incontinence, urinary frequency, nocturnal urination.  Endo: Negative for unusual weight change.    Physical Examination:  Vitals:   06/18/18 1523 06/18/18 1549  BP: (!) 89/56 (!) 95/58  Pulse: (!) 57 (!) 58  Weight: 213 lb 3.2 oz (96.7 kg)       LMP 07/17/2012   General: Well-nourished, well-developed in no acute distress.  Eyes: No icterus. Conjunctivae pink. Mouth: Oropharyngeal mucosa moist and pink , no lesions erythema or exudate. Neck: Supple, Trachea midline Abdomen: Bowel sounds are normal, nontender, nondistended, no hepatosplenomegaly or masses, no abdominal bruits or hernia , no rebound or guarding.   Extremities: No lower extremity edema. No clubbing or deformities. Neuro: Alert and oriented x 3.  Grossly intact. Skin: Warm and dry, no jaundice.   Psych: Alert and cooperative, normal mood and affect.   Labs: CMP     Component Value Date/Time   NA 141 09/27/2017 0751   K 4.1 09/27/2017 0751   CL 105 09/27/2017 0751   CO2 28 09/27/2017 0751   GLUCOSE 87 09/27/2017 0751   BUN 12 09/27/2017 0751   CREATININE 0.59 09/27/2017 0751   CALCIUM 9.0 09/27/2017 0751   PROT 7.0 09/27/2017  0751   ALBUMIN 4.2 09/27/2017 0751   AST 15 09/27/2017 0751   ALT 15 09/27/2017 0751   ALKPHOS 85 09/27/2017 0751   BILITOT 0.8 09/27/2017 0751   Lab Results  Component Value Date   WBC 5.1 05/07/2014   HGB 13.4 05/07/2014   HCT 40.0 05/07/2014   MCV 94.3 05/07/2014   PLT 250.0 05/07/2014    Imaging Studies: No  results found.  Assessment and Plan:   [name] is a 47 y.o. y/o female here for follow-up of intermittent heartburn, and dysphagia, now resolved with dilation of Schatzki's ring, and EGD also showed Barrett's esophagus from 17 to 19 cm in the proximal esophagus, and a hiatal hernia  Barrett's esophagus in a proximal location Biopsies did not show any dysplasia We will survey this per guidelines, which recommend four-quadrant biopsies every 2 cm.  The index EGD, we obtained multiple biopsies, but they are unlikely to be every 2 cm and four-quadrant entirely.  We will thus plan on repeating EGD within year, likely in 6 months, as recommended per guidelines  In the meantime, will plan on continuing PPI, to control heartburn symptomatically as per guidelines as well. On last visit patient was having intermittent heartburn, this is resolved with once daily PPI At this time, will decrease PPI dosage to 20 mg daily instead of 40 mg daily Benefits of PPI in this patient, with a hiatal hernia, and proximal Barrett's, and a Schatzki's ring, outweigh risks, and this was discussed with her in detail. (Risks of PPI use were discussed with patient including bone loss, C. Diff diarrhea, pneumonia, infections, CKD, electrolyte abnormalities.  If clinically possible based on symptoms, goal would be to maintain patient on the lowest dose possible, or discontinue the medication with institution of acid reflux lifestyle modifications over time. Pt. Verbalizes understanding and chooses to continue the medication.)  If her dysphagia reoccurs, or heartburn reoccurs, she was asked to call us and she verbalized understanding of this  We will also continue to follow-up in clinic  We discussed screening colonoscopy with her again, but she would not like to do this is that time, and would like to start this at 47 years of age.  We discussed American Cancer Society guidelines of starting this at 47 years of age, but  due to other studies recommending 47 years of age, she would like to do this when she is 75.  This is reasonable.  Dr Katherine Steele

## 2018-06-18 NOTE — Patient Instructions (Signed)
F/u 6 months Bed wedge Sending in rx for omeprazole  Barrett Esophagus Barrett esophagus occurs when the tissue that lines the esophagus changes or becomes damaged. The esophagus is the tube that carries food from the throat to the stomach. With Barrett esophagus, the cells that line the esophagus are replaced by cells that are similar to the lining of the intestines (intestinal metaplasia). Barrett esophagus itself may not cause any symptoms. However, many people who have Barrett esophagus also have gastroesophageal reflux disease (GERD), which may cause symptoms such as heartburn. Treatment may include medicines, procedures to destroy the abnormal cells, or surgery. Over time, a few people with this condition may develop cancer of the esophagus. What are the causes? The exact cause of this condition is not known. In some cases, the condition develops from damage to the lining of the esophagus caused by GERD. GERD occurs when stomach acids flow up from the stomach into the esophagus. Frequent symptoms of GERD may cause intestinal metaplasia or cause cell changes (dysplasia). What increases the risk? The following factors may make you more likely to develop this condition:  Having GERD.  Being any of the following: ? Female. ? White (Caucasian). ? Obese. ? Older than 50.  Having a hiatal hernia.  Smoking.  What are the signs or symptoms? People with Barrett esophagus often have no symptoms. However, many people with this condition also have GERD. Symptoms of GERD may include:  Heartburn.  Difficulty swallowing.  Dry cough.  How is this diagnosed? Barrett esophagus may be diagnosed with an exam called an upper gastrointestinal endoscopy. During this exam, a thin, flexible tube (endoscope) is passed down your esophagus. The endoscope has a light and camera on the end of it. Your health care provider uses the endoscope to view the inside of your esophagus. During the exam, several tissue  samples will be removed (biopsy) from your esophagus so they can be checked for intestinal metaplasia or dysplasia. How is this treated? Treatment for this condition may include:  Medicines (proton pump inhibitors, or PPIs) to decrease or stop GERD.  Periodic endoscopic exams to make sure that cancer is not developing.  A procedure or surgery for dysplasia. This may include: ? Endoscopic removal or destruction of abnormal cells. ? Removal of part of the esophagus (esophagectomy).  Follow these instructions at home: Eating and drinking  Eat more fruits and vegetables.  Avoid fatty foods.  Eat small, frequent meals instead of large meals.  Avoid foods that cause heartburn. These foods include: ? Coffee and alcoholic drinks. ? Tomatoes and foods made with tomatoes. ? Greasy or spicy foods. ? Chocolate and peppermint. General instructions   Take over-the-counter and prescription medicines only as told by your health care provider.  Do not use any tobacco products, such as cigarettes, chewing tobacco, and e-cigarettes. If you need help quitting, ask your health care provider.  If your health care provider is treating you for GERD, make sure you follow all instructions and take medicines as directed.  Keep all follow-up visits as told by your health care provider. This is important. Contact a health care provider if:  You have heartburn or GERD symptoms.  You have difficulty swallowing. Get help right away if:  You have chest pain.  You are unable to swallow.  You vomit blood or material that looks like coffee grounds.  Your stool (feces) is bright red or dark. This information is not intended to replace advice given to you by your health  care provider. Make sure you discuss any questions you have with your health care provider. Document Released: 02/23/2004 Document Revised: 05/10/2016 Document Reviewed: 09/15/2015 Elsevier Interactive Patient Education  [employer].

## 2018-07-13 ENCOUNTER — Encounter: Payer: Self-pay | Admitting: Family Medicine

## 2018-10-30 ENCOUNTER — Encounter: Payer: Self-pay | Admitting: Family Medicine

## 2018-10-30 NOTE — Telephone Encounter (Signed)
Per pt, disregard message.

## 2018-10-30 NOTE — Telephone Encounter (Signed)
Noted  

## 2018-11-30 ENCOUNTER — Other Ambulatory Visit: Payer: Self-pay | Admitting: Family Medicine

## 2018-12-04 ENCOUNTER — Other Ambulatory Visit: Payer: Self-pay | Admitting: Family Medicine

## 2018-12-04 DIAGNOSIS — E7800 Pure hypercholesterolemia, unspecified: Secondary | ICD-10-CM

## 2018-12-05 ENCOUNTER — Other Ambulatory Visit (INDEPENDENT_AMBULATORY_CARE_PROVIDER_SITE_OTHER): Payer: BLUE CROSS/BLUE SHIELD

## 2018-12-05 DIAGNOSIS — E7800 Pure hypercholesterolemia, unspecified: Secondary | ICD-10-CM | POA: Diagnosis not present

## 2018-12-05 LAB — COMPREHENSIVE METABOLIC PANEL
ALBUMIN: 4.3 g/dL (ref 3.5–5.2)
ALK PHOS: 99 U/L (ref 39–117)
ALT: 16 U/L (ref 0–35)
AST: 14 U/L (ref 0–37)
BUN: 11 mg/dL (ref 6–23)
CO2: 31 mEq/L (ref 19–32)
CREATININE: 0.62 mg/dL (ref 0.40–1.20)
Calcium: 9.6 mg/dL (ref 8.4–10.5)
Chloride: 103 mEq/L (ref 96–112)
GFR: 108.61 mL/min (ref >60.00)
GLUCOSE: 94 mg/dL (ref 70–99)
POTASSIUM: 4.3 meq/L (ref 3.5–5.1)
SODIUM: 141 meq/L (ref 135–145)
Total Bilirubin: 0.7 mg/dL (ref 0.2–1.2)
Total Protein: 7.3 g/dL (ref 6.0–8.3)

## 2018-12-05 LAB — LIPID PANEL
CHOLESTEROL: 246 mg/dL — AB (ref 0–200)
HDL: 49.2 mg/dL (ref >39.00)
LDL Cholesterol: 168 mg/dL — ABNORMAL HIGH (ref 0–99)
NONHDL: 196.54
Total CHOL/HDL Ratio: 5
Triglycerides: 145 mg/dL (ref 0.0–149.0)
VLDL: 29 mg/dL (ref 0.0–40.0)

## 2018-12-07 ENCOUNTER — Encounter: Payer: Self-pay | Admitting: Family Medicine

## 2018-12-07 NOTE — Assessment & Plan Note (Signed)
Preventative protocols reviewed and updated unless pt declined. Discussed healthy diet and lifestyle.  

## 2018-12-07 NOTE — Progress Notes (Addendum)
BP 122/64 (BP Location: Left Arm, Patient Position: Sitting, Cuff Size: Normal)   Pulse 63   Temp 98.2 F (36.8 C) (Oral)   Ht 5' 7.5" (1.715 m)   Wt 213 lb 4 oz (96.7 kg)   LMP 07/17/2012   SpO2 97%   BMI 32.91 kg/m    CC: CPE Subjective:    Patient ID: Katherine Steele, female    DOB: [birth year], 47 y.o.   MRN: [id number]  HPI: Katherine Steele is a 47 y.o. female presenting on 12/08/2018 for Annual Exam (Pt wants to get Prilosec from Dr. Reece Agar vs GI. )   Mother passed away 08-31-2018 from ovarian cancer. Difficult first holiday season without her. Some marital stress - planning to discuss with husband.   Barrett's esophagus without dysplasia by biopsy on EGD 03/2018 - doing well on omeprazole 20mg  daily. Dysphagia resolved after dilation of schatzki's ring. Recommended rpt EGD in 6-12 months (Dr Allegra Lai). Feels EGD is unaffordable at this time (high deductible plan). Asks if I can take over prilosec for now.   Preventative: COLONOSCOPY Date: 2007 WNL [facility]) - done for abdominal pain after oophorectomy. Start screening at age 35.  Well woman - previously saw OBGYN Dr. Alvie Heidelberg last seen >1 yr ago. S/p total hysterectomy with bilateral oophorectomy for endometriosis with dysmenorrhea (2014).  Mammogram - has not had. No fmhx breast cancer. Decides to postpone mammogram to age 63 - given fmhx ovarian cancer recommended we schedule this year. Flu shot yearly - declines today Tdap 08/2005. Td 2016 Seat belt use discussed. Sunscreen use discussed, no changing moles on skin. Non smoker, husband smokes outdoors  Alcohol - <1 beer/day Dentist q6 mo Eye exam [facility]  Lives with husband [name]) and son [name]) Occupation: Lexicographer Edu: Associate's degree  Activity: exercise 3x/wk (stationary bike)  Diet: good water, fruits/vegetables some  Relevant past medical, surgical, family and social history reviewed and updated as indicated. Interim medical history since our last visit  reviewed. Allergies and medications reviewed and updated. Outpatient Medications Prior to Visit  Medication Sig Dispense Refill  . FLUoxetine (PROZAC) 40 MG capsule TAKE 1 CAPSULE (40 MG TOTAL) BY MOUTH DAILY. 30 capsule 0  . Multiple Vitamin (MULTIVITAMIN) tablet Take 1 tablet by mouth daily.    Marland Kitchen omeprazole (PRILOSEC) 20 MG capsule Take 1 capsule (20 mg total) by mouth daily. 30 capsule 5  . omeprazole (PRILOSEC) 20 MG capsule Take 20 mg by mouth daily.     No facility-administered medications prior to visit.      Per HPI unless specifically indicated in ROS section below Review of Systems  Constitutional: Negative for activity change, appetite change, chills, fatigue, fever and unexpected weight change.  HENT: Positive for congestion (getting over a cold). Negative for hearing loss.   Eyes: Negative for visual disturbance.  Respiratory: Negative for cough, chest tightness, shortness of breath and wheezing.   Cardiovascular: Negative for chest pain, palpitations and leg swelling.  Gastrointestinal: Negative for abdominal distention, abdominal pain, blood in stool, constipation, diarrhea, nausea and vomiting.  Genitourinary: Negative for difficulty urinating and hematuria.  Musculoskeletal: Negative for arthralgias, myalgias and neck pain.  Skin: Negative for rash.  Neurological: Negative for dizziness, seizures, syncope and headaches.  Hematological: Negative for adenopathy. Does not bruise/bleed easily.  Psychiatric/Behavioral: Negative for dysphoric mood. The patient is not nervous/anxious.        Objective:    BP 122/64 (BP Location: Left Arm, Patient Position: Sitting, Cuff Size: Normal)  Pulse 63   Temp 98.2 F (36.8 C) (Oral)   Ht 5' 7.5" (1.715 m)   Wt 213 lb 4 oz (96.7 kg)   LMP 07/17/2012   SpO2 97%   BMI 32.91 kg/m   Wt Readings from Last 3 Encounters:  12/08/18 213 lb 4 oz (96.7 kg)  06/18/18 213 lb 3.2 oz (96.7 kg)  03/21/18 222 lb (100.7 kg)    Physical  Exam Vitals signs and nursing note reviewed.  Constitutional:      General: She is not in acute distress.    Appearance: She is well-developed.  HENT:     Head: Normocephalic and atraumatic.     Right Ear: Hearing, tympanic membrane, ear canal and external ear normal.     Left Ear: Hearing, tympanic membrane, ear canal and external ear normal.     Nose: Nose normal.     Mouth/Throat:     Pharynx: Uvula midline. No oropharyngeal exudate or posterior oropharyngeal erythema.  Eyes:     General: No scleral icterus.    Conjunctiva/sclera: Conjunctivae normal.     Pupils: Pupils are equal, round, and reactive to light.  Neck:     Musculoskeletal: Normal range of motion and neck supple.  Cardiovascular:     Rate and Rhythm: Normal rate and regular rhythm.     Pulses:          Radial pulses are 2+ on the right side and 2+ on the left side.     Heart sounds: Normal heart sounds. No murmur.  Pulmonary:     Effort: Pulmonary effort is normal. No respiratory distress.     Breath sounds: Normal breath sounds. No wheezing or rales.  Abdominal:     General: Bowel sounds are normal. There is no distension.     Palpations: Abdomen is soft. There is no mass.     Tenderness: There is no abdominal tenderness. There is no guarding or rebound.  Musculoskeletal: Normal range of motion.  Lymphadenopathy:     Cervical: No cervical adenopathy.  Skin:    General: Skin is warm and dry.     Findings: No rash.  Neurological:     Mental Status: She is alert and oriented to person, place, and time.     Comments: CN grossly intact, station and gait intact  Psychiatric:        Behavior: Behavior normal.        Thought Content: Thought content normal.        Judgment: Judgment normal.    Results for orders placed or performed in visit on 12/05/18  Comprehensive metabolic panel  Result Value Ref Range   Sodium 141 135 - 145 mEq/L   Potassium 4.3 3.5 - 5.1 mEq/L   Chloride 103 96 - 112 mEq/L   CO2 31  19 - 32 mEq/L   Glucose, Bld 94 70 - 99 mg/dL   BUN 11 6 - 23 mg/dL   Creatinine, Ser 7.37 0.40 - 1.20 mg/dL   Total Bilirubin 0.7 0.2 - 1.2 mg/dL   Alkaline Phosphatase 99 39 - 117 U/L   AST 14 0 - 37 U/L   ALT 16 0 - 35 U/L   Total Protein 7.3 6.0 - 8.3 g/dL   Albumin 4.3 3.5 - 5.2 g/dL   Calcium 9.6 8.4 - 10.6 mg/dL   GFR 269.48 >54.62 mL/min  Lipid panel  Result Value Ref Range   Cholesterol 246 (H) 0 - 200 mg/dL   Triglycerides 703.5 0.0 -  149.0 mg/dL   HDL 45.40 >98.11 mg/dL   VLDL 91.4 0.0 - 78.2 mg/dL   LDL Cholesterol 956 (H) 0 - 99 mg/dL   Total CHOL/HDL Ratio 5    NonHDL 196.54       Assessment & Plan:   Problem List Items Addressed This Visit    Surgical menopause, asymptomatic    S/p hysterectomy and oophorectomy age 12      Schatzki's ring    Dysphagia resolved s/p dilation.       MDD (major depressive disorder)    Chronic, stable on prozac 40mg  daily. Grieving mother's passing this year. Support provided.       HLD (hyperlipidemia)    Chronic, some improvement noted. Encouraged healthy diet. Low 74yr risk.  The 10-year ASCVD risk score 4yr DC Denman George., et al., 2013) is: 1.6%   Values used to calculate the score:     Age: 72 years     Sex: Female     Is Non-Hispanic African American: No     Diabetic: No     Tobacco smoker: No     Systolic Blood Pressure: 122 mmHg     Is BP treated: No     HDL Cholesterol: 49.2 mg/dL     Total Cholesterol: 246 mg/dL       Hiatal hernia   Health maintenance examination - Primary    Preventative protocols reviewed and updated unless pt declined. Discussed healthy diet and lifestyle.       Gastroesophageal reflux disease    Chronic, stable on omeprazole 20mg  daily. Continue.       Barrett's esophagus    Reviewed dx with patient as well as recommended f/u plan - pt feels this is unaffordable at this time. Will continue omeprazole 20mg  indefinitely.        Other Visit Diagnoses    Breast cancer screening         Relevant Orders   MM Digital Screening       No orders of the defined types were placed in this encounter.  Orders Placed This Encounter  Procedures  . MM Digital Screening    Standing Status:   Future    Standing Expiration Date:   02/09/2020    Order Specific Question:   Reason for Exam (SYMPTOM  OR DIAGNOSIS REQUIRED)    Answer:   breast cancer screening    Order Specific Question:   Is the patient pregnant?    Answer:   No    Order Specific Question:   Preferred imaging location?    [facility]     Follow up plan: Return in about 1 year (around 12/09/2019) for annual exam, prior fasting for blood work.  , MD

## 2018-12-08 ENCOUNTER — Ambulatory Visit (INDEPENDENT_AMBULATORY_CARE_PROVIDER_SITE_OTHER): Payer: BLUE CROSS/BLUE SHIELD | Admitting: Family Medicine

## 2018-12-08 ENCOUNTER — Encounter: Payer: Self-pay | Admitting: Family Medicine

## 2018-12-08 VITALS — BP 122/64 | HR 63 | Temp 98.2°F | Ht 67.5 in | Wt 213.2 lb

## 2018-12-08 DIAGNOSIS — K449 Diaphragmatic hernia without obstruction or gangrene: Secondary | ICD-10-CM

## 2018-12-08 DIAGNOSIS — E7800 Pure hypercholesterolemia, unspecified: Secondary | ICD-10-CM

## 2018-12-08 DIAGNOSIS — K222 Esophageal obstruction: Secondary | ICD-10-CM

## 2018-12-08 DIAGNOSIS — F3341 Major depressive disorder, recurrent, in partial remission: Secondary | ICD-10-CM

## 2018-12-08 DIAGNOSIS — K2100 Gastro-esophageal reflux disease with esophagitis, without bleeding: Secondary | ICD-10-CM

## 2018-12-08 DIAGNOSIS — K2270 Barrett's esophagus without dysplasia: Secondary | ICD-10-CM

## 2018-12-08 DIAGNOSIS — Z1239 Encounter for other screening for malignant neoplasm of breast: Secondary | ICD-10-CM

## 2018-12-08 DIAGNOSIS — E8940 Asymptomatic postprocedural ovarian failure: Secondary | ICD-10-CM

## 2018-12-08 DIAGNOSIS — Z0000 Encounter for general adult medical examination without abnormal findings: Secondary | ICD-10-CM | POA: Diagnosis not present

## 2018-12-08 NOTE — Patient Instructions (Addendum)
Let's schedule mammogram at the breast center. Good to see you today. Continue current medicines. Return as needed or in 1 year for next physical.   Health Maintenance, Female Adopting a healthy lifestyle and getting preventive care can go a long way to promote health and wellness. Talk with your health care provider about what schedule of regular examinations is right for you. This is a good chance for you to check in with your provider about disease prevention and staying healthy. In between checkups, there are plenty of things you can do on your own. Experts have done a lot of research about which lifestyle changes and preventive measures are most likely to keep you healthy. Ask your health care provider for more information. Weight and diet Eat a healthy diet  Be sure to include plenty of vegetables, fruits, low-fat dairy products, and lean protein.  Do not eat a lot of foods high in solid fats, added sugars, or salt.  Get regular exercise. This is one of the most important things you can do for your health. ? Most adults should exercise for at least 150 minutes each week. The exercise should increase your heart rate and make you sweat (moderate-intensity exercise). ? Most adults should also do strengthening exercises at least twice a week. This is in addition to the moderate-intensity exercise. Maintain a healthy weight  Body mass index (BMI) is a measurement that can be used to identify possible weight problems. It estimates body fat based on height and weight. Your health care provider can help determine your BMI and help you achieve or maintain a healthy weight.  For females 25 years of age and older: ? A BMI below 18.5 is considered underweight. ? A BMI of 18.5 to 24.9 is normal. ? A BMI of 25 to 29.9 is considered overweight. ? A BMI of 30 and above is considered obese. Watch levels of cholesterol and blood lipids  You should start having your blood tested for lipids and  cholesterol at 49 years of age, then have this test every 5 years.  You may need to have your cholesterol levels checked more often if: ? Your lipid or cholesterol levels are high. ? You are older than 49 years of age. ? You are at high risk for heart disease. Cancer screening Lung Cancer  Lung cancer screening is recommended for adults 23-53 years old who are at high risk for lung cancer because of a history of smoking.  A yearly low-dose CT scan of the lungs is recommended for people who: ? Currently smoke. ? Have quit within the past 15 years. ? Have at least a 30-pack-year history of smoking. A pack year is smoking an average of one pack of cigarettes a day for 1 year.  Yearly screening should continue until it has been 15 years since you quit.  Yearly screening should stop if you develop a health problem that would prevent you from having lung cancer treatment. Breast Cancer  Practice breast self-awareness. This means understanding how your breasts normally appear and feel.  It also means doing regular breast self-exams. Let your health care provider know about any changes, no matter how small.  If you are in your 20s or 30s, you should have a clinical breast exam (CBE) by a health care provider every 1-3 years as part of a regular health exam.  If you are 55 or older, have a CBE every year. Also consider having a breast X-ray (mammogram) every year.  If you  have a family history of breast cancer, talk to your health care provider about genetic screening.  If you are at high risk for breast cancer, talk to your health care provider about having an MRI and a mammogram every year.  Breast cancer gene (BRCA) assessment is recommended for women who have family members with BRCA-related cancers. BRCA-related cancers include: ? Breast. ? Ovarian. ? Tubal. ? Peritoneal cancers.  Results of the assessment will determine the need for genetic counseling and BRCA1 and BRCA2  testing. Cervical Cancer Your health care provider may recommend that you be screened regularly for cancer of the pelvic organs (ovaries, uterus, and vagina). This screening involves a pelvic examination, including checking for microscopic changes to the surface of your cervix (Pap test). You may be encouraged to have this screening done every 3 years, beginning at age 66.  For women ages 55-65, health care providers may recommend pelvic exams and Pap testing every 3 years, or they may recommend the Pap and pelvic exam, combined with testing for human papilloma virus (HPV), every 5 years. Some types of HPV increase your risk of cervical cancer. Testing for HPV may also be done on women of any age with unclear Pap test results.  Other health care providers may not recommend any screening for nonpregnant women who are considered low risk for pelvic cancer and who do not have symptoms. Ask your health care provider if a screening pelvic exam is right for you.  If you have had past treatment for cervical cancer or a condition that could lead to cancer, you need Pap tests and screening for cancer for at least 20 years after your treatment. If Pap tests have been discontinued, your risk factors (such as having a new sexual partner) need to be reassessed to determine if screening should resume. Some women have medical problems that increase the chance of getting cervical cancer. In these cases, your health care provider may recommend more frequent screening and Pap tests. Colorectal Cancer  This type of cancer can be detected and often prevented.  Routine colorectal cancer screening usually begins at 49 years of age and continues through 49 years of age.  Your health care provider may recommend screening at an earlier age if you have risk factors for colon cancer.  Your health care provider may also recommend using home test kits to check for hidden blood in the stool.  A small camera at the end of a  tube can be used to examine your colon directly (sigmoidoscopy or colonoscopy). This is done to check for the earliest forms of colorectal cancer.  Routine screening usually begins at age 46.  Direct examination of the colon should be repeated every 5-10 years through 49 years of age. However, you may need to be screened more often if early forms of precancerous polyps or small growths are found. Skin Cancer  Check your skin from head to toe regularly.  Tell your health care provider about any new moles or changes in moles, especially if there is a change in a mole's shape or color.  Also tell your health care provider if you have a mole that is larger than the size of a pencil eraser.  Always use sunscreen. Apply sunscreen liberally and repeatedly throughout the day.  Protect yourself by wearing long sleeves, pants, a wide-brimmed hat, and sunglasses whenever you are outside. Heart disease, diabetes, and high blood pressure  High blood pressure causes heart disease and increases the risk of stroke. High  blood pressure is more likely to develop in: ? People who have blood pressure in the high end of the normal range (130-139/85-89 mm Hg). ? People who are overweight or obese. ? People who are African American.  If you are 68-63 years of age, have your blood pressure checked every 3-5 years. If you are 90 years of age or older, have your blood pressure checked every year. You should have your blood pressure measured twice-once when you are at a hospital or clinic, and once when you are not at a hospital or clinic. Record the average of the two measurements. To check your blood pressure when you are not at a hospital or clinic, you can use: ? An automated blood pressure machine at a pharmacy. ? A home blood pressure monitor.  If you are between 59 years and 21 years old, ask your health care provider if you should take aspirin to prevent strokes.  Have regular diabetes screenings. This  involves taking a blood sample to check your fasting blood sugar level. ? If you are at a normal weight and have a low risk for diabetes, have this test once every three years after 49 years of age. ? If you are overweight and have a high risk for diabetes, consider being tested at a younger age or more often. Preventing infection Hepatitis B  If you have a higher risk for hepatitis B, you should be screened for this virus. You are considered at high risk for hepatitis B if: ? You were born in a country where hepatitis B is common. Ask your health care provider which countries are considered high risk. ? Your parents were born in a high-risk country, and you have not been immunized against hepatitis B (hepatitis B vaccine). ? You have HIV or AIDS. ? You use needles to inject street drugs. ? You live with someone who has hepatitis B. ? You have had sex with someone who has hepatitis B. ? You get hemodialysis treatment. ? You take certain medicines for conditions, including cancer, organ transplantation, and autoimmune conditions. Hepatitis C  Blood testing is recommended for: ? Everyone born from [birth year] through 1967. ? Anyone with known risk factors for hepatitis C. Sexually transmitted infections (STIs)  You should be screened for sexually transmitted infections (STIs) including gonorrhea and chlamydia if: ? You are sexually active and are younger than 49 years of age. ? You are older than 49 years of age and your health care provider tells you that you are at risk for this type of infection. ? Your sexual activity has changed since you were last screened and you are at an increased risk for chlamydia or gonorrhea. Ask your health care provider if you are at risk.  If you do not have HIV, but are at risk, it may be recommended that you take a prescription medicine daily to prevent HIV infection. This is called pre-exposure prophylaxis (PrEP). You are considered at risk if: ? You are  sexually active and do not regularly use condoms or know the HIV status of your partner(s). ? You take drugs by injection. ? You are sexually active with a partner who has HIV. Talk with your health care provider about whether you are at high risk of being infected with HIV. If you choose to begin PrEP, you should first be tested for HIV. You should then be tested every 3 months for as long as you are taking PrEP. Pregnancy  If you are premenopausal and you  may become pregnant, ask your health care provider about preconception counseling.  If you may become pregnant, take 400 to 800 micrograms (mcg) of folic acid every day.  If you want to prevent pregnancy, talk to your health care provider about birth control (contraception). Osteoporosis and menopause  Osteoporosis is a disease in which the bones lose minerals and strength with aging. This can result in serious bone fractures. Your risk for osteoporosis can be identified using a bone density scan.  If you are 63 years of age or older, or if you are at risk for osteoporosis and fractures, ask your health care provider if you should be screened.  Ask your health care provider whether you should take a calcium or vitamin D supplement to lower your risk for osteoporosis.  Menopause may have certain physical symptoms and risks.  Hormone replacement therapy may reduce some of these symptoms and risks. Talk to your health care provider about whether hormone replacement therapy is right for you. Follow these instructions at home:  Schedule regular health, dental, and eye exams.  Stay current with your immunizations.  Do not use any tobacco products including cigarettes, chewing tobacco, or electronic cigarettes.  If you are pregnant, do not drink alcohol.  If you are breastfeeding, limit how much and how often you drink alcohol.  Limit alcohol intake to no more than 1 drink per day for nonpregnant women. One drink equals 12 ounces of  beer, 5 ounces of wine, or 1 ounces of hard liquor.  Do not use street drugs.  Do not share needles.  Ask your health care provider for help if you need support or information about quitting drugs.  Tell your health care provider if you often feel depressed.  Tell your health care provider if you have ever been abused or do not feel safe at home. This information is not intended to replace advice given to you by your health care provider. Make sure you discuss any questions you have with your health care provider. Document Released: 06/18/2011 Document Revised: 05/10/2016 Document Reviewed: 09/06/2015 Elsevier Interactive Patient Education  2019 [employer].

## 2018-12-08 NOTE — Assessment & Plan Note (Addendum)
Chronic, some improvement noted. Encouraged healthy diet. Low 19yr risk.  The 10-year ASCVD risk score Denman George DC Montez Hageman., et al., 2013) is: 1.6%   Values used to calculate the score:     Age: 47 years     Sex: Female     Is Non-Hispanic African American: No     Diabetic: No     Tobacco smoker: No     Systolic Blood Pressure: 122 mmHg     Is BP treated: No     HDL Cholesterol: 49.2 mg/dL     Total Cholesterol: 246 mg/dL

## 2018-12-09 DIAGNOSIS — E8940 Asymptomatic postprocedural ovarian failure: Secondary | ICD-10-CM | POA: Insufficient documentation

## 2018-12-09 NOTE — Assessment & Plan Note (Signed)
Reviewed dx with patient as well as recommended f/u plan - pt feels this is unaffordable at this time. Will continue omeprazole 20mg  indefinitely.

## 2018-12-09 NOTE — Assessment & Plan Note (Signed)
Dysphagia resolved s/p dilation.

## 2018-12-09 NOTE — Assessment & Plan Note (Signed)
S/p hysterectomy and oophorectomy age 47

## 2018-12-09 NOTE — Assessment & Plan Note (Signed)
Chronic, stable on omeprazole 20mg  daily. Continue.

## 2018-12-09 NOTE — Assessment & Plan Note (Addendum)
Chronic, stable on prozac 40mg  daily. Grieving mother's passing this year. Support provided.

## 2018-12-26 ENCOUNTER — Other Ambulatory Visit: Payer: Self-pay | Admitting: Family Medicine

## 2019-01-30 ENCOUNTER — Other Ambulatory Visit: Payer: Self-pay | Admitting: Gastroenterology

## 2019-02-01 ENCOUNTER — Encounter: Payer: Self-pay | Admitting: Family Medicine

## 2019-02-01 MED ORDER — OMEPRAZOLE 20 MG PO CPDR: 20.0000 mg | DELAYED_RELEASE_CAPSULE | Freq: Every day | ORAL | 5 refills | Status: DC

## 2019-02-26 ENCOUNTER — Encounter: Payer: Self-pay | Admitting: Family Medicine

## 2019-02-27 NOTE — Telephone Encounter (Signed)
Updated pt's pharmacy list per request.

## 2019-03-02 ENCOUNTER — Other Ambulatory Visit: Payer: Self-pay

## 2019-03-02 MED ORDER — FLUOXETINE HCL 40 MG PO CAPS: ORAL_CAPSULE | ORAL | 0 refills | Status: DC

## 2019-03-02 MED ORDER — OMEPRAZOLE 20 MG PO CPDR: 20.0000 mg | DELAYED_RELEASE_CAPSULE | Freq: Every day | ORAL | 1 refills | Status: DC

## 2019-03-02 NOTE — Telephone Encounter (Signed)
Received faxed 90-day rx request from [facility] for fluoxetine and omeprazole.  E-scribed rxs

## 2019-05-08 ENCOUNTER — Other Ambulatory Visit: Payer: Self-pay | Admitting: Family Medicine

## 2019-07-03 ENCOUNTER — Other Ambulatory Visit: Payer: Self-pay | Admitting: Family Medicine

## 2019-07-19 ENCOUNTER — Other Ambulatory Visit: Payer: Self-pay | Admitting: Family Medicine

## 2019-08-11 ENCOUNTER — Encounter: Payer: Self-pay | Admitting: Family Medicine

## 2019-08-13 MED ORDER — FLUOXETINE HCL 20 MG PO TABS: 20.0000 mg | ORAL_TABLET | Freq: Every day | ORAL | 1 refills | Status: DC

## 2019-09-14 ENCOUNTER — Encounter: Payer: Self-pay | Admitting: Family Medicine

## 2019-09-14 DIAGNOSIS — F3341 Major depressive disorder, recurrent, in partial remission: Secondary | ICD-10-CM

## 2019-09-16 ENCOUNTER — Other Ambulatory Visit: Payer: Self-pay

## 2019-09-16 DIAGNOSIS — R6889 Other general symptoms and signs: Secondary | ICD-10-CM | POA: Diagnosis not present

## 2019-09-16 DIAGNOSIS — Z20822 Contact with and (suspected) exposure to covid-19: Secondary | ICD-10-CM

## 2019-09-17 LAB — NOVEL CORONAVIRUS, NAA: SARS-CoV-2, NAA: NOT DETECTED

## 2019-09-22 ENCOUNTER — Encounter (HOSPITAL_COMMUNITY): Payer: Self-pay | Admitting: Licensed Clinical Social Worker

## 2019-09-22 ENCOUNTER — Other Ambulatory Visit: Payer: Self-pay

## 2019-09-22 ENCOUNTER — Ambulatory Visit (INDEPENDENT_AMBULATORY_CARE_PROVIDER_SITE_OTHER): Payer: BC Managed Care – PPO | Admitting: Licensed Clinical Social Worker

## 2019-09-22 DIAGNOSIS — F331 Major depressive disorder, recurrent, moderate: Secondary | ICD-10-CM
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
## 2019-09-30 ENCOUNTER — Encounter: Payer: Self-pay | Admitting: Family Medicine

## 2019-09-30 ENCOUNTER — Ambulatory Visit (HOSPITAL_COMMUNITY): Payer: BC Managed Care – PPO | Admitting: Licensed Clinical Social Worker

## 2019-10-07 ENCOUNTER — Other Ambulatory Visit: Payer: Self-pay | Admitting: Family Medicine

## 2019-11-19 ENCOUNTER — Encounter: Payer: Self-pay | Admitting: Family Medicine

## 2019-12-03 ENCOUNTER — Telehealth: Payer: Self-pay

## 2019-12-03 NOTE — Telephone Encounter (Signed)
LVM w COVID screen, front door and back lab 12.17.2020 TLJ

## 2019-12-06 ENCOUNTER — Other Ambulatory Visit: Payer: Self-pay | Admitting: Family Medicine

## 2019-12-06 DIAGNOSIS — E7800 Pure hypercholesterolemia, unspecified: Secondary | ICD-10-CM

## 2019-12-07 ENCOUNTER — Other Ambulatory Visit: Payer: Self-pay

## 2019-12-07 ENCOUNTER — Other Ambulatory Visit (INDEPENDENT_AMBULATORY_CARE_PROVIDER_SITE_OTHER): Payer: BC Managed Care – PPO

## 2019-12-07 DIAGNOSIS — E7800 Pure hypercholesterolemia, unspecified: Secondary | ICD-10-CM

## 2019-12-07 LAB — COMPREHENSIVE METABOLIC PANEL
ALT: 11 U/L (ref 0–35)
AST: 14 U/L (ref 0–37)
Albumin: 4.3 g/dL (ref 3.5–5.2)
Alkaline Phosphatase: 82 U/L (ref 39–117)
BUN: 11 mg/dL (ref 6–23)
CO2: 28 mEq/L (ref 19–32)
Calcium: 9.6 mg/dL (ref 8.4–10.5)
Chloride: 105 mEq/L (ref 96–112)
Creatinine, Ser: 0.55 mg/dL (ref 0.40–1.20)
GFR: 116.86 mL/min (ref >60.00)
Glucose, Bld: 93 mg/dL (ref 70–99)
Potassium: 4 mEq/L (ref 3.5–5.1)
Sodium: 140 mEq/L (ref 135–145)
Total Bilirubin: 0.5 mg/dL (ref 0.2–1.2)
Total Protein: 7 g/dL (ref 6.0–8.3)

## 2019-12-07 LAB — LIPID PANEL
Cholesterol: 231 mg/dL — ABNORMAL HIGH (ref 0–200)
HDL: 57.7 mg/dL (ref >39.00)
LDL Cholesterol: 157 mg/dL — ABNORMAL HIGH (ref 0–99)
NonHDL: 173.37
Total CHOL/HDL Ratio: 4
Triglycerides: 81 mg/dL (ref 0.0–149.0)
VLDL: 16.2 mg/dL (ref 0.0–40.0)

## 2019-12-07 LAB — TSH: TSH: 2.33 u[IU]/mL (ref 0.35–4.50)

## 2019-12-14 ENCOUNTER — Ambulatory Visit (INDEPENDENT_AMBULATORY_CARE_PROVIDER_SITE_OTHER): Payer: BC Managed Care – PPO | Admitting: Family Medicine

## 2019-12-14 ENCOUNTER — Other Ambulatory Visit: Payer: Self-pay

## 2019-12-14 ENCOUNTER — Encounter: Payer: Self-pay | Admitting: Family Medicine

## 2019-12-14 VITALS — BP 118/68 | HR 57 | Temp 98.0°F | Ht 68.0 in | Wt 218.3 lb

## 2019-12-14 DIAGNOSIS — F3341 Major depressive disorder, recurrent, in partial remission: Secondary | ICD-10-CM

## 2019-12-14 DIAGNOSIS — F411 Generalized anxiety disorder: Secondary | ICD-10-CM

## 2019-12-14 DIAGNOSIS — Z1211 Encounter for screening for malignant neoplasm of colon: Secondary | ICD-10-CM

## 2019-12-14 DIAGNOSIS — K2270 Barrett's esophagus without dysplasia: Secondary | ICD-10-CM

## 2019-12-14 DIAGNOSIS — Z23 Encounter for immunization: Secondary | ICD-10-CM

## 2019-12-14 DIAGNOSIS — K2100 Gastro-esophageal reflux disease with esophagitis, without bleeding: Secondary | ICD-10-CM

## 2019-12-14 DIAGNOSIS — E8940 Asymptomatic postprocedural ovarian failure: Secondary | ICD-10-CM

## 2019-12-14 DIAGNOSIS — Z0000 Encounter for general adult medical examination without abnormal findings: Secondary | ICD-10-CM

## 2019-12-14 DIAGNOSIS — E7800 Pure hypercholesterolemia, unspecified: Secondary | ICD-10-CM

## 2019-12-14 MED ORDER — OMEPRAZOLE 20 MG PO CPDR: 20.0000 mg | DELAYED_RELEASE_CAPSULE | Freq: Every day | ORAL | 3 refills | Status: DC

## 2019-12-14 NOTE — Assessment & Plan Note (Signed)
-  Continue omeprazole 20 mg daily

## 2019-12-14 NOTE — Assessment & Plan Note (Signed)
Chronic, improving. Encouraged healthy diet choices to improve cholesterol levels.  The 10-year ASCVD risk score Mikey Bussing DC Brooke Bonito., et al., 2013) is: 1.2%   Values used to calculate the score:     Age: 48 years     Sex: Female     Is Non-Hispanic African American: No     Diabetic: No     Tobacco smoker: No     Systolic Blood Pressure: 117 mmHg     Is BP treated: No     HDL Cholesterol: 57.7 mg/dL     Total Cholesterol: 231 mg/dL

## 2019-12-14 NOTE — Patient Instructions (Addendum)
Flu shot today Pass by lab to pick up stool kit.  Call to schedule mammogram at your convenience: [place] [phone] Work on healthy diet - low saturated fat, low trans fat, high in fiber and fruits/vegetables for lowering cholesterol levels. Work on hobbies. Work on regular exercise routine.  You are doing well. Return as needed or in 1 year for next physical.   Health Maintenance, Female Adopting a healthy lifestyle and getting preventive care are important in promoting health and wellness. Ask your health care provider about:  The right schedule for you to have regular tests and exams.  Things you can do on your own to prevent diseases and keep yourself healthy. What should I know about diet, weight, and exercise? Eat a healthy diet   Eat a diet that includes plenty of vegetables, fruits, low-fat dairy products, and lean protein.  Do not eat a lot of foods that are high in solid fats, added sugars, or sodium. Maintain a healthy weight Body mass index (BMI) is used to identify weight problems. It estimates body fat based on height and weight. Your health care provider can help determine your BMI and help you achieve or maintain a healthy weight. Get regular exercise Get regular exercise. This is one of the most important things you can do for your health. Most adults should:  Exercise for at least 150 minutes each week. The exercise should increase your heart rate and make you sweat (moderate-intensity exercise).  Do strengthening exercises at least twice a week. This is in addition to the moderate-intensity exercise.  Spend less time sitting. Even light physical activity can be beneficial. Watch cholesterol and blood lipids Have your blood tested for lipids and cholesterol at 50 years of age, then have this test every 5 years. Have your cholesterol levels checked more often if:  Your lipid or cholesterol levels are high.  You are older than 50 years of  age.  You are at high risk for heart disease. What should I know about cancer screening? Depending on your health history and family history, you may need to have cancer screening at various ages. This may include screening for:  Breast cancer.  Cervical cancer.  Colorectal cancer.  Skin cancer.  Lung cancer. What should I know about heart disease, diabetes, and high blood pressure? Blood pressure and heart disease  High blood pressure causes heart disease and increases the risk of stroke. This is more likely to develop in people who have high blood pressure readings, are of African descent, or are overweight.  Have your blood pressure checked: ? Every 3-5 years if you are 78-52 years of age. ? Every year if you are 17 years old or older. Diabetes Have regular diabetes screenings. This checks your fasting blood sugar level. Have the screening done:  Once every three years after age 58 if you are at a normal weight and have a low risk for diabetes.  More often and at a younger age if you are overweight or have a high risk for diabetes. What should I know about preventing infection? Hepatitis B If you have a higher risk for hepatitis B, you should be screened for this virus. Talk with your health care provider to find out if you are at risk for hepatitis B infection. Hepatitis C Testing is recommended for:  Everyone born from [birth year] through 1967.  Anyone with known risk factors for hepatitis C. Sexually transmitted infections (STIs)  Get screened for STIs, including  gonorrhea and chlamydia, if: ? You are sexually active and are younger than 50 years of age. ? You are older than 50 years of age and your health care provider tells you that you are at risk for this type of infection. ? Your sexual activity has changed since you were last screened, and you are at increased risk for chlamydia or gonorrhea. Ask your health care provider if you are at risk.  Ask your health care  provider about whether you are at high risk for HIV. Your health care provider may recommend a prescription medicine to help prevent HIV infection. If you choose to take medicine to prevent HIV, you should first get tested for HIV. You should then be tested every 3 months for as long as you are taking the medicine. Pregnancy  If you are about to stop having your period (premenopausal) and you may become pregnant, seek counseling before you get pregnant.  Take 400 to 800 micrograms (mcg) of folic acid every day if you become pregnant.  Ask for birth control (contraception) if you want to prevent pregnancy. Osteoporosis and menopause Osteoporosis is a disease in which the bones lose minerals and strength with aging. This can result in bone fractures. If you are 11 years old or older, or if you are at risk for osteoporosis and fractures, ask your health care provider if you should:  Be screened for bone loss.  Take a calcium or vitamin D supplement to lower your risk of fractures.  Be given hormone replacement therapy (HRT) to treat symptoms of menopause. Follow these instructions at home: Lifestyle  Do not use any products that contain nicotine or tobacco, such as cigarettes, e-cigarettes, and chewing tobacco. If you need help quitting, ask your health care provider.  Do not use street drugs.  Do not share needles.  Ask your health care provider for help if you need support or information about quitting drugs. Alcohol use  Do not drink alcohol if: ? Your health care provider tells you not to drink. ? You are pregnant, may be pregnant, or are planning to become pregnant.  If you drink alcohol: ? Limit how much you use to 0-1 drink a day. ? Limit intake if you are breastfeeding.  Be aware of how much alcohol is in your drink. In the [place] one drink equals one 12 oz bottle of beer (355 mL), one 5 oz glass of wine (148 mL), or one 1 oz glass of hard liquor (44 mL). General  instructions  Schedule regular health, dental, and eye exams.  Stay current with your vaccines.  Tell your health care provider if: ? You often feel depressed. ? You have ever been abused or do not feel safe at home. Summary  Adopting a healthy lifestyle and getting preventive care are important in promoting health and wellness.  Follow your health care provider's instructions about healthy diet, exercising, and getting tested or screened for diseases.  Follow your health care provider's instructions on monitoring your cholesterol and blood pressure. This information is not intended to replace advice given to you by your health care provider. Make sure you discuss any questions you have with your health care provider. Document Released: 06/18/2011 Document Revised: 11/26/2018 Document Reviewed: 11/26/2018 Elsevier Patient Education  2020 [employer].

## 2019-12-14 NOTE — Assessment & Plan Note (Signed)
Preventative protocols reviewed and updated unless pt declined. Discussed healthy diet and lifestyle.  

## 2019-12-14 NOTE — Assessment & Plan Note (Signed)
Reviewed reasons for routine monitoring with EGD. She declines at this time. Will continue omeprazole 20mg  daily.

## 2019-12-14 NOTE — Assessment & Plan Note (Signed)
Overall stable period off prozac. Feels mood is manageable. Encouraged healthy stress relieving strategies including time for herself, time to develop hobbies she enjoys, encouraged regular exercise routine. She will let me know if worsening symptoms for further management recommendations.

## 2019-12-14 NOTE — Progress Notes (Signed)
This visit was conducted in person.  BP 118/68 (BP Location: Left Arm, Patient Position: Sitting, Cuff Size: Large)   Pulse (!) 57   Temp 98 F (36.7 C) (Temporal)   Ht _0  (1.727 m)   Wt 218 lb 5 oz (99 kg)   LMP 07/17/2012   SpO2 97%   BMI 33.19 kg/m    CC: CPE Subjective:    Patient ID: Katherine Steele, female    DOB: [birth year], 48 y.o.   MRN: [id number]  HPI: Katherine Steele is a 48 y.o. female presenting on 12/14/2019 for Annual Exam   Off prozac over the past 6 months "manageable" off medication. Was having jaw clenching at night time and trouble with sleep due to prozac. Did not tolerate wellbutrin well, did not like zoloft or paxil.   Barrett's esophagus without dysplasia by biopsy on EGD 03/2018 - doing well on omeprazole 21m daily. Dysphagia resolved after dilation of schatzki's ring. Recommended rpt EGD in 6-12 months (Dr VMarius Ditch. EGD was unaffordable last year (high deductible plan). We took over prilosec 2019.   Preventative: COLONOSCOPY Date: 2007 WNL ([facility]- done for abdominal pain after oophorectomy. Discussed options - would like iFOB.  Well woman - previously saw OBGYN Dr. DWaynetta Peanlast seen >2 yrs ago. S/p total hysterectomy withbilateraloophorectomy for endometriosis with dysmenorrhea (2014).  Mammogram - has not had. No fmhx breast cancer. Encouraged she call to schedule appt. Ordered last year.  Flu shot yearly  Tdap 08/2005. Td 2016 shingrix -  Seat belt use discussed. Sunscreen use discussed, no changing moles on skin. Non smoker, husband smokes outdoors  Alcohol -<1 beer/day  Dentist q6 mo  Eye exam QWynelle Bourgeois  Lives with husband (Ronalee Belts and son (Remo Lipps Occupation: eCustomer service manager Edu: Associate's degree  Activity: exercise 3x/wk (stationary bike)  Diet: good water, fruits/vegetables some     Relevant past medical, surgical, family and social history reviewed and updated as indicated. Interim medical history since our last visit  reviewed. Allergies and medications reviewed and updated. Outpatient Medications Prior to Visit  Medication Sig Dispense Refill  . Multiple Vitamin (MULTIVITAMIN) tablet Take 1 tablet by mouth daily.    .Marland Kitchenomeprazole (PRILOSEC) 20 MG capsule TAKE 1 CAPSULE BY MOUTH  DAILY 90 capsule 1  . FLUoxetine (PROZAC) 20 MG tablet Take 1 tablet (20 mg total) by mouth daily. (Patient not taking: Reported on 12/14/2019) 90 tablet 1   No facility-administered medications prior to visit.     Per HPI unless specifically indicated in ROS section below Review of Systems  Constitutional: Negative for activity change, appetite change, chills, fatigue, fever and unexpected weight change.  HENT: Negative for hearing loss.   Eyes: Negative for visual disturbance.  Respiratory: Negative for cough, chest tightness, shortness of breath and wheezing.   Cardiovascular: Negative for chest pain, palpitations and leg swelling.  Gastrointestinal: Positive for abdominal pain. Negative for abdominal distention, blood in stool, constipation (mild), diarrhea, nausea and vomiting.       Improving with probiotic and metamucil PRN  Genitourinary: Negative for difficulty urinating and hematuria.  Musculoskeletal: Negative for arthralgias, myalgias and neck pain.  Skin: Negative for rash.  Neurological: Negative for dizziness, seizures, syncope and headaches.  Hematological: Negative for adenopathy. Does not bruise/bleed easily.  Psychiatric/Behavioral: Positive for dysphoric mood. The patient is not nervous/anxious.    Objective:    BP 118/68 (BP Location: Left Arm, Patient Position: Sitting, Cuff Size: Large)   Pulse (!) 57   Temp  98 F (36.7 C) (Temporal)   Ht _0  (1.727 m)   Wt 218 lb 5 oz (99 kg)   LMP 07/17/2012   SpO2 97%   BMI 33.19 kg/m   Wt Readings from Last 3 Encounters:  12/14/19 218 lb 5 oz (99 kg)  12/08/18 213 lb 4 oz (96.7 kg)  06/18/18 213 lb 3.2 oz (96.7 kg)    Physical Exam Vitals and  nursing note reviewed.  Constitutional:      General: She is not in acute distress.    Appearance: Normal appearance. She is well-developed. She is not ill-appearing.  HENT:     Head: Normocephalic and atraumatic.     Right Ear: Hearing, tympanic membrane, ear canal and external ear normal.     Left Ear: Hearing, tympanic membrane, ear canal and external ear normal.     Nose: Nose normal.     Mouth/Throat:     Pharynx: Uvula midline.  Eyes:     General: No scleral icterus.    Extraocular Movements: Extraocular movements intact.     Conjunctiva/sclera: Conjunctivae normal.     Pupils: Pupils are equal, round, and reactive to light.  Cardiovascular:     Rate and Rhythm: Normal rate and regular rhythm.     Pulses: Normal pulses.          Radial pulses are 2+ on the right side and 2+ on the left side.     Heart sounds: Normal heart sounds. No murmur.  Pulmonary:     Effort: Pulmonary effort is normal. No respiratory distress.     Breath sounds: Normal breath sounds. No wheezing, rhonchi or rales.  Abdominal:     General: Abdomen is flat. Bowel sounds are normal. There is no distension.     Palpations: Abdomen is soft. There is no mass.     Tenderness: There is no abdominal tenderness. There is no guarding or rebound.     Hernia: No hernia is present.  Musculoskeletal:        General: Normal range of motion.     Cervical back: Normal range of motion and neck supple.     Right lower leg: No edema.     Left lower leg: No edema.  Lymphadenopathy:     Cervical: No cervical adenopathy.  Skin:    General: Skin is warm and dry.     Findings: No rash.  Neurological:     General: No focal deficit present.     Mental Status: She is alert and oriented to person, place, and time.     Comments: CN grossly intact, station and gait intact  Psychiatric:        Mood and Affect: Mood normal.        Behavior: Behavior normal.        Thought Content: Thought content normal.        Judgment:  Judgment normal.       Results for orders placed or performed in visit on 12/07/19  TSH  Result Value Ref Range   TSH 2.33 0.35 - 4.50 uIU/mL  Comprehensive metabolic panel  Result Value Ref Range   Sodium 140 135 - 145 mEq/L   Potassium 4.0 3.5 - 5.1 mEq/L   Chloride 105 96 - 112 mEq/L   CO2 28 19 - 32 mEq/L   Glucose, Bld 93 70 - 99 mg/dL   BUN 11 6 - 23 mg/dL   Creatinine, Ser 0.55 0.40 - 1.20 mg/dL   Total Bilirubin  0.5 0.2 - 1.2 mg/dL   Alkaline Phosphatase 82 39 - 117 U/L   AST 14 0 - 37 U/L   ALT 11 0 - 35 U/L   Total Protein 7.0 6.0 - 8.3 g/dL   Albumin 4.3 3.5 - 5.2 g/dL   GFR 116.86 >60.00 mL/min   Calcium 9.6 8.4 - 10.5 mg/dL  Lipid panel  Result Value Ref Range   Cholesterol 231 (H) 0 - 200 mg/dL   Triglycerides 81.0 0.0 - 149.0 mg/dL   HDL 57.70 >39.00 mg/dL   VLDL 16.2 0.0 - 40.0 mg/dL   LDL Cholesterol 157 (H) 0 - 99 mg/dL   Total CHOL/HDL Ratio 4    NonHDL 173.37    Assessment & Plan:  This visit occurred during the SARS-CoV-2 public health emergency.  Safety protocols were in place, including screening questions prior to the visit, additional usage of staff PPE, and extensive cleaning of exam room while observing appropriate contact time as indicated for disinfecting solutions.   Problem List Items Addressed This Visit    Surgical menopause, asymptomatic   MDD (major depressive disorder)    Overall stable period off prozac. Feels mood is manageable. Encouraged healthy stress relieving strategies including time for herself, time to develop hobbies she enjoys, encouraged regular exercise routine. She will let me know if worsening symptoms for further management recommendations.      HLD (hyperlipidemia)    Chronic, improving. Encouraged healthy diet choices to improve cholesterol levels.  The 10-year ASCVD risk score Mikey Bussing DC Brooke Bonito., et al., 2013) is: 1.2%   Values used to calculate the score:     Age: 6 years     Sex: Female     Is Non-Hispanic African  American: No     Diabetic: No     Tobacco smoker: No     Systolic Blood Pressure: 793 mmHg     Is BP treated: No     HDL Cholesterol: 57.7 mg/dL     Total Cholesterol: 231 mg/dL       Health maintenance examination - Primary    Preventative protocols reviewed and updated unless pt declined. Discussed healthy diet and lifestyle.       Generalized anxiety disorder    Stable off medication.       Gastroesophageal reflux disease    Continue omeprazole 63m daily.       Relevant Medications   omeprazole (PRILOSEC) 20 MG capsule   Barrett's esophagus    Reviewed reasons for routine monitoring with EGD. She declines at this time. Will continue omeprazole 278mdaily.        Other Visit Diagnoses    Need for influenza vaccination       Relevant Orders   Flu Vaccine QUAD 36+ mos IM (Completed)   Special screening for malignant neoplasms, colon       Relevant Orders   Fecal occult blood, imunochemical       Meds ordered this encounter  Medications  . omeprazole (PRILOSEC) 20 MG capsule    Sig: Take 1 capsule (20 mg total) by mouth daily.    Dispense:  90 capsule    Refill:  3   Orders Placed This Encounter  Procedures  . Fecal occult blood, imunochemical    Standing Status:   Future    Standing Expiration Date:   12/13/2020  . Flu Vaccine QUAD 36+ mos IM    Patient instructions: Flu shot today Pass by lab to pick up stool kit.  Call  to schedule mammogram at your convenience: [place] [phone] Work on healthy diet - low saturated fat, low trans fat, high in fiber and fruits/vegetables for lowering cholesterol levels. Work on hobbies. Work on regular exercise routine.  You are doing well. Return as needed or in 1 year for next physical.   Follow up plan: Return in about 1 year (around 12/13/2020) for annual exam, prior fasting for blood work.  Ria Bush, MD

## 2019-12-14 NOTE — Assessment & Plan Note (Signed)
Stable off medication.

## 2019-12-15 ENCOUNTER — Other Ambulatory Visit (INDEPENDENT_AMBULATORY_CARE_PROVIDER_SITE_OTHER): Payer: BC Managed Care – PPO

## 2019-12-15 DIAGNOSIS — Z1211 Encounter for screening for malignant neoplasm of colon: Secondary | ICD-10-CM | POA: Diagnosis not present

## 2019-12-16 ENCOUNTER — Encounter: Payer: Self-pay | Admitting: Family Medicine

## 2019-12-16 ENCOUNTER — Other Ambulatory Visit: Payer: Self-pay | Admitting: Family Medicine

## 2019-12-16 ENCOUNTER — Telehealth: Payer: Self-pay

## 2019-12-16 DIAGNOSIS — Z1211 Encounter for screening for malignant neoplasm of colon: Secondary | ICD-10-CM

## 2019-12-16 LAB — FECAL OCCULT BLOOD, IMMUNOCHEMICAL: Fecal Occult Bld: POSITIVE — AB

## 2019-12-16 NOTE — Telephone Encounter (Signed)
[facility] called to report a critical result @ 0845  Positive IFOB

## 2019-12-16 NOTE — Telephone Encounter (Signed)
See result note. Will refer to GI.  

## 2019-12-21 ENCOUNTER — Encounter: Payer: Self-pay | Admitting: Family Medicine

## 2019-12-23 ENCOUNTER — Encounter: Payer: Self-pay | Admitting: Family Medicine

## 2019-12-23 MED ORDER — FLUOXETINE HCL 40 MG PO CAPS: 40.0000 mg | ORAL_CAPSULE | Freq: Every day | ORAL | 3 refills | Status: DC

## 2019-12-23 NOTE — Telephone Encounter (Signed)
See other mychart message.

## 2019-12-30 ENCOUNTER — Encounter: Payer: Self-pay | Admitting: Gastroenterology

## 2019-12-30 ENCOUNTER — Other Ambulatory Visit: Payer: Self-pay

## 2019-12-30 ENCOUNTER — Ambulatory Visit (INDEPENDENT_AMBULATORY_CARE_PROVIDER_SITE_OTHER): Payer: BC Managed Care – PPO | Admitting: Gastroenterology

## 2019-12-30 VITALS — BP 124/77 | HR 53 | Temp 97.9°F | Wt 218.2 lb

## 2019-12-30 DIAGNOSIS — K2270 Barrett's esophagus without dysplasia: Secondary | ICD-10-CM | POA: Diagnosis not present

## 2019-12-30 DIAGNOSIS — R195 Other fecal abnormalities: Secondary | ICD-10-CM

## 2019-12-30 MED ORDER — NA SULFATE-K SULFATE-MG SULF 17.5-3.13-1.6 GM/177ML PO SOLN: 354.0000 mL | Freq: Once | ORAL | 0 refills | Status: AC

## 2019-12-30 NOTE — Progress Notes (Signed)
Katherine Antigua, MD [address]  [place]  [place], [place] [address]  Main: [phone]  Fax: [phone]   Primary Care Physician: Katherine Bush, MD   Chief Complaint  Patient presents with  . Colonoscopy    discuss a colonoscopy     HPI: Katherine Steele is a 49 y.o. female here to discuss FIT positive testing done by primary care provider. The patient denies abdominal or flank pain, anorexia, nausea or vomiting, dysphagia, change in bowel habits or black or bloody stools or weight loss.  Previously seen for intermittent dysphagia and heartburn.  Denies any dysphagia or any breakthrough heartburn with low-dose omeprazole once daily.  Underwent EGD in April 2019 for above symptoms with Schatzki's ring and moderate narrowing noted at the time and dilated to 16.5 mm with TTS balloon dilator.  Has been asymptomatic since then.  EGD also showed circumferential salmon-colored mucosa from 17 to 19 cm in the esophagus at that time.  Hiatal hernia and gastric erythema was also noted.  Biopsy showed mild chronic gastritis.  Esophageal biopsies were negative for EOE.  Salmon-colored mucosa showed focal intestinal metaplasia consistent with Barrett's esophagus  Current Outpatient Medications  Medication Sig Dispense Refill  . FLUoxetine (PROZAC) 40 MG capsule Take 1 capsule (40 mg total) by mouth daily. 90 capsule 3  . Multiple Vitamin (MULTIVITAMIN) tablet Take 1 tablet by mouth daily.    Marland Kitchen omeprazole (PRILOSEC) 20 MG capsule Take 1 capsule (20 mg total) by mouth daily. 90 capsule 3  . Na Sulfate-K Sulfate-Mg Sulf 17.5-3.13-1.6 GM/177ML SOLN Take 354 mLs by mouth once for 1 dose. 354 mL 0   No current facility-administered medications for this visit.    Allergies as of 12/30/2019  . (No Known Allergies)    ROS:  General: Negative for anorexia, weight loss, fever, chills, fatigue, weakness. ENT: Negative for hoarseness, difficulty swallowing , nasal congestion. CV:  Negative for chest pain, angina, palpitations, dyspnea on exertion, peripheral edema.  Respiratory: Negative for dyspnea at rest, dyspnea on exertion, cough, sputum, wheezing.  GI: See history of present illness. GU:  Negative for dysuria, hematuria, urinary incontinence, urinary frequency, nocturnal urination.  Endo: Negative for unusual weight change.    Physical Examination:   BP 124/77 (BP Location: Left Arm, Patient Position: Sitting, Cuff Size: Normal)   Pulse (!) 53   Temp 97.9 F (36.6 C) (Oral)   Wt 218 lb 4 oz (99 kg)   LMP 07/17/2012   BMI 33.18 kg/m   General: Well-nourished, well-developed in no acute distress.  Eyes: No icterus. Conjunctivae pink. Mouth: Oropharyngeal mucosa moist and pink , no lesions erythema or exudate. Neck: Supple, Trachea midline Abdomen: Bowel sounds are normal, nontender, nondistended, no hepatosplenomegaly or masses, no abdominal bruits or hernia , no rebound or guarding.   Extremities: No lower extremity edema. No clubbing or deformities. Neuro: Alert and oriented x 3.  Grossly intact. Skin: Warm and dry, no jaundice.   Psych: Alert and cooperative, normal mood and affect.   Labs: CMP     Component Value Date/Time   NA 140 12/07/2019 0738   K 4.0 12/07/2019 0738   CL 105 12/07/2019 0738   CO2 28 12/07/2019 0738   GLUCOSE 93 12/07/2019 0738   BUN 11 12/07/2019 0738   CREATININE 0.55 12/07/2019 0738   CALCIUM 9.6 12/07/2019 0738   PROT 7.0 12/07/2019 0738   ALBUMIN 4.3 12/07/2019 0738   AST 14 12/07/2019 0738   ALT 11 12/07/2019  0738   ALKPHOS 82 12/07/2019 0738   BILITOT 0.5 12/07/2019 0738   Lab Results  Component Value Date   WBC 5.1 05/07/2014   HGB 13.4 05/07/2014   HCT 40.0 05/07/2014   MCV 94.3 05/07/2014   PLT 250.0 05/07/2014    Imaging Studies: No results found.  Assessment and Plan:   Katherine Steele is a 49 y.o. y/o female recently FOBT positive, with no family history of colon cancer no prior  colonoscopy  Colonoscopy indicated to evaluate for a positive FOBT to rule out malignancy  Focal intestinal metaplasia seen in proximal location in the esophagus, also requires biopsies every 2 cm and every 4 quadrants in that area.  Patient willing to have this done along with her colonoscopy  Reflux symptoms well controlled on low-dose PPI  Benefits of continuing medication, as per Barrett's guidelines, outweigh risks  I have discussed alternative options, risks & benefits,  which include, but are not limited to, bleeding, infection, perforation,respiratory complication & drug reaction.  The patient agrees with this plan & written consent will be obtained.     Dr Melodie Bouillon

## 2019-12-31 ENCOUNTER — Encounter: Payer: Self-pay | Admitting: Anesthesiology

## 2020-01-12 ENCOUNTER — Telehealth: Payer: Self-pay

## 2020-01-12 NOTE — Telephone Encounter (Signed)
Patient is calling wanting to cancel her procedure that is scheduled for 01/15/2020. Patient states it is going to cost her to much money and she can not afford this. Asked patient if she wanted to rescheduled the procedure but patient declined. Called kim in Endo to inform her to cancel the procedure

## 2020-01-13 ENCOUNTER — Other Ambulatory Visit: Admission: RE | Admit: 2020-01-13 | Payer: BC Managed Care – PPO | Source: Ambulatory Visit

## 2020-01-15 ENCOUNTER — Ambulatory Visit
Admission: RE | Admit: 2020-01-15 | Payer: BC Managed Care – PPO | Source: Home / Self Care | Admitting: Gastroenterology

## 2020-01-15 HISTORY — DX: Gastro-esophageal reflux disease without esophagitis: K21.9

## 2020-01-15 HISTORY — DX: Other specified postprocedural states: Z98.890

## 2020-01-15 HISTORY — DX: Nausea with vomiting, unspecified: R11.2

## 2020-01-15 SURGERY — COLONOSCOPY WITH PROPOFOL
Anesthesia: Choice

## 2020-07-19 ENCOUNTER — Encounter: Payer: Self-pay | Admitting: Family Medicine

## 2020-07-19 MED ORDER — ALPRAZOLAM 0.25 MG PO TABS: 0.2500 mg | ORAL_TABLET | Freq: Two times a day (BID) | ORAL | 0 refills | Status: DC | PRN

## 2020-08-12 ENCOUNTER — Other Ambulatory Visit: Payer: Self-pay | Admitting: Family Medicine

## 2020-08-25 ENCOUNTER — Other Ambulatory Visit: Payer: Self-pay

## 2020-08-25 MED ORDER — FLUOXETINE HCL 40 MG PO CAPS: 40.0000 mg | ORAL_CAPSULE | Freq: Every day | ORAL | 1 refills | Status: DC

## 2020-08-25 NOTE — Telephone Encounter (Signed)
Received faxed refill request from [facility] for fluoxetine.  E-scribed refill.

## 2020-11-09 ENCOUNTER — Other Ambulatory Visit: Payer: Self-pay

## 2020-11-09 ENCOUNTER — Other Ambulatory Visit: Payer: BC Managed Care – PPO

## 2020-11-09 DIAGNOSIS — Z20822 Contact with and (suspected) exposure to covid-19: Secondary | ICD-10-CM | POA: Diagnosis not present

## 2020-11-10 LAB — NOVEL CORONAVIRUS, NAA: SARS-CoV-2, NAA: DETECTED — AB

## 2020-11-10 LAB — SARS-COV-2, NAA 2 DAY TAT

## 2020-11-11 ENCOUNTER — Telehealth: Payer: Self-pay | Admitting: Physician Assistant

## 2020-11-11 ENCOUNTER — Encounter: Payer: Self-pay | Admitting: Physician Assistant

## 2020-11-11 DIAGNOSIS — Z6833 Body mass index (BMI) 33.0-33.9, adult: Secondary | ICD-10-CM | POA: Insufficient documentation

## 2020-11-11 DIAGNOSIS — E669 Obesity, unspecified: Secondary | ICD-10-CM | POA: Insufficient documentation

## 2020-11-11 NOTE — Telephone Encounter (Signed)
Called to discuss with patient about Covid symptoms and the use of sotrovimab, bamlanivimab/etesevimab or casirivimab/imdevimab, a monoclonal antibody infusion for those with mild to moderate Covid symptoms and at a high risk of hospitalization.  Pt is qualified for this infusion at the [facility] infusion center due to; Specific high risk criteria : BMI > 25   Message left to call back our hotline [phone]. My chart message sent if active on Mychart.   Nichola Warren PA-C  

## 2020-11-12 ENCOUNTER — Telehealth (HOSPITAL_COMMUNITY): Payer: Self-pay | Admitting: Family

## 2020-11-12 DIAGNOSIS — U071 COVID-19: Secondary | ICD-10-CM

## 2020-11-12 NOTE — Telephone Encounter (Signed)
Called to discuss with Katherine Steele about Covid symptoms and the use of casirivimab/imdevimab, a combination monoclonal antibody infusion for those with mild to moderate Covid symptoms and at a high risk of hospitalization.     Pt is qualified for this infusion at the [facility] infusion center due to co-morbid conditions and/or a member of an at-risk group, however declines infusion at this time as she is feeling better. Symptoms tier reviewed as well as criteria for ending isolation.  Symptoms reviewed that would warrant ED/Hospital evaluation. Preventative practices reviewed. Patient verbalized understanding. Patient advised to call back if he decides that he does want to get infusion. Callback number to the infusion center given. Patient advised to go to Urgent care or ED with severe symptoms.    Patient Active Problem List   Diagnosis Date Noted  . BMI 33.0-33.9,adult   . Surgical menopause, asymptomatic 12/09/2018  . Schatzki's ring   . Barrett's esophagus   . Gastroesophageal reflux disease   . Esophageal dysphagia   . Hiatal hernia   . Cough 02/14/2018  . Low back pain 03/01/2017  . Acquired trigger finger 09/19/2016  . Insomnia, persistent 04/10/2015  . HLD (hyperlipidemia) 08/12/2014  . Health maintenance examination 05/04/2014  . Gender dysphoria in adult 07/17/2012  . Generalized anxiety disorder 02/07/2012  . MDD (major depressive disorder) 02/07/2012    Alfio Loescher,NP

## 2020-11-15 ENCOUNTER — Other Ambulatory Visit: Payer: Self-pay

## 2020-11-15 ENCOUNTER — Other Ambulatory Visit: Payer: BC Managed Care – PPO

## 2020-11-15 DIAGNOSIS — Z20822 Contact with and (suspected) exposure to covid-19: Secondary | ICD-10-CM | POA: Diagnosis not present

## 2020-11-16 LAB — SPECIMEN STATUS REPORT

## 2020-11-16 LAB — NOVEL CORONAVIRUS, NAA: SARS-CoV-2, NAA: NOT DETECTED

## 2020-11-16 LAB — SARS-COV-2, NAA 2 DAY TAT

## 2020-11-18 ENCOUNTER — Other Ambulatory Visit: Payer: Self-pay | Admitting: Family Medicine

## 2020-12-04 ENCOUNTER — Other Ambulatory Visit: Payer: Self-pay | Admitting: Family Medicine

## 2020-12-11 ENCOUNTER — Other Ambulatory Visit: Payer: Self-pay | Admitting: Family Medicine

## 2020-12-11 DIAGNOSIS — E7800 Pure hypercholesterolemia, unspecified: Secondary | ICD-10-CM

## 2020-12-11 DIAGNOSIS — Z1159 Encounter for screening for other viral diseases: Secondary | ICD-10-CM

## 2020-12-14 ENCOUNTER — Other Ambulatory Visit: Payer: Self-pay

## 2020-12-14 ENCOUNTER — Other Ambulatory Visit (INDEPENDENT_AMBULATORY_CARE_PROVIDER_SITE_OTHER): Payer: BC Managed Care – PPO

## 2020-12-14 DIAGNOSIS — E7800 Pure hypercholesterolemia, unspecified: Secondary | ICD-10-CM | POA: Diagnosis not present

## 2020-12-14 DIAGNOSIS — Z1159 Encounter for screening for other viral diseases: Secondary | ICD-10-CM

## 2020-12-14 LAB — COMPREHENSIVE METABOLIC PANEL
ALT: 19 U/L (ref 0–35)
AST: 18 U/L (ref 0–37)
Albumin: 4.4 g/dL (ref 3.5–5.2)
Alkaline Phosphatase: 94 U/L (ref 39–117)
BUN: 14 mg/dL (ref 6–23)
CO2: 29 mEq/L (ref 19–32)
Calcium: 9.1 mg/dL (ref 8.4–10.5)
Chloride: 102 mEq/L (ref 96–112)
Creatinine, Ser: 0.62 mg/dL (ref 0.40–1.20)
GFR: 103.19 mL/min (ref >60.00)
Glucose, Bld: 92 mg/dL (ref 70–99)
Potassium: 4 mEq/L (ref 3.5–5.1)
Sodium: 139 mEq/L (ref 135–145)
Total Bilirubin: 0.9 mg/dL (ref 0.2–1.2)
Total Protein: 6.8 g/dL (ref 6.0–8.3)

## 2020-12-14 LAB — LIPID PANEL
Cholesterol: 283 mg/dL — ABNORMAL HIGH (ref 0–200)
HDL: 58.6 mg/dL (ref >39.00)
LDL Cholesterol: 197 mg/dL — ABNORMAL HIGH (ref 0–99)
NonHDL: 224.33
Total CHOL/HDL Ratio: 5
Triglycerides: 135 mg/dL (ref 0.0–149.0)
VLDL: 27 mg/dL (ref 0.0–40.0)

## 2020-12-15 LAB — HEPATITIS C ANTIBODY
Hepatitis C Ab: NONREACTIVE
SIGNAL TO CUT-OFF: 0.01 (ref <1.00)

## 2020-12-26 ENCOUNTER — Encounter: Payer: Self-pay | Admitting: Family Medicine

## 2020-12-26 ENCOUNTER — Other Ambulatory Visit: Payer: Self-pay

## 2020-12-26 ENCOUNTER — Ambulatory Visit (INDEPENDENT_AMBULATORY_CARE_PROVIDER_SITE_OTHER): Payer: BC Managed Care – PPO | Admitting: Family Medicine

## 2020-12-26 VITALS — BP 128/72 | HR 63 | Temp 96.4°F | Ht 68.0 in | Wt 222.5 lb

## 2020-12-26 DIAGNOSIS — K449 Diaphragmatic hernia without obstruction or gangrene: Secondary | ICD-10-CM

## 2020-12-26 DIAGNOSIS — K2270 Barrett's esophagus without dysplasia: Secondary | ICD-10-CM

## 2020-12-26 DIAGNOSIS — Z1211 Encounter for screening for malignant neoplasm of colon: Secondary | ICD-10-CM

## 2020-12-26 DIAGNOSIS — F411 Generalized anxiety disorder: Secondary | ICD-10-CM | POA: Diagnosis not present

## 2020-12-26 DIAGNOSIS — Z6833 Body mass index (BMI) 33.0-33.9, adult: Secondary | ICD-10-CM

## 2020-12-26 DIAGNOSIS — Z23 Encounter for immunization: Secondary | ICD-10-CM | POA: Diagnosis not present

## 2020-12-26 DIAGNOSIS — F3341 Major depressive disorder, recurrent, in partial remission: Secondary | ICD-10-CM

## 2020-12-26 DIAGNOSIS — E7800 Pure hypercholesterolemia, unspecified: Secondary | ICD-10-CM

## 2020-12-26 DIAGNOSIS — K2100 Gastro-esophageal reflux disease with esophagitis, without bleeding: Secondary | ICD-10-CM

## 2020-12-26 DIAGNOSIS — Z0000 Encounter for general adult medical examination without abnormal findings: Secondary | ICD-10-CM

## 2020-12-26 MED ORDER — ATORVASTATIN CALCIUM 20 MG PO TABS: 20.0000 mg | ORAL_TABLET | Freq: Every day | ORAL | 3 refills | Status: DC

## 2020-12-26 NOTE — Assessment & Plan Note (Signed)
Stable period on omeprazole 20mg daily.  

## 2020-12-26 NOTE — Assessment & Plan Note (Signed)
Encouraged healthy diet and lifestyle choices to affect sustainable weight loss.  ?

## 2020-12-26 NOTE — Patient Instructions (Addendum)
Flu shot today  I do recommend referral for colonoscopy - call to check on pricing with insurance.  Call to schedule mammogram at your convenience: [facility] at [facility] [phone] Cholesterol levels were very high! Work on low cholesterol diet, start atorvastatin 20mg  daily sent to [facility]. Return in 6 months for lab visit only to recheck cholesterol. Let know if any trouble tolerating medicine. Watch for muscle aches.  Return as needed.  Health Maintenance for Postmenopausal Women Menopause is a normal process in which your ability to get pregnant comes to an end. This process happens slowly over many months or years, usually between the ages of 7 and 73. Menopause is complete when you have missed your menstrual periods for 12 months. It is important to talk with your health care provider about some of the most common conditions that affect women after menopause (postmenopausal women). These include heart disease, cancer, and bone loss (osteoporosis). Adopting a healthy lifestyle and getting preventive care can help to promote your health and wellness. The actions you take can also lower your chances of developing some of these common conditions. What should I know about menopause? During menopause, you may get a number of symptoms, such as:  Hot flashes. These can be moderate or severe.  Night sweats.  Decrease in sex drive.  Mood swings.  Headaches.  Tiredness.  Irritability.  Memory problems.  Insomnia. Choosing to treat or not to treat these symptoms is a decision that you make with your health care provider. Do I need hormone replacement therapy?  Hormone replacement therapy is effective in treating symptoms that are caused by menopause, such as hot flashes and night sweats.  Hormone replacement carries certain risks, especially as you become older. If you are thinking about using estrogen or estrogen with progestin, discuss the benefits and risks with your  health care provider. What is my risk for heart disease and stroke? The risk of heart disease, heart attack, and stroke increases as you age. One of the causes may be a change in the body's hormones during menopause. This can affect how your body uses dietary fats, triglycerides, and cholesterol. Heart attack and stroke are medical emergencies. There are many things that you can do to help prevent heart disease and stroke. Watch your blood pressure  High blood pressure causes heart disease and increases the risk of stroke. This is more likely to develop in people who have high blood pressure readings, are of African descent, or are overweight.  Have your blood pressure checked: ? Every 3-5 years if you are 38-26 years of age. ? Every year if you are 38 years old or older. Eat a healthy diet  Eat a diet that includes plenty of vegetables, fruits, low-fat dairy products, and lean protein.  Do not eat a lot of foods that are high in solid fats, added sugars, or sodium.   Get regular exercise Get regular exercise. This is one of the most important things you can do for your health. Most adults should:  Try to exercise for at least 150 minutes each week. The exercise should increase your heart rate and make you sweat (moderate-intensity exercise).  Try to do strengthening exercises at least twice each week. Do these in addition to the moderate-intensity exercise.  Spend less time sitting. Even light physical activity can be beneficial. Other tips  Work with your health care provider to achieve or maintain a healthy weight.  Do not use any products that contain  nicotine or tobacco, such as cigarettes, e-cigarettes, and chewing tobacco. If you need help quitting, ask your health care provider.  Know your numbers. Ask your health care provider to check your cholesterol and your blood sugar (glucose). Continue to have your blood tested as directed by your health care provider. Do I need  screening for cancer? Depending on your health history and family history, you may need to have cancer screening at different stages of your life. This may include screening for:  Breast cancer.  Cervical cancer.  Lung cancer.  Colorectal cancer. What is my risk for osteoporosis? After menopause, you may be at increased risk for osteoporosis. Osteoporosis is a condition in which bone destruction happens more quickly than new bone creation. To help prevent osteoporosis or the bone fractures that can happen because of osteoporosis, you may take the following actions:  If you are 97-43 years old, get at least 1,000 mg of calcium and at least 600 mg of vitamin D per day.  If you are older than age 77 but younger than age 59, get at least 1,200 mg of calcium and at least 600 mg of vitamin D per day.  If you are older than age 21, get at least 1,200 mg of calcium and at least 800 mg of vitamin D per day. Smoking and drinking excessive alcohol increase the risk of osteoporosis. Eat foods that are rich in calcium and vitamin D, and do weight-bearing exercises several times each week as directed by your health care provider. How does menopause affect my mental health? Depression may occur at any age, but it is more common as you become older. Common symptoms of depression include:  Low or sad mood.  Changes in sleep patterns.  Changes in appetite or eating patterns.  Feeling an overall lack of motivation or enjoyment of activities that you previously enjoyed.  Frequent crying spells. Talk with your health care provider if you think that you are experiencing depression. General instructions See your health care provider for regular wellness exams and vaccines. This may include:  Scheduling regular health, dental, and eye exams.  Getting and maintaining your vaccines. These include: ? Influenza vaccine. Get this vaccine each year before the flu season begins. ? Pneumonia  vaccine. ? Shingles vaccine. ? Tetanus, diphtheria, and pertussis (Tdap) booster vaccine. Your health care provider may also recommend other immunizations. Tell your health care provider if you have ever been abused or do not feel safe at home. Summary  Menopause is a normal process in which your ability to get pregnant comes to an end.  This condition causes hot flashes, night sweats, decreased interest in sex, mood swings, headaches, or lack of sleep.  Treatment for this condition may include hormone replacement therapy.  Take actions to keep yourself healthy, including exercising regularly, eating a healthy diet, watching your weight, and checking your blood pressure and blood sugar levels.  Get screened for cancer and depression. Make sure that you are up to date with all your vaccines. This information is not intended to replace advice given to you by your health care provider. Make sure you discuss any questions you have with your health care provider. Document Revised: 11/26/2018 Document Reviewed: 11/26/2018 Elsevier Patient Education  2021 [employer].

## 2020-12-26 NOTE — Assessment & Plan Note (Signed)
Encouraged return to GI.

## 2020-12-26 NOTE — Assessment & Plan Note (Addendum)
Preventative protocols reviewed and updated unless pt declined. Discussed healthy diet and lifestyle.  Encouraged she call to schedule mammogram - she states it was not covered last year. Advised this preventative measure should be covered by insurance.  Reviewed previous iFOB results. She canceled colonoscopy last year due to cost. Recommend return to GI, check with insurance on cost of colonoscopy, new referral placed.

## 2020-12-26 NOTE — Assessment & Plan Note (Signed)
Chronic, deteriorated with LDL >190.  Reviewed indication for statin as well as benefits and possible side effects. Start atorvastatin 20mg  daily, with indication to taper if trouble tolerating.  The 10-year ASCVD risk score DC Denman George., et al., 2013) is: 2%   Values used to calculate the score:     Age: 50 years     Sex: Female     Is Non-Hispanic African American: No     Diabetic: No     Tobacco smoker: No     Systolic Blood Pressure: 128 mmHg     Is BP treated: No     HDL Cholesterol: 58.6 mg/dL     Total Cholesterol: 283 mg/dL

## 2020-12-26 NOTE — Progress Notes (Signed)
Patient ID: Katherine Steele, female    DOB: September 01, 1971, 50 y.o.   MRN: [id number]  This visit was conducted in person.  BP 128/72 (Cuff Size: Large)   Pulse 63   Temp (!) 96.4 F (35.8 C) (Temporal)   Ht  (1.727 m)   Wt 222 lb 8 oz (100.9 kg)   LMP 07/17/2012   SpO2 98%   BMI 33.83 kg/m   BP Readings from Last 3 Encounters:  12/26/20 128/72  12/30/19 124/77  12/14/19 118/68    CC: CPE Subjective:   HPI: Katherine Steele is a 50 y.o. female presenting on 12/26/2020 for Annual Exam   Restarted prozac after FIL passed away last year. Tolerating better - no more jaw clenching at night time or trouble with sleep (previously attributed to this med). Did not tolerate wellbutrin well, did not like zoloft or paxil.   COVID infection 10/2020 - fully recovered.   Barrett's esophagus without dysplasia by biopsy on EGD 03/2018 -doing wellon omeprazole  daily. Dysphagia resolved after dilation of schatzki's ring. Overdue for rpt.   Preventative: COLONOSCOPY Date: 2007 WNL [facility])- done for abdominal pain after oophorectomy. iFOB - positive 2020. Recommend return to GI - order placed.  Well woman -previously sawOBGYN Dr. Alvie Heidelberg last seen >2 yrs ago. S/p total hysterectomy withbilateraloophorectomy for endometriosis with dysmenorrhea(2014).  Mammogram - No fmhx breast cancer. states insurance didn't cover. Advised call to schedule. Lung cancer screening - not eligible Flu shot yearly  COVID vaccine Moderna 02/2020, 03/2020, considering booster after COVID illness Tdap 08/2005. Td 2016 shingrix - discussed, deferred Seat belt use discussed.  Sunscreen use discussed, no changing moleson skin.  Non smoker, husband smokes outdoors  Alcohol - rare Dentist q6 mo  Eye exam yearly - may have worsening cataract   Lives with husband [name]) and son [name])  Occupation: Lexicographer  Edu: Associate's degree  Activity: exercise 3x/wk (walking)  Diet: good  water, fruits/vegetables some      Relevant past medical, surgical, family and social history reviewed and updated as indicated. Interim medical history since our last visit reviewed. Allergies and medications reviewed and updated. Outpatient Medications Prior to Visit  Medication Sig Dispense Refill  . FLUoxetine (PROZAC) 40 MG capsule Take 1 capsule (40 mg total) by mouth daily. 90 capsule 1  . Multiple Vitamin (MULTIVITAMIN) tablet Take 1 tablet by mouth daily.    Marland Kitchen omeprazole (PRILOSEC) 20 MG capsule TAKE 1 CAPSULE BY MOUTH  DAILY 90 capsule 0  . ALPRAZolam (XANAX) 0.25 MG tablet Take 1 tablet (0.25 mg total) by mouth 2 (two) times daily as needed for anxiety. 20 tablet 0   No facility-administered medications prior to visit.     Per HPI unless specifically indicated in ROS section below Review of Systems  Constitutional: Negative for activity change, appetite change, chills, fatigue, fever and unexpected weight change.  HENT: Negative for hearing loss.   Eyes: Negative for visual disturbance.  Respiratory: Negative for cough, chest tightness, shortness of breath and wheezing.   Cardiovascular: Negative for chest pain, palpitations and leg swelling.  Gastrointestinal: Negative for abdominal distention, abdominal pain, blood in stool, constipation, diarrhea, nausea and vomiting.  Genitourinary: Negative for difficulty urinating and hematuria.  Musculoskeletal: Negative for arthralgias, myalgias and neck pain.  Skin: Negative for rash.  Neurological: Negative for dizziness, seizures, syncope and headaches.  Hematological: Negative for adenopathy. Does not bruise/bleed easily.  Psychiatric/Behavioral: Negative for dysphoric mood. The patient is not nervous/anxious.  Objective:  BP 128/72 (Cuff Size: Large)   Pulse 63   Temp (!) 96.4 F (35.8 C) (Temporal)   Ht  (1.727 m)   Wt 222 lb 8 oz (100.9 kg)   LMP 07/17/2012   SpO2 98%   BMI 33.83 kg/m   Wt Readings from Last 3  Encounters:  12/26/20 222 lb 8 oz (100.9 kg)  12/30/19 218 lb 4 oz (99 kg)  12/14/19 218 lb 5 oz (99 kg)      Physical Exam Vitals and nursing note reviewed.  Constitutional:      General: She is not in acute distress.    Appearance: Normal appearance. She is well-developed and well-nourished. She is not ill-appearing.  HENT:     Head: Normocephalic and atraumatic.     Right Ear: Hearing, tympanic membrane, ear canal and external ear normal.     Left Ear: Hearing, tympanic membrane, ear canal and external ear normal.     Mouth/Throat:     Mouth: Oropharynx is clear and moist and mucous membranes are normal.     Pharynx: Uvula midline. No posterior oropharyngeal edema.  Eyes:     General: No scleral icterus.    Extraocular Movements: Extraocular movements intact and EOM normal.     Conjunctiva/sclera: Conjunctivae normal.     Pupils: Pupils are equal, round, and reactive to light.  Cardiovascular:     Rate and Rhythm: Normal rate and regular rhythm.     Pulses: Normal pulses and intact distal pulses.          Radial pulses are 2+ on the right side and 2+ on the left side.     Heart sounds: Normal heart sounds. No murmur heard.   Pulmonary:     Effort: Pulmonary effort is normal. No respiratory distress.     Breath sounds: Normal breath sounds. No wheezing, rhonchi or rales.  Abdominal:     General: Abdomen is flat. Bowel sounds are normal. There is no distension.     Palpations: Abdomen is soft. There is no mass.     Tenderness: There is no abdominal tenderness. There is no guarding or rebound.     Hernia: No hernia is present.  Musculoskeletal:        General: No edema. Normal range of motion.     Cervical back: Normal range of motion and neck supple.  Lymphadenopathy:     Cervical: No cervical adenopathy.  Skin:    General: Skin is warm and dry.     Findings: No rash.  Neurological:     Mental Status: She is alert and oriented to person, place, and time.     Comments:  CN grossly intact, station and gait intact  Psychiatric:        Mood and Affect: Mood and affect normal.        Behavior: Behavior normal.        Thought Content: Thought content normal.        Judgment: Judgment normal.       Results for orders placed or performed in visit on 12/14/20  Hepatitis C antibody  Result Value Ref Range   Hepatitis C Ab NON-REACTIVE NON-REACTI   SIGNAL TO CUT-OFF 0.01 <1.00  Comprehensive metabolic panel  Result Value Ref Range   Sodium 139 135 - 145 mEq/L   Potassium 4.0 3.5 - 5.1 mEq/L   Chloride 102 96 - 112 mEq/L   CO2 29 19 - 32 mEq/L   Glucose, Bld 92  70 - 99 mg/dL   BUN 14 6 - 23 mg/dL   Creatinine, Ser 6.96 0.40 - 1.20 mg/dL   Total Bilirubin 0.9 0.2 - 1.2 mg/dL   Alkaline Phosphatase 94 39 - 117 U/L   AST 18 0 - 37 U/L   ALT 19 0 - 35 U/L   Total Protein 6.8 6.0 - 8.3 g/dL   Albumin 4.4 3.5 - 5.2 g/dL   GFR 295.28 >41.32 mL/min   Calcium 9.1 8.4 - 10.5 mg/dL  Lipid panel  Result Value Ref Range   Cholesterol 283 (H) 0 - 200 mg/dL   Triglycerides 440.1 0.0 - 149.0 mg/dL   HDL 02.72 >53.66 mg/dL   VLDL 44.0 0.0 - 34.7 mg/dL   LDL Cholesterol 425 (H) 0 - 99 mg/dL   Total CHOL/HDL Ratio 5    NonHDL 224.33    Assessment & Plan:  This visit occurred during the SARS-CoV-2 public health emergency.  Safety protocols were in place, including screening questions prior to the visit, additional usage of staff PPE, and extensive cleaning of exam room while observing appropriate contact time as indicated for disinfecting solutions.   Problem List Items Addressed This Visit    MDD (major depressive disorder)    Stable period on prozac.       HLD (hyperlipidemia)    Chronic, deteriorated with LDL >190.  Reviewed indication for statin as well as benefits and possible side effects. Start atorvastatin 20mg  daily, with indication to taper if trouble tolerating.  The 10-year ASCVD risk score DC Denman George., et al., 2013) is: 2%   Values used to  calculate the score:     Age: 78 years     Sex: Female     Is Non-Hispanic African American: No     Diabetic: No     Tobacco smoker: No     Systolic Blood Pressure: 128 mmHg     Is BP treated: No     HDL Cholesterol: 58.6 mg/dL     Total Cholesterol: 283 mg/dL       Relevant Medications   atorvastatin (LIPITOR) 20 MG tablet   Hiatal hernia   Health maintenance examination - Primary    Preventative protocols reviewed and updated unless pt declined. Discussed healthy diet and lifestyle.  Encouraged she call to schedule mammogram - she states it was not covered last year. Advised this preventative measure should be covered by insurance.  Reviewed previous iFOB results. She canceled colonoscopy last year due to cost. Recommend return to GI, check with insurance on cost of colonoscopy, new referral placed.       Generalized anxiety disorder    Stable period on prozac.       Gastroesophageal reflux disease    Stable period on omeprazole 20mg  daily.       BMI 33.0-33.9,adult    Encouraged healthy diet and lifestyle choices to affect sustainable weight loss.       Barrett's esophagus    Encouraged return to GI.       Relevant Orders   Ambulatory referral to Gastroenterology    Other Visit Diagnoses    Special screening for malignant neoplasms, colon       Relevant Orders   Ambulatory referral to Gastroenterology   Need for immunization against influenza       Relevant Orders   Flu Vaccine QUAD 36+ mos IM (Completed)       Meds ordered this encounter  Medications  . atorvastatin (LIPITOR) 20 MG tablet  Sig: Take 1 tablet (20 mg total) by mouth daily.    Dispense:  90 tablet    Refill:  3   Orders Placed This Encounter  Procedures  . Flu Vaccine QUAD 36+ mos IM  . Ambulatory referral to Gastroenterology    Referral Priority:   Routine    Referral Type:   Consultation    Referral Reason:   Specialty Services Required    Number of Visits Requested:   1     Patient instructions: Flu shot today  I do recommend referral for colonoscopy - call to check on pricing with insurance.  Call to schedule mammogram at your convenience: [facility] at [facility] [phone] Cholesterol levels were very high! Work on low cholesterol diet, start atorvastatin 20mg  daily sent to [facility]. Return in 6 months for lab visit only to recheck cholesterol. Let know if any trouble tolerating medicine. Watch for muscle aches.  Return as needed.  Follow up plan: Return in about 1 year (around 12/26/2021) for annual exam, prior fasting for blood work.  02/23/2022, MD

## 2020-12-26 NOTE — Assessment & Plan Note (Signed)
Stable period on prozac.  

## 2021-01-19 ENCOUNTER — Other Ambulatory Visit: Payer: Self-pay | Admitting: Family Medicine

## 2021-01-31 ENCOUNTER — Other Ambulatory Visit: Payer: Self-pay | Admitting: Family Medicine

## 2021-02-02 ENCOUNTER — Encounter: Payer: Self-pay | Admitting: Family Medicine

## 2021-02-02 DIAGNOSIS — E7800 Pure hypercholesterolemia, unspecified: Secondary | ICD-10-CM

## 2021-02-03 MED ORDER — ROSUVASTATIN CALCIUM 5 MG PO TABS: 5.0000 mg | ORAL_TABLET | Freq: Every day | ORAL | 11 refills | Status: DC

## 2021-02-09 MED ORDER — ROSUVASTATIN CALCIUM 5 MG PO TABS: 5.0000 mg | ORAL_TABLET | Freq: Every day | ORAL | 3 refills | Status: DC

## 2021-02-09 NOTE — Addendum Note (Signed)
Addended by: Nanci Pina on: 02/09/2021 02:43 PM   Modules accepted: Orders

## 2021-02-09 NOTE — Telephone Encounter (Signed)
E-scribed rx to [facility], per pt request.

## 2021-02-14 ENCOUNTER — Other Ambulatory Visit: Payer: Self-pay

## 2021-02-14 ENCOUNTER — Encounter: Payer: Self-pay | Admitting: Gastroenterology

## 2021-02-14 ENCOUNTER — Ambulatory Visit (INDEPENDENT_AMBULATORY_CARE_PROVIDER_SITE_OTHER): Payer: BC Managed Care – PPO | Admitting: Gastroenterology

## 2021-02-14 VITALS — BP 113/77 | HR 52 | Temp 98.2°F | Ht 68.5 in | Wt 218.4 lb

## 2021-02-14 DIAGNOSIS — K2270 Barrett's esophagus without dysplasia: Secondary | ICD-10-CM

## 2021-02-14 DIAGNOSIS — Z1211 Encounter for screening for malignant neoplasm of colon: Secondary | ICD-10-CM

## 2021-02-14 DIAGNOSIS — F331 Major depressive disorder, recurrent, moderate: Secondary | ICD-10-CM | POA: Insufficient documentation

## 2021-02-14 DIAGNOSIS — F988 Other specified behavioral and emotional disorders with onset usually occurring in childhood and adolescence: Secondary | ICD-10-CM | POA: Insufficient documentation

## 2021-02-14 DIAGNOSIS — K625 Hemorrhage of anus and rectum: Secondary | ICD-10-CM | POA: Diagnosis not present

## 2021-02-14 DIAGNOSIS — K219 Gastro-esophageal reflux disease without esophagitis: Secondary | ICD-10-CM

## 2021-02-14 MED ORDER — NA SULFATE-K SULFATE-MG SULF 17.5-3.13-1.6 GM/177ML PO SOLN: ORAL | 0 refills | Status: DC

## 2021-02-14 NOTE — Patient Instructions (Addendum)
Please call your insurance company and ask how much it would cost you to have the following procedures: Endoscopy: CPT 43235  Diagnosis: Barrett's esophagus without dysplasia K22.70 and GERD: K21.9  Colonoscopy: CPT 45378  Diagnosis: colonoscopy screening Z12.11

## 2021-02-15 NOTE — Progress Notes (Signed)
Katherine Bouillon, MD [address]  [address]  [place], [place] [address]  Main: [phone]  Fax: [phone]   Primary Care Physician: Katherine Boyden, MD   Chief Complaint  Patient presents with  . Gastroesophageal Reflux    HPI: Katherine Steele is a 50 y.o. female referred for GERD.  Patient was seen in January 2021 due to FIT positive test at the time and previously seen salmon-colored mucosa in the proximal esophagus with biopsies showing focal intestinal metaplasia.  EGD and colonoscopy were recommended at that time but patient never had it done.  Patient states she chose not to have it done due to cost at that time.  Patient has not had any repeat FOBT/FIT testing or any other stool testing for [facility] since December 2020.  Patient is reporting intermittent bright blood per rectum that occurs about once a month.  Reports 1 soft bowel movement daily.  No weight loss.  No nausea or vomiting.  Has increased her omeprazole to 40 mg once daily recently due to return of heartburn on 20 mg once daily dosing.  Current Outpatient Medications  Medication Sig Dispense Refill  . FLUoxetine (PROZAC) 40 MG capsule TAKE 1 CAPSULE BY MOUTH  DAILY 90 capsule 3  . Multiple Vitamin (MULTIVITAMIN) tablet Take 1 tablet by mouth daily.    Marland Kitchen omeprazole (PRILOSEC) 20 MG capsule TAKE 1 CAPSULE BY MOUTH  DAILY 90 capsule 3  . rosuvastatin (CRESTOR) 5 MG tablet Take 1 tablet (5 mg total) by mouth daily. 90 tablet 3  . Na Sulfate-K Sulfate-Mg Sulf 17.5-3.13-1.6 GM/177ML SOLN At 5 PM the day before procedure take 1 bottle and 5 hours before procedure take 1 bottle. 354 mL 0   No current facility-administered medications for this visit.    Allergies as of 02/14/2021  . (No Known Allergies)    ROS:  General: Negative for anorexia, weight loss, fever, chills, fatigue, weakness. ENT: Negative for hoarseness, difficulty swallowing , nasal congestion. CV: Negative for chest pain, angina,  palpitations, dyspnea on exertion, peripheral edema.  Respiratory: Negative for dyspnea at rest, dyspnea on exertion, cough, sputum, wheezing.  GI: See history of present illness. GU:  Negative for dysuria, hematuria, urinary incontinence, urinary frequency, nocturnal urination.  Endo: Negative for unusual weight change.    Physical Examination:   BP 113/77   Pulse (!) 52   Temp 98.2 F (36.8 C) (Oral)   Ht 5' 8.5" (1.74 m)   Wt 218 lb 6.4 oz (99.1 kg)   LMP 07/17/2012   BMI 32.72 kg/m   General: Well-nourished, well-developed in no acute distress.  Eyes: No icterus. Conjunctivae pink. Mouth: Oropharyngeal mucosa moist and pink , no lesions erythema or exudate. Neck: Supple, Trachea midline Abdomen: Bowel sounds are normal, nontender, nondistended, no hepatosplenomegaly or masses, no abdominal bruits or hernia , no rebound or guarding.   Extremities: No lower extremity edema. No clubbing or deformities. Neuro: Alert and oriented x 3.  Grossly intact. Skin: Warm and dry, no jaundice.   Psych: Alert and cooperative, normal mood and affect.   Labs: CMP     Component Value Date/Time   NA 139 12/14/2020 0749   K 4.0 12/14/2020 0749   CL 102 12/14/2020 0749   CO2 29 12/14/2020 0749   GLUCOSE 92 12/14/2020 0749   BUN 14 12/14/2020 0749   CREATININE 0.62 12/14/2020 0749   CALCIUM 9.1 12/14/2020 0749   PROT 6.8 12/14/2020 0749   ALBUMIN 4.4 12/14/2020 0749  AST 18 12/14/2020 0749   ALT 19 12/14/2020 0749   ALKPHOS 94 12/14/2020 0749   BILITOT 0.9 12/14/2020 0749   Lab Results  Component Value Date   WBC 5.1 05/07/2014   HGB 13.4 05/07/2014   HCT 40.0 05/07/2014   MCV 94.3 05/07/2014   PLT 250.0 05/07/2014    Imaging Studies: No results found.  Assessment and Plan:   Katherine Steele is a 50 y.o. y/o female referred for GERD  Patient was previously scheduled for EGD and colonoscopy but never had these done  She is now due for colonoscopy for screening given that  she has not had any colorectal cancer screening in the last year  In addition, given her intermittent bright red blood per rectum important to rule out any underlying lesions such as malignancy  EGD also recommended due to finding of intestinal metaplasia on previous biopsies of the proximal esophagus  I have discussed alternative options, risks & benefits,  which include, but are not limited to, bleeding, infection, perforation,respiratory complication & drug reaction.  The patient agrees with this plan & written consent will be obtained.    Patient educated extensively on acid reflux lifestyle modification, including buying a bed wedge, not eating 3 hrs before bedtime, diet modifications, and handout given for the same.   (Risks of PPI use were discussed with patient including bone loss, C. Diff diarrhea, pneumonia, infections, CKD, electrolyte abnormalities.  Pt. Verbalizes understanding and chooses to continue the medication.)     Dr Katherine Steele

## 2021-02-22 ENCOUNTER — Telehealth: Payer: Self-pay

## 2021-02-22 NOTE — Telephone Encounter (Signed)
plz notify pt - omeprazole 20mg  is not covered by insurance likely because it is OTC dosing.  Alternative includes trying lower dose omeprazole 10mg  daily or changing to different heartburn medicine at a higher dose - which would she like to do?

## 2021-02-22 NOTE — Telephone Encounter (Signed)
Received fax from [facility] that stated patients current prescription of Omeprazole 20 mg is not covered by her insurance. Alternative options include:  -Omeprazole 10 mg -Esomeprazole 40 mg -Lansoprazole 30 mg   Would you like to change prescription or submit PA? Please advise.

## 2021-02-23 MED ORDER — OMEPRAZOLE 40 MG PO CPDR: 40.0000 mg | DELAYED_RELEASE_CAPSULE | Freq: Every day | ORAL | 3 refills | Status: DC

## 2021-02-23 NOTE — Telephone Encounter (Signed)
Spoke with pt relaying Dr. Timoteo Expose message.  Pt verbalizes understanding.  Says she had actually already started taking 2 of the omeprazole 20 mg tabs, which is helping.  She mentioned this to GI and they were ok with the increased dose.  Pt requests rx for omeprazole 40 mg be sent to [facility].

## 2021-02-23 NOTE — Addendum Note (Signed)
Addended by: Eustaquio Boyden on: 02/23/2021 12:02 PM   Modules accepted: Orders

## 2021-03-02 ENCOUNTER — Encounter: Payer: Self-pay | Admitting: Family Medicine

## 2021-03-02 MED ORDER — FLUOXETINE HCL 20 MG PO TABS: 40.0000 mg | ORAL_TABLET | Freq: Every day | ORAL | 3 refills | Status: DC

## 2021-03-30 ENCOUNTER — Encounter: Payer: Self-pay | Admitting: Gastroenterology

## 2021-03-31 ENCOUNTER — Other Ambulatory Visit: Payer: Self-pay

## 2021-03-31 ENCOUNTER — Ambulatory Visit: Payer: BC Managed Care – PPO | Admitting: Anesthesiology

## 2021-03-31 ENCOUNTER — Encounter: Payer: Self-pay | Admitting: Gastroenterology

## 2021-03-31 ENCOUNTER — Encounter
Admission: RE | Disposition: A | Discharge status: Home / Self Care | Payer: Self-pay | Source: Home / Self Care | Attending: Gastroenterology

## 2021-03-31 ENCOUNTER — Ambulatory Visit
Admission: RE | Admit: 2021-03-31 | Discharge: 2021-03-31 | Disposition: A | Discharge status: Home / Self Care | Payer: BC Managed Care – PPO | Attending: Gastroenterology | Admitting: Gastroenterology

## 2021-03-31 DIAGNOSIS — K449 Diaphragmatic hernia without obstruction or gangrene: Secondary | ICD-10-CM | POA: Diagnosis not present

## 2021-03-31 DIAGNOSIS — Z8041 Family history of malignant neoplasm of ovary: Secondary | ICD-10-CM | POA: Insufficient documentation

## 2021-03-31 DIAGNOSIS — K222 Esophageal obstruction: Secondary | ICD-10-CM | POA: Diagnosis not present

## 2021-03-31 DIAGNOSIS — Q402 Other specified congenital malformations of stomach: Secondary | ICD-10-CM | POA: Diagnosis not present

## 2021-03-31 DIAGNOSIS — Z1211 Encounter for screening for malignant neoplasm of colon: Secondary | ICD-10-CM | POA: Diagnosis not present

## 2021-03-31 DIAGNOSIS — K2289 Other specified disease of esophagus: Secondary | ICD-10-CM | POA: Insufficient documentation

## 2021-03-31 DIAGNOSIS — Z87891 Personal history of nicotine dependence: Secondary | ICD-10-CM | POA: Insufficient documentation

## 2021-03-31 DIAGNOSIS — K2270 Barrett's esophagus without dysplasia: Secondary | ICD-10-CM

## 2021-03-31 DIAGNOSIS — Z79899 Other long term (current) drug therapy: Secondary | ICD-10-CM | POA: Diagnosis not present

## 2021-03-31 HISTORY — PX: COLONOSCOPY WITH PROPOFOL: SHX5780

## 2021-03-31 HISTORY — PX: ESOPHAGOGASTRODUODENOSCOPY (EGD) WITH PROPOFOL: SHX5813

## 2021-03-31 SURGERY — ESOPHAGOGASTRODUODENOSCOPY (EGD) WITH PROPOFOL
Anesthesia: General

## 2021-03-31 MED ORDER — PROPOFOL 500 MG/50ML IV EMUL
INTRAVENOUS | Status: AC
  Filled 2021-03-31: qty 50

## 2021-03-31 MED ORDER — LIDOCAINE HCL (CARDIAC) PF 100 MG/5ML IV SOSY
PREFILLED_SYRINGE | INTRAVENOUS | Status: DC | PRN
  Administered 2021-03-31: 60 mg via INTRAVENOUS

## 2021-03-31 MED ORDER — MIDAZOLAM HCL 2 MG/2ML IJ SOLN
INTRAMUSCULAR | Status: AC
  Filled 2021-03-31: qty 2

## 2021-03-31 MED ORDER — FENTANYL CITRATE (PF) 100 MCG/2ML IJ SOLN
INTRAMUSCULAR | Status: AC
  Filled 2021-03-31: qty 2

## 2021-03-31 MED ORDER — LIDOCAINE HCL (PF) 2 % IJ SOLN
INTRAMUSCULAR | Status: AC
  Filled 2021-03-31: qty 5

## 2021-03-31 MED ORDER — SODIUM CHLORIDE 0.9 % IV SOLN: INTRAVENOUS | Status: DC

## 2021-03-31 MED ORDER — MIDAZOLAM HCL 2 MG/2ML IJ SOLN
INTRAMUSCULAR | Status: DC | PRN
  Administered 2021-03-31: 2 mg via INTRAVENOUS

## 2021-03-31 MED ORDER — PROPOFOL 500 MG/50ML IV EMUL
INTRAVENOUS | Status: DC | PRN
  Administered 2021-03-31: 75 ug/kg/min via INTRAVENOUS

## 2021-03-31 MED ORDER — FENTANYL CITRATE (PF) 100 MCG/2ML IJ SOLN
INTRAMUSCULAR | Status: DC | PRN
  Administered 2021-03-31 (×4): 25 ug via INTRAVENOUS

## 2021-03-31 MED ORDER — PROPOFOL 10 MG/ML IV BOLUS
INTRAVENOUS | Status: DC | PRN
  Administered 2021-03-31: 50 mg via INTRAVENOUS

## 2021-03-31 NOTE — H&P (Signed)
Melodie Bouillon, MD [address], [address], [place], [place], [address] [address], [address], [place], [place], [address] Phone: [phone]  Fax: [phone]  Primary Care Physician:  Eustaquio Boyden, MD   Pre-Procedure History & Physical: HPI:  Katherine Steele is a 50 y.o. female is here for a colonoscopy and EGD.   Past Medical History:  Diagnosis Date  . BMI 33.0-33.9,adult   . Endometriosis    s/p hysterectomy  . GAD (generalized anxiety disorder)    Dr. Lucianne Muss psychiatrist  . Gender dysphoria 2014   did undergo 10 mo testosterone treatment  . GERD (gastroesophageal reflux disease)   . HLD (hyperlipidemia) 08/12/2014   Mild off meds   . MDD (major depressive disorder)    Dr. Lucianne Muss psychiatrist  . PONV (postoperative nausea and vomiting)    after finger surgery    Past Surgical History:  Procedure Laterality Date  . CESAREAN SECTION    . COLONOSCOPY  2007   WNL [facility])  . ESOPHAGOGASTRODUODENOSCOPY (EGD) WITH PROPOFOL N/A 03/21/2018   barrett's without dysplasia, mild chronic gastritis, reflux gastroesophagitis (Shawnda Mauney, [name], MD)  . FINGER SURGERY Right 2007   little finger  . OOPHORECTOMY Left 2006  . TONSILLECTOMY  1975  . TOTAL ABDOMINAL HYSTERECTOMY  12/2012   endometriosis and ovarian cysts s/p ovaries removed  . TYMPANOSTOMY TUBE PLACEMENT Bilateral 1976    Prior to Admission medications   Medication Sig Start Date End Date Taking? Authorizing Provider  FLUoxetine (PROZAC) 20 MG tablet Take 2 tablets (40 mg total) by mouth daily. 03/02/21  Yes Eustaquio Boyden, MD  Multiple Vitamin (MULTIVITAMIN) tablet Take 1 tablet by mouth daily.   Yes [provider]  omeprazole (PRILOSEC) 40 MG capsule Take 1 capsule (40 mg total) by mouth daily. 02/23/21  Yes Eustaquio Boyden, MD  Na Sulfate-K Sulfate-Mg Sulf 17.5-3.13-1.6 GM/177ML SOLN At 5 PM the day before procedure take 1 bottle and 5 hours before procedure take 1 bottle. Patient not  taking: Reported on 03/31/2021 02/14/21   Pasty Spillers, MD  rosuvastatin (CRESTOR) 5 MG tablet Take 1 tablet (5 mg total) by mouth daily. 02/09/21   Eustaquio Boyden, MD    Allergies as of 02/16/2021  . (No Known Allergies)    Family History  Problem Relation Age of Onset  . Diabetes Mother   . Cancer Mother 1       ovarian with mets  . Rheum arthritis Mother   . Hypertension Father   . Alcohol abuse Father        history  . Autism spectrum disorder Son   . CAD Maternal Grandfather 60       MI  . CAD Maternal Uncle 70       MI  . Rheum arthritis Maternal Aunt   . Stroke Neg Hx     Social History   Socioeconomic History  . Marital status: Married    Spouse name: Not on file  . Number of children: Not on file  . Years of education: Not on file  . Highest education level: Not on file  Occupational History  . Not on file  Tobacco Use  . Smoking status: Former Smoker    Types: Cigarettes    Quit date: 02/06/1993    Years since quitting: 28.1  . Smokeless tobacco: Never Used  Vaping Use  . Vaping Use: Never used  Substance and Sexual Activity  . Alcohol use: Yes    Comment: social/weekends  . Drug use:  No  . Sexual activity: Yes  Other Topics Concern  . Not on file  Social History Narrative   Lives with husband [name]) and son [name])   Occupation: Lexicographer   Edu: Associate's degree   Activity: exercise 3x/wk   Diet: good water, fruits/vegetables some   Social Determinants of Health   Financial Resource Strain: Not on file  Food Insecurity: Not on file  Transportation Needs: Not on file  Physical Activity: Not on file  Stress: Not on file  Social Connections: Not on file  Intimate Partner Violence: Not on file    Review of Systems: See HPI, otherwise negative ROS  Physical Exam: BP 102/74   Pulse 66   Temp 98 F (36.7 C) (Temporal)   Resp 18   Ht 5\' 8"  (1.727 m)   Wt 99.8 kg   LMP 07/17/2012   SpO2 97%   BMI 33.45 kg/m   General:   Alert,  pleasant and cooperative in NAD Head:  Normocephalic and atraumatic. Neck:  Supple; no masses or thyromegaly. Lungs:  Clear throughout to auscultation, normal respiratory effort.    Heart:  +S1, +S2, Regular rate and rhythm, No edema. Abdomen:  Soft, nontender and nondistended. Normal bowel sounds, without guarding, and without rebound.   Neurologic:  Alert and  oriented x4;  grossly normal neurologically.  Impression/Plan: Katherine Steele is here for a colonoscopy to be performed for average risk screening and EGD for intestinal metaplasia of the esophagus.  Risks, benefits, limitations, and alternatives regarding the procedures have been reviewed with the patient.  Questions have been answered.  All parties agreeable.   Lovie Macadamia, MD  03/31/2021, 7:32 AM

## 2021-03-31 NOTE — Op Note (Signed)
[facility] Gastroenterology Patient Name: Katherine Steele Procedure Date: 03/31/2021 7:21 AM MRN: [id number] Account #: [id number] Date of Birth: [birth year] Admit Type: Outpatient Age: 50 Room: [facility] ENDO ROOM 2 Gender: Female Note Status: Finalized Procedure:             Upper GI endoscopy Indications:           Follow-up of intestinal metaplasia, Pt denies any                         dysphagia. reports complete resolution of dysphagia                         since dilation in 2019 Providers:             Omolola Mittman B. [name] MD, MD Referring MD:          Eustaquio Boyden (Referring MD) Medicines:             Monitored Anesthesia Care Complications:         No immediate complications. Procedure:             Pre-Anesthesia Assessment:                        - Prior to the procedure, a History and Physical was                         performed, and patient medications, allergies and                         sensitivities were reviewed. The patient's tolerance                         of previous anesthesia was reviewed.                        - The risks and benefits of the procedure and the                         sedation options and risks were discussed with the                         patient. All questions were answered and informed                         consent was obtained.                        - Patient identification and proposed procedure were                         verified prior to the procedure by the physician, the                         nurse, the anesthesiologist, the anesthetist and the                         technician. The procedure was verified in the  procedure room.                        - ASA Grade Assessment: II - A patient with mild                         systemic disease.                        After obtaining informed consent, the endoscope was                         passed under direct vision. Throughout the  procedure,                         the patient's blood pressure, pulse, and oxygen                         saturations were monitored continuously. The Endoscope                         was introduced through the mouth, and advanced to the                         second part of duodenum. The upper GI endoscopy was                         accomplished with ease. The patient tolerated the                         procedure well. Findings:      Circumferential salmon-colored mucosa was present from 17 to 19 cm. No       other visible abnormalities were present. The maximum longitudinal       extent of these esophageal mucosal changes was 2 cm in length. Biopsies       were taken with a cold forceps for histology. Imaging was performed       using narrow band imaging to visualize the mucosa.      A non-obstructing Schatzki ring was found at the gastroesophageal       junction.      The exam of the esophagus was otherwise normal.      A small hiatal hernia was present.      The exam of the stomach was otherwise normal.      The entire examined stomach was normal.      Patchy mild mucosal changes characterized by discoloration were found in       the second portion of the duodenum. Biopsies were taken with a cold       forceps for histology.      The exam of the duodenum was otherwise normal.      The duodenal bulb, second portion of the duodenum and examined duodenum       were normal. Impression:            - Salmon-colored mucosa suspicious for short-segment                         Barrett's esophagus. Biopsied.                        -  Non-obstructing Schatzki ring.                        - Small hiatal hernia.                        - Normal stomach.                        - Mucosal changes in the duodenum. Biopsied.                        - Normal duodenal bulb, second portion of the duodenum                         and examined duodenum. Recommendation:        - Await pathology  results.                        - Discharge patient to home (with escort).                        - Advance diet as tolerated.                        - Continue present medications.                        - Patient has a contact number available for                         emergencies. The signs and symptoms of potential                         delayed complications were discussed with the patient.                         Return to normal activities tomorrow. Written                         discharge instructions were provided to the patient.                        - Discharge patient to home (with escort).                        - The findings and recommendations were discussed with                         the patient.                        - The findings and recommendations were discussed with                         the patient's family. Procedure Code(s):     --- Professional ---                        [phone], Esophagogastroduodenoscopy, flexible,  transoral; with biopsy, single or multiple Diagnosis Code(s):     --- Professional ---                        K22.8, Other specified diseases of esophagus                        K22.2, Esophageal obstruction                        K44.9, Diaphragmatic hernia without obstruction or                         gangrene                        K31.89, Other diseases of stomach and duodenum CPT copyright 2019 American Medical Association. All rights reserved. The codes documented in this report are preliminary and upon coder review may  be revised to meet current compliance requirements.  Melodie Bouillon, MD Michel Bickers B. [name] MD, MD 03/31/2021 7:59:04 AM This report has been signed electronically. Number of Addenda: 0 Note Initiated On: 03/31/2021 7:21 AM Estimated Blood Loss:  Estimated blood loss: none.      [facility]

## 2021-03-31 NOTE — Op Note (Signed)
[facility] Gastroenterology Patient Name: Katherine Steele Procedure Date: 03/31/2021 7:21 AM MRN: [id number] Account #: [id number] Date of Birth: 03-Nov-1971 Admit Type: Outpatient Age: 50 Room: [facility] ENDO ROOM 2 Gender: Female Note Status: Finalized Procedure:             Colonoscopy Indications:           Screening for colorectal malignant neoplasm Providers:             Roberta Angell B. [name] MD, MD Referring MD:          Eustaquio Boyden (Referring MD) Medicines:             Monitored Anesthesia Care Complications:         No immediate complications. Procedure:             Pre-Anesthesia Assessment:                        - Prior to the procedure, a History and Physical was                         performed, and patient medications, allergies and                         sensitivities were reviewed. The patient's tolerance                         of previous anesthesia was reviewed.                        - The risks and benefits of the procedure and the                         sedation options and risks were discussed with the                         patient. All questions were answered and informed                         consent was obtained.                        - Patient identification and proposed procedure were                         verified prior to the procedure by the physician, the                         nurse, the anesthetist and the technician. The                         procedure was verified in the pre-procedure area in                         the procedure room in the endoscopy suite.                        - ASA Grade Assessment: II - A patient with mild  systemic disease.                        - After reviewing the risks and benefits, the patient                         was deemed in satisfactory condition to undergo the                         procedure.                        After obtaining informed consent, the  colonoscope was                         passed under direct vision. Throughout the procedure,                         the patient's blood pressure, pulse, and oxygen                         saturations were monitored continuously. The                         Colonoscope was introduced through the anus and                         advanced to the the terminal ileum. The colonoscopy                         was performed with ease. The patient tolerated the                         procedure well. The quality of the bowel preparation                         was good. Findings:      The perianal and digital rectal examinations were normal.      The rectum, sigmoid colon, descending colon, transverse colon, ascending       colon and cecum appeared normal.      The retroflexed view of the distal rectum and anal verge was normal and       showed no anal or rectal abnormalities. Impression:            - The rectum, sigmoid colon, descending colon,                         transverse colon, ascending colon and cecum are normal.                        - The distal rectum and anal verge are normal on                         retroflexion view.                        - No specimens collected. Recommendation:        - Discharge patient to home.                        -  Resume previous diet.                        - Continue present medications.                        - Repeat colonoscopy in 10 years for screening                         purposes.                        - Return to primary care physician as previously                         scheduled.                        - The findings and recommendations were discussed with                         the patient.                        - The findings and recommendations were discussed with                         the patient's family. Procedure Code(s):     --- Professional ---                        [phone], Colonoscopy, flexible; diagnostic,  including                         collection of specimen(s) by brushing or washing, when                         performed (separate procedure) Diagnosis Code(s):     --- Professional ---                        Z12.11, Encounter for screening for malignant neoplasm                         of colon CPT copyright 2019 American Medical Association. All rights reserved. The codes documented in this report are preliminary and upon coder review may  be revised to meet current compliance requirements.  Melodie Bouillon, MD Michel Bickers B. [name] MD, MD 03/31/2021 8:16:20 AM This report has been signed electronically. Number of Addenda: 0 Note Initiated On: 03/31/2021 7:21 AM Scope Withdrawal Time: 0 hours 9 minutes 21 seconds  Total Procedure Duration: 0 hours 12 minutes 10 seconds       [employer]

## 2021-03-31 NOTE — Transfer of Care (Signed)
Immediate Anesthesia Transfer of Care Note  Patient: Katherine Steele  Procedure(s) Performed: ESOPHAGOGASTRODUODENOSCOPY (EGD) WITH PROPOFOL (N/A ) COLONOSCOPY WITH PROPOFOL (N/A )  Patient Location: PACU  Anesthesia Type:General  Level of Consciousness: sedated  Airway & Oxygen Therapy: Patient Spontanous Breathing and Patient connected to nasal cannula oxygen  Post-op Assessment: Report given to RN and Post -op Vital signs reviewed and stable  Post vital signs: Reviewed and stable  Last Vitals:  Vitals Value Taken Time  BP    Temp    Pulse 57 03/31/21 0816  Resp 17 03/31/21 0816  SpO2 94 % 03/31/21 0816  Vitals shown include unvalidated device data.  Last Pain:  Vitals:   03/31/21 0705  TempSrc: Temporal  PainSc: 0-No pain         Complications: No complications documented.

## 2021-03-31 NOTE — Anesthesia Preprocedure Evaluation (Addendum)
Anesthesia Evaluation  Patient identified by MRN, date of birth, ID band Patient awake    Reviewed: Allergy & Precautions, H&P , NPO status , Patient's Chart, lab work & pertinent test results  History of Anesthesia Complications (+) PONVNegative for: history of anesthetic complications  Airway Mallampati: II  TM Distance: >3 FB     Dental  (+) Teeth Intact   Pulmonary neg sleep apnea, neg COPD, former smoker,    breath sounds clear to auscultation       Cardiovascular (-) angina(-) Past MI and (-) Cardiac Stents negative cardio ROS  (-) dysrhythmias  Rhythm:regular Rate:Normal     Neuro/Psych PSYCHIATRIC DISORDERS Anxiety Depression negative neurological ROS     GI/Hepatic Neg liver ROS, hiatal hernia, GERD  ,  Endo/Other  negative endocrine ROS  Renal/GU negative Renal ROS  negative genitourinary   Musculoskeletal   Abdominal   Peds  Hematology negative hematology ROS (+)   Anesthesia Other Findings Past Medical History: No date: BMI 33.0-33.9,adult No date: Endometriosis     Comment:  s/p hysterectomy No date: GAD (generalized anxiety disorder)     Comment:  Dr. Lucianne Muss psychiatrist 2014: Gender dysphoria     Comment:  did undergo 10 mo testosterone treatment No date: GERD (gastroesophageal reflux disease) 08/12/2014: HLD (hyperlipidemia)     Comment:  Mild off meds  No date: MDD (major depressive disorder)     Comment:  Dr. Lucianne Muss psychiatrist No date: PONV (postoperative nausea and vomiting)     Comment:  after finger surgery  Past Surgical History: No date: CESAREAN SECTION 2007: COLONOSCOPY     Comment:  WNL () 03/21/2018: ESOPHAGOGASTRODUODENOSCOPY (EGD) WITH PROPOFOL; N/A     Comment:  barrett's without dysplasia, mild chronic gastritis,               reflux gastroesophagitis [name], [name], MD) 2007: FINGER SURGERY; Right     Comment:  little finger 2006: OOPHORECTOMY; Left 1975:  TONSILLECTOMY 12/2012: TOTAL ABDOMINAL HYSTERECTOMY     Comment:  endometriosis and ovarian cysts s/p ovaries removed 1976: TYMPANOSTOMY TUBE PLACEMENT; Bilateral     Reproductive/Obstetrics negative OB ROS                            Anesthesia Physical Anesthesia Plan  ASA: II  Anesthesia Plan: General   Post-op Pain Management:    Induction:   PONV Risk Score and Plan: Propofol infusion and TIVA  Airway Management Planned: Nasal Cannula  Additional Equipment:   Intra-op Plan:   Post-operative Plan:   Informed Consent: I have reviewed the patients History and Physical, chart, labs and discussed the procedure including the risks, benefits and alternatives for the proposed anesthesia with the patient or authorized representative who has indicated his/her understanding and acceptance.     Dental Advisory Given  Plan Discussed with: Anesthesiologist, CRNA and Surgeon  Anesthesia Plan Comments:         Anesthesia Quick Evaluation

## 2021-03-31 NOTE — Anesthesia Postprocedure Evaluation (Signed)
Anesthesia Post Note  Patient: Katherine Steele  Procedure(s) Performed: ESOPHAGOGASTRODUODENOSCOPY (EGD) WITH PROPOFOL (N/A ) COLONOSCOPY WITH PROPOFOL (N/A )  Patient location during evaluation: PACU Anesthesia Type: General Level of consciousness: awake and alert Pain management: pain level controlled Vital Signs Assessment: post-procedure vital signs reviewed and stable Respiratory status: spontaneous breathing, nonlabored ventilation and respiratory function stable Cardiovascular status: blood pressure returned to baseline and stable Postop Assessment: no apparent nausea or vomiting Anesthetic complications: no   No complications documented.   Last Vitals:  Vitals:   03/31/21 0816 03/31/21 0826  BP: 99/60 (!) 86/59  Pulse: (!) 55 (!) 54  Resp: 16   Temp: (!) 36.4 C   SpO2: 94%     Last Pain:  Vitals:   03/31/21 0846  TempSrc:   PainSc: 0-No pain                 Aurelio Brash [place]

## 2021-04-03 ENCOUNTER — Encounter: Payer: Self-pay | Admitting: Gastroenterology

## 2021-04-04 LAB — SURGICAL PATHOLOGY

## 2021-04-06 ENCOUNTER — Encounter: Payer: Self-pay | Admitting: Family Medicine

## 2021-04-06 ENCOUNTER — Encounter: Payer: Self-pay | Admitting: Gastroenterology

## 2021-05-17 ENCOUNTER — Ambulatory Visit: Payer: BC Managed Care – PPO | Admitting: Gastroenterology

## 2021-06-24 ENCOUNTER — Other Ambulatory Visit: Payer: Self-pay | Admitting: Family Medicine

## 2021-06-24 DIAGNOSIS — E7800 Pure hypercholesterolemia, unspecified: Secondary | ICD-10-CM

## 2021-06-24 DIAGNOSIS — K2270 Barrett's esophagus without dysplasia: Secondary | ICD-10-CM

## 2021-06-26 ENCOUNTER — Other Ambulatory Visit (INDEPENDENT_AMBULATORY_CARE_PROVIDER_SITE_OTHER): Payer: BC Managed Care – PPO

## 2021-06-26 ENCOUNTER — Other Ambulatory Visit: Payer: Self-pay

## 2021-06-26 DIAGNOSIS — E7800 Pure hypercholesterolemia, unspecified: Secondary | ICD-10-CM

## 2021-06-26 DIAGNOSIS — K2270 Barrett's esophagus without dysplasia: Secondary | ICD-10-CM

## 2021-06-26 LAB — CBC WITH DIFFERENTIAL/PLATELET
Basophils Absolute: 0.1 10*3/uL (ref 0.0–0.1)
Basophils Relative: 0.9 % (ref 0.0–3.0)
Eosinophils Absolute: 0.2 10*3/uL (ref 0.0–0.7)
Eosinophils Relative: 2.3 % (ref 0.0–5.0)
HCT: 41.5 % (ref 36.0–46.0)
Hemoglobin: 13.8 g/dL (ref 12.0–15.0)
Lymphocytes Relative: 25.6 % (ref 12.0–46.0)
Lymphs Abs: 1.7 10*3/uL (ref 0.7–4.0)
MCHC: 33.3 g/dL (ref 30.0–36.0)
MCV: 86.4 fl (ref 78.0–100.0)
Monocytes Absolute: 0.4 10*3/uL (ref 0.1–1.0)
Monocytes Relative: 6.6 % (ref 3.0–12.0)
Neutro Abs: 4.4 10*3/uL (ref 1.4–7.7)
Neutrophils Relative %: 64.6 % (ref 43.0–77.0)
Platelets: 272 10*3/uL (ref 150.0–400.0)
RBC: 4.81 Mil/uL (ref 3.87–5.11)
RDW: 13.7 % (ref 11.5–15.5)
WBC: 6.7 10*3/uL (ref 4.0–10.5)

## 2021-06-26 LAB — LIPID PANEL
Cholesterol: 213 mg/dL — ABNORMAL HIGH (ref 0–200)
HDL: 57.5 mg/dL (ref >39.00)
LDL Cholesterol: 131 mg/dL — ABNORMAL HIGH (ref 0–99)
NonHDL: 155.82
Total CHOL/HDL Ratio: 4
Triglycerides: 125 mg/dL (ref 0.0–149.0)
VLDL: 25 mg/dL (ref 0.0–40.0)

## 2021-06-26 LAB — COMPREHENSIVE METABOLIC PANEL
ALT: 11 U/L (ref 0–35)
AST: 13 U/L (ref 0–37)
Albumin: 4.3 g/dL (ref 3.5–5.2)
Alkaline Phosphatase: 94 U/L (ref 39–117)
BUN: 15 mg/dL (ref 6–23)
CO2: 29 mEq/L (ref 19–32)
Calcium: 9.3 mg/dL (ref 8.4–10.5)
Chloride: 102 mEq/L (ref 96–112)
Creatinine, Ser: 0.66 mg/dL (ref 0.40–1.20)
GFR: 101.27 mL/min (ref >60.00)
Glucose, Bld: 97 mg/dL (ref 70–99)
Potassium: 4.7 mEq/L (ref 3.5–5.1)
Sodium: 139 mEq/L (ref 135–145)
Total Bilirubin: 0.6 mg/dL (ref 0.2–1.2)
Total Protein: 6.6 g/dL (ref 6.0–8.3)

## 2021-06-26 LAB — TSH: TSH: 2.88 u[IU]/mL (ref 0.35–5.50)

## 2021-06-27 ENCOUNTER — Encounter: Payer: Self-pay | Admitting: Family Medicine

## 2021-12-18 ENCOUNTER — Other Ambulatory Visit: Payer: Self-pay | Admitting: Family Medicine

## 2021-12-18 DIAGNOSIS — E7800 Pure hypercholesterolemia, unspecified: Secondary | ICD-10-CM

## 2021-12-22 ENCOUNTER — Other Ambulatory Visit: Payer: BC Managed Care – PPO

## 2021-12-29 ENCOUNTER — Other Ambulatory Visit: Payer: Self-pay

## 2021-12-29 ENCOUNTER — Encounter: Payer: BC Managed Care – PPO | Admitting: Family Medicine

## 2021-12-29 ENCOUNTER — Other Ambulatory Visit (INDEPENDENT_AMBULATORY_CARE_PROVIDER_SITE_OTHER): Payer: BC Managed Care – PPO

## 2021-12-29 DIAGNOSIS — E7800 Pure hypercholesterolemia, unspecified: Secondary | ICD-10-CM

## 2021-12-29 LAB — COMPREHENSIVE METABOLIC PANEL
ALT: 37 U/L — ABNORMAL HIGH (ref 0–35)
AST: 24 U/L (ref 0–37)
Albumin: 4.4 g/dL (ref 3.5–5.2)
Alkaline Phosphatase: 82 U/L (ref 39–117)
BUN: 12 mg/dL (ref 6–23)
CO2: 29 mEq/L (ref 19–32)
Calcium: 9.2 mg/dL (ref 8.4–10.5)
Chloride: 103 mEq/L (ref 96–112)
Creatinine, Ser: 0.65 mg/dL (ref 0.40–1.20)
GFR: 101.28 mL/min (ref >60.00)
Glucose, Bld: 97 mg/dL (ref 70–99)
Potassium: 4.3 mEq/L (ref 3.5–5.1)
Sodium: 139 mEq/L (ref 135–145)
Total Bilirubin: 0.9 mg/dL (ref 0.2–1.2)
Total Protein: 7 g/dL (ref 6.0–8.3)

## 2021-12-29 LAB — LIPID PANEL
Cholesterol: 209 mg/dL — ABNORMAL HIGH (ref 0–200)
HDL: 65.5 mg/dL (ref >39.00)
LDL Cholesterol: 125 mg/dL — ABNORMAL HIGH (ref 0–99)
NonHDL: 143.75
Total CHOL/HDL Ratio: 3
Triglycerides: 92 mg/dL (ref 0.0–149.0)
VLDL: 18.4 mg/dL (ref 0.0–40.0)

## 2022-01-07 ENCOUNTER — Other Ambulatory Visit: Payer: Self-pay | Admitting: Family Medicine

## 2022-01-16 ENCOUNTER — Ambulatory Visit (INDEPENDENT_AMBULATORY_CARE_PROVIDER_SITE_OTHER): Payer: BC Managed Care – PPO | Admitting: Family Medicine

## 2022-01-16 ENCOUNTER — Encounter: Payer: Self-pay | Admitting: Family Medicine

## 2022-01-16 ENCOUNTER — Other Ambulatory Visit: Payer: Self-pay

## 2022-01-16 VITALS — BP 126/86 | HR 71 | Temp 97.5°F | Ht 67.5 in | Wt 222.2 lb

## 2022-01-16 DIAGNOSIS — Z1231 Encounter for screening mammogram for malignant neoplasm of breast: Secondary | ICD-10-CM | POA: Diagnosis not present

## 2022-01-16 DIAGNOSIS — E669 Obesity, unspecified: Secondary | ICD-10-CM

## 2022-01-16 DIAGNOSIS — E8940 Asymptomatic postprocedural ovarian failure: Secondary | ICD-10-CM

## 2022-01-16 DIAGNOSIS — K2100 Gastro-esophageal reflux disease with esophagitis, without bleeding: Secondary | ICD-10-CM

## 2022-01-16 DIAGNOSIS — K2270 Barrett's esophagus without dysplasia: Secondary | ICD-10-CM

## 2022-01-16 DIAGNOSIS — E7800 Pure hypercholesterolemia, unspecified: Secondary | ICD-10-CM

## 2022-01-16 DIAGNOSIS — Z0000 Encounter for general adult medical examination without abnormal findings: Secondary | ICD-10-CM | POA: Diagnosis not present

## 2022-01-16 DIAGNOSIS — F3342 Major depressive disorder, recurrent, in full remission: Secondary | ICD-10-CM

## 2022-01-16 MED ORDER — ROSUVASTATIN CALCIUM 5 MG PO TABS: 5.0000 mg | ORAL_TABLET | Freq: Every day | ORAL | 3 refills | Status: DC

## 2022-01-16 MED ORDER — OMEPRAZOLE 40 MG PO CPDR: 40.0000 mg | DELAYED_RELEASE_CAPSULE | Freq: Every day | ORAL | 3 refills | Status: DC

## 2022-01-16 NOTE — Assessment & Plan Note (Signed)
H/o this, latest biopsy negative for Barrett's.  Appreciate GI care ([place])

## 2022-01-16 NOTE — Assessment & Plan Note (Signed)
Preventative protocols reviewed and updated unless pt declined. Discussed healthy diet and lifestyle.  

## 2022-01-16 NOTE — Progress Notes (Signed)
Patient ID: Katherine Steele, female    DOB: 1971-08-10, 51 y.o.   MRN: [id number]  This visit was conducted in person.  BP 126/86    Pulse 71    Temp (!) 97.5 F (36.4 C) (Temporal)    Ht 5' 7.5" (1.715 m)    Wt 222 lb 3 oz (100.8 kg)    LMP 07/17/2012    SpO2 97%    BMI 34.29 kg/m    CC: CPE Subjective:   HPI: Katherine Steele is a 51 y.o. female presenting on 01/16/2022 for Annual Exam   Prozac stopped this month - weaned off slowly.   Barrett's esophagus without dysplasia by biopsy on EGD 03/2018 - doing well on omeprazole 20mg  daily. Dysphagia resolved after dilation of schatzki's ring. Continues daily PPI. Biopsy 03/2021 EGD - normal  Preventative: ESOPHAGOGASTRODUODENOSCOPY (EGD) WITH PROPOFOL 03/31/2021 - small HH, shatski ring, ?barretts - biopsies WNL Bonna Gains, [name], MD)  COLONOSCOPY WITH PROPOFOL 03/31/2021 - WNL, rpt 10 yrs (Tahiliani, Lennette Bihari, MD)  Well woman - previously saw OBGYN Dr. Waynetta Pean last seen yrs ago. S/p total hysterectomy with bilateral oophorectomy for endometriosis with dysmenorrhea (2014). No hormone replacement afterwards.  Mammogram - No fmhx breast cancer. Some trouble getting this covered by insurance.  Breast exam at home - without concerns.  Lung cancer screening - not eligible Flu shot yearly  COVID vaccine Moderna 02/2020, 03/2020, no booster Tdap 08/2005. Td 2016  Shingrix - discussed, declines as father had bad reaction to this vaccine Seat belt use discussed.  Sunscreen use discussed, no changing moles on skin.  Non smoker, husband smokes outdoors  Alcohol - rare  Dentist q6 mo  Eye exam yearly - may have worsening cataract    Lives with husband Ronalee Belts) and son Remo Lipps)  Occupation: Customer service manager  Edu: Associate's degree  Activity: exercise 2x/wk (walking)  Diet: good water, fruits/vegetables some      Relevant past medical, surgical, family and social history reviewed and updated as indicated. Interim medical history  since our last visit reviewed. Allergies and medications reviewed and updated. Outpatient Medications Prior to Visit  Medication Sig Dispense Refill   Multiple Vitamin (MULTIVITAMIN) tablet Take 1 tablet by mouth daily.     omeprazole (PRILOSEC) 40 MG capsule TAKE 1 CAPSULE BY MOUTH  DAILY 102 capsule 0   rosuvastatin (CRESTOR) 5 MG tablet TAKE 1 TABLET BY MOUTH  DAILY 102 tablet 0   FLUoxetine (PROZAC) 20 MG tablet Take 2 tablets (40 mg total) by mouth daily. (Patient not taking: Reported on 01/16/2022) 180 tablet 3   Na Sulfate-K Sulfate-Mg Sulf 17.5-3.13-1.6 GM/177ML SOLN At 5 PM the day before procedure take 1 bottle and 5 hours before procedure take 1 bottle. (Patient not taking: Reported on 03/31/2021) 354 mL 0   No facility-administered medications prior to visit.     Per HPI unless specifically indicated in ROS section below Review of Systems  Constitutional:  Negative for activity change, appetite change, chills, fatigue, fever and unexpected weight change.  HENT:  Negative for hearing loss.   Eyes:  Negative for visual disturbance.  Respiratory:  Negative for cough, chest tightness, shortness of breath and wheezing.   Cardiovascular:  Negative for chest pain, palpitations and leg swelling.  Gastrointestinal:  Negative for abdominal distention, abdominal pain, blood in stool, constipation, diarrhea, nausea and vomiting.  Genitourinary:  Negative for difficulty urinating and hematuria.  Musculoskeletal:  Negative for arthralgias, myalgias and neck pain.  Skin:  Negative  for rash.  Neurological:  Negative for dizziness, seizures, syncope and headaches.  Hematological:  Negative for adenopathy. Does not bruise/bleed easily.  Psychiatric/Behavioral:  Negative for dysphoric mood. The patient is not nervous/anxious.    Objective:  BP 126/86    Pulse 71    Temp (!) 97.5 F (36.4 C) (Temporal)    Ht 5' 7.5" (1.715 m)    Wt 222 lb 3 oz (100.8 kg)    LMP 07/17/2012    SpO2 97%    BMI 34.29  kg/m   Wt Readings from Last 3 Encounters:  01/16/22 222 lb 3 oz (100.8 kg)  03/31/21 220 lb (99.8 kg)  02/14/21 218 lb 6.4 oz (99.1 kg)      Physical Exam Vitals and nursing note reviewed.  Constitutional:      Appearance: Normal appearance. She is not ill-appearing.  HENT:     Head: Normocephalic and atraumatic.     Right Ear: Tympanic membrane, ear canal and external ear normal. There is no impacted cerumen.     Left Ear: Tympanic membrane, ear canal and external ear normal. There is no impacted cerumen.  Eyes:     General:        Right eye: No discharge.        Left eye: No discharge.     Extraocular Movements: Extraocular movements intact.     Conjunctiva/sclera: Conjunctivae normal.     Pupils: Pupils are equal, round, and reactive to light.  Neck:     Thyroid: No thyroid mass or thyromegaly.  Cardiovascular:     Rate and Rhythm: Normal rate and regular rhythm.     Pulses: Normal pulses.     Heart sounds: Normal heart sounds. No murmur heard. Pulmonary:     Effort: Pulmonary effort is normal. No respiratory distress.     Breath sounds: Normal breath sounds. No wheezing, rhonchi or rales.  Abdominal:     General: Bowel sounds are normal. There is no distension.     Palpations: Abdomen is soft. There is no mass.     Tenderness: There is no abdominal tenderness. There is no guarding or rebound.     Hernia: No hernia is present.  Musculoskeletal:     Cervical back: Normal range of motion and neck supple. No rigidity.     Right lower leg: No edema.     Left lower leg: No edema.  Lymphadenopathy:     Cervical: No cervical adenopathy.  Skin:    General: Skin is warm and dry.     Findings: No rash.  Neurological:     General: No focal deficit present.     Mental Status: She is alert. Mental status is at baseline.  Psychiatric:        Mood and Affect: Mood normal.        Behavior: Behavior normal.      Results for orders placed or performed in visit on 12/29/21   Comprehensive metabolic panel  Result Value Ref Range   Sodium 139 135 - 145 mEq/L   Potassium 4.3 3.5 - 5.1 mEq/L   Chloride 103 96 - 112 mEq/L   CO2 29 19 - 32 mEq/L   Glucose, Bld 97 70 - 99 mg/dL   BUN 12 6 - 23 mg/dL   Creatinine, Ser 1.60 0.40 - 1.20 mg/dL   Total Bilirubin 0.9 0.2 - 1.2 mg/dL   Alkaline Phosphatase 82 39 - 117 U/L   AST 24 0 - 37 U/L  ALT 37 (H) 0 - 35 U/L   Total Protein 7.0 6.0 - 8.3 g/dL   Albumin 4.4 3.5 - 5.2 g/dL   GFR 101.28 >60.00 mL/min   Calcium 9.2 8.4 - 10.5 mg/dL  Lipid panel  Result Value Ref Range   Cholesterol 209 (H) 0 - 200 mg/dL   Triglycerides 92.0 0.0 - 149.0 mg/dL   HDL 65.50 >39.00 mg/dL   VLDL 18.4 0.0 - 40.0 mg/dL   LDL Cholesterol 125 (H) 0 - 99 mg/dL   Total CHOL/HDL Ratio 3    NonHDL 143.75     Assessment & Plan:  This visit occurred during the SARS-CoV-2 public health emergency.  Safety protocols were in place, including screening questions prior to the visit, additional usage of staff PPE, and extensive cleaning of exam room while observing appropriate contact time as indicated for disinfecting solutions.   Problem List Items Addressed This Visit     Health maintenance examination - Primary (Chronic)    Preventative protocols reviewed and updated unless pt declined. Discussed healthy diet and lifestyle.       MDD (major depressive disorder)    Has been able to wean off prozac this month, doing well. Will stay off at this time.       HLD (hyperlipidemia)    Chronic, improved and stable on crestor 5mg  daily. Prior to statin LDL >190.  The 10-year ASCVD risk score (Arnett DK, et al., 2019) is: 1.3%   Values used to calculate the score:     Age: 35 years     Sex: Female     Is Non-Hispanic African American: No     Diabetic: No     Tobacco smoker: No     Systolic Blood Pressure: 123XX123 mmHg     Is BP treated: No     HDL Cholesterol: 65.5 mg/dL     Total Cholesterol: 209 mg/dL       Relevant Medications    rosuvastatin (CRESTOR) 5 MG tablet   Barrett's esophagus    H/o this, latest biopsy negative for Barrett's.  Appreciate GI care (Tahiliani)      Gastroesophageal reflux disease    Continues omeprazole 40mg  daily.       Relevant Medications   omeprazole (PRILOSEC) 40 MG capsule   Surgical menopause, asymptomatic    S/p surgical menopause age 42yo. Discussed dexa in next few years.       Obesity, Class I, BMI 30-34.9    Encouraged healthy diet and lifestyle choices for sustainable weight loss.       Other Visit Diagnoses     Breast cancer screening by mammogram       Relevant Orders   MM 3D SCREEN BREAST BILATERAL        Meds ordered this encounter  Medications   omeprazole (PRILOSEC) 40 MG capsule    Sig: Take 1 capsule (40 mg total) by mouth daily.    Dispense:  93 capsule    Refill:  3   rosuvastatin (CRESTOR) 5 MG tablet    Sig: Take 1 tablet (5 mg total) by mouth daily.    Dispense:  93 tablet    Refill:  3   Orders Placed This Encounter  Procedures   MM 3D SCREEN BREAST BILATERAL    Standing Status:   Future    Standing Expiration Date:   01/16/2023    Order Specific Question:   Reason for Exam (SYMPTOM  OR DIAGNOSIS REQUIRED)    Answer:  breast cancer screening    Order Specific Question:   Preferred imaging location?    Answer:   [facility]    Order Specific Question:   Is the patient pregnant?    Answer:   No     Patient instructions: Call to schedule mammogram at your convenience: [place] [phone] [facility] [phone] You are doing well today Continue current medicines Return as needed or in 1 year for next physical.   Follow up plan: Return in about 1 year (around 01/16/2023), or if symptoms worsen or fail to improve, for annual exam, prior fasting for blood work.  Ria Bush, MD

## 2022-01-16 NOTE — Assessment & Plan Note (Signed)
Continues omeprazole 40mg daily.  

## 2022-01-16 NOTE — Assessment & Plan Note (Signed)
Encouraged healthy diet and lifestyle choices for sustainable weight loss ?

## 2022-01-16 NOTE — Assessment & Plan Note (Signed)
Chronic, improved and stable on crestor 5mg  daily. Prior to statin LDL >190.  The 10-year ASCVD risk score (Arnett DK, et al., 2019) is: 1.3%   Values used to calculate the score:     Age: [age] years     Sex: Female     Is Non-Hispanic African American: No     Diabetic: No     Tobacco smoker: No     Systolic Blood Pressure: 126 mmHg     Is BP treated: No     HDL Cholesterol: 65.5 mg/dL     Total Cholesterol: 209 mg/dL

## 2022-01-16 NOTE — Assessment & Plan Note (Signed)
S/p surgical menopause age 51yo. Discussed dexa in next few years.

## 2022-01-16 NOTE — Patient Instructions (Addendum)
Call to schedule mammogram at your convenience: [facility] [phone] [facility] at [facility] [phone] You are doing well today Continue current medicines Return as needed or in 1 year for next physical.   Health Maintenance for Postmenopausal Women Menopause is a normal process in which your ability to get pregnant comes to an end. This process happens slowly over many months or years, usually between the ages of 5 and 24. Menopause is complete when you have missed your menstrual period for 12 months. It is important to talk with your health care provider about some of the most common conditions that affect women after menopause (postmenopausal women). These include heart disease, cancer, and bone loss (osteoporosis). Adopting a healthy lifestyle and getting preventive care can help to promote your health and wellness. The actions you take can also lower your chances of developing some of these common conditions. What are the signs and symptoms of menopause? During menopause, you may have the following symptoms: Hot flashes. These can be moderate or severe. Night sweats. Decrease in sex drive. Mood swings. Headaches. Tiredness (fatigue). Irritability. Memory problems. Problems falling asleep or staying asleep. Talk with your health care provider about treatment options for your symptoms. Do I need hormone replacement therapy? Hormone replacement therapy is effective in treating symptoms that are caused by menopause, such as hot flashes and night sweats. Hormone replacement carries certain risks, especially as you become older. If you are thinking about using estrogen or estrogen with progestin, discuss the benefits and risks with your health care provider. How can I reduce my risk for heart disease and stroke? The risk of heart disease, heart attack, and stroke increases as you age. One of the causes may be a change in the body's hormones during  menopause. This can affect how your body uses dietary fats, triglycerides, and cholesterol. Heart attack and stroke are medical emergencies. There are many things that you can do to help prevent heart disease and stroke. Watch your blood pressure High blood pressure causes heart disease and increases the risk of stroke. This is more likely to develop in people who have high blood pressure readings or are overweight. Have your blood pressure checked: Every 3-5 years if you are 40-88 years of age. Every year if you are 44 years old or older. Eat a healthy diet  Eat a diet that includes plenty of vegetables, fruits, low-fat dairy products, and lean protein. Do not eat a lot of foods that are high in solid fats, added sugars, or sodium. Get regular exercise Get regular exercise. This is one of the most important things you can do for your health. Most adults should: Try to exercise for at least 150 minutes each week. The exercise should increase your heart rate and make you sweat (moderate-intensity exercise). Try to do strengthening exercises at least twice each week. Do these in addition to the moderate-intensity exercise. Spend less time sitting. Even light physical activity can be beneficial. Other tips Work with your health care provider to achieve or maintain a healthy weight. Do not use any products that contain nicotine or tobacco. These products include cigarettes, chewing tobacco, and vaping devices, such as e-cigarettes. If you need help quitting, ask your health care provider. Know your numbers. Ask your health care provider to check your cholesterol and your blood sugar (glucose). Continue to have your blood tested as directed by your health care provider. Do I need screening for cancer? Depending on your health history  and family history, you may need to have cancer screenings at different stages of your life. This may include screening for: Breast cancer. Cervical cancer. Lung  cancer. Colorectal cancer. What is my risk for osteoporosis? After menopause, you may be at increased risk for osteoporosis. Osteoporosis is a condition in which bone destruction happens more quickly than new bone creation. To help prevent osteoporosis or the bone fractures that can happen because of osteoporosis, you may take the following actions: If you are 14-79 years old, get at least 1,000 mg of calcium and at least 600 international units (IU) of vitamin D per day. If you are older than age 41 but younger than age 48, get at least 1,200 mg of calcium and at least 600 international units (IU) of vitamin D per day. If you are older than age 54, get at least 1,200 mg of calcium and at least 800 international units (IU) of vitamin D per day. Smoking and drinking excessive alcohol increase the risk of osteoporosis. Eat foods that are rich in calcium and vitamin D, and do weight-bearing exercises several times each week as directed by your health care provider. How does menopause affect my mental health? Depression may occur at any age, but it is more common as you become older. Common symptoms of depression include: Feeling depressed. Changes in sleep patterns. Changes in appetite or eating patterns. Feeling an overall lack of motivation or enjoyment of activities that you previously enjoyed. Frequent crying spells. Talk with your health care provider if you think that you are experiencing any of these symptoms. General instructions See your health care provider for regular wellness exams and vaccines. This may include: Scheduling regular health, dental, and eye exams. Getting and maintaining your vaccines. These include: Influenza vaccine. Get this vaccine each year before the flu season begins. Pneumonia vaccine. Shingles vaccine. Tetanus, diphtheria, and pertussis (Tdap) booster vaccine. Your health care provider may also recommend other immunizations. Tell your health care provider if  you have ever been abused or do not feel safe at home. Summary Menopause is a normal process in which your ability to get pregnant comes to an end. This condition causes hot flashes, night sweats, decreased interest in sex, mood swings, headaches, or lack of sleep. Treatment for this condition may include hormone replacement therapy. Take actions to keep yourself healthy, including exercising regularly, eating a healthy diet, watching your weight, and checking your blood pressure and blood sugar levels. Get screened for cancer and depression. Make sure that you are up to date with all your vaccines. This information is not intended to replace advice given to you by your health care provider. Make sure you discuss any questions you have with your health care provider. Document Revised: 04/24/2021 Document Reviewed: 04/24/2021 Elsevier Patient Education  2022 [employer].

## 2022-01-16 NOTE — Assessment & Plan Note (Signed)
Has been able to wean off prozac this month, doing well. Will stay off at this time.

## 2022-02-07 ENCOUNTER — Other Ambulatory Visit: Payer: Self-pay

## 2022-02-07 DIAGNOSIS — I8390 Asymptomatic varicose veins of unspecified lower extremity: Secondary | ICD-10-CM

## 2022-02-08 ENCOUNTER — Other Ambulatory Visit: Payer: Self-pay | Admitting: Family Medicine

## 2022-02-09 ENCOUNTER — Ambulatory Visit (HOSPITAL_COMMUNITY)
Admission: RE | Admit: 2022-02-09 | Discharge: 2022-02-09 | Disposition: A | Discharge status: Home / Self Care | Payer: BC Managed Care – PPO | Source: Ambulatory Visit | Attending: Vascular Surgery | Admitting: Vascular Surgery

## 2022-02-09 ENCOUNTER — Encounter: Payer: Self-pay | Admitting: Vascular Surgery

## 2022-02-09 ENCOUNTER — Other Ambulatory Visit: Payer: Self-pay

## 2022-02-09 ENCOUNTER — Ambulatory Visit (INDEPENDENT_AMBULATORY_CARE_PROVIDER_SITE_OTHER): Payer: BC Managed Care – PPO | Admitting: Vascular Surgery

## 2022-02-09 VITALS — BP 125/66 | HR 55 | Temp 97.6°F | Resp 20 | Ht 67.5 in | Wt 225.0 lb

## 2022-02-09 DIAGNOSIS — I872 Venous insufficiency (chronic) (peripheral): Secondary | ICD-10-CM | POA: Diagnosis not present

## 2022-02-09 DIAGNOSIS — I8390 Asymptomatic varicose veins of unspecified lower extremity: Secondary | ICD-10-CM

## 2022-02-10 NOTE — Progress Notes (Signed)
Office Note     CC: Bilateral lower extremity telangiectasias Requesting Provider:  Eustaquio Boyden, MD  HPI: Katherine Steele is a 51 y.o. ([birth year]) female who presents at the request of Eustaquio Boyden, MD for evaluation of chronic venous insufficiency, with visible bilateral lower extremity telangiectasias.  On exam, Katherine Steele was doing well.  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  She has a 53 year old son who is working full-time, trying to figure out what occupation he would like to pursue.  Katherine Steele has appreciated bilateral lower extremity edema that is increased over the years.  She denies heaviness, pain in the lower extremities, but has appreciated more telangiectasias over the last few years.  She does not like the way things look good would like to have them removed.  She is also appreciated small varicose veins bilaterally.  These do not burn, itch, and have a history of bleeding or ulceration. Katherine Steele has no history of DVT, no history of venous surgery.  She has not tried compression stockings as she is asymptomatic.    The pt  on a statin for cholesterol management.  The pt not on a daily aspirin.   Other AC:  - The pt not on meds for hypertension.   The pt not diabetic.   Tobacco hx:  former  Past Medical History:  Diagnosis Date   BMI 33.0-33.9,adult    Endometriosis    s/p hysterectomy   GAD (generalized anxiety disorder)    Dr. Lucianne Muss psychiatrist   Gender dysphoria 2014   did undergo 10 mo testosterone treatment   GERD (gastroesophageal reflux disease)    HLD (hyperlipidemia) 08/12/2014   Mild off meds    MDD (major depressive disorder)    Dr. Lucianne Muss psychiatrist   PONV (postoperative nausea and vomiting)    after finger surgery    Past Surgical History:  Procedure Laterality Date   CESAREAN SECTION     COLONOSCOPY  2007   WNL [facility])   COLONOSCOPY WITH PROPOFOL N/A 03/31/2021    WNL, rpt 10 yrs [name], [place] B, MD)   ESOPHAGOGASTRODUODENOSCOPY (EGD) WITH PROPOFOL N/A 03/21/2018   barrett's without dysplasia, mild chronic gastritis, reflux gastroesophagitis [name], [name], MD)   ESOPHAGOGASTRODUODENOSCOPY (EGD) WITH PROPOFOL N/A 03/31/2021   small HH, shatski ring, ?barretts - biopsies WNL [name], Michel [place]ickers B, MD)   FINGER SURGERY Right 2007   little finger   OOPHORECTOMY Left 2006   TONSILLECTOMY  1975   TOTAL ABDOMINAL HYSTERECTOMY  12/2012   endometriosis and ovarian cysts s/p ovaries removed   TYMPANOSTOMY TUBE PLACEMENT Bilateral 1976    Social History   Socioeconomic History   Marital status: Married    Spouse name: Not on file   Number of children: Not on file   Years of education: Not on file   Highest education level: Not on file  Occupational History   Not on file  Tobacco Use   Smoking status: Former    Types: Cigarettes    Quit date: 02/06/1993    Years since quitting: 29.0   Smokeless tobacco: Never  Vaping Use   Vaping Use: Never used  Substance and Sexual Activity   Alcohol use: Yes    Comment: social/weekends   Drug use: No   Sexual activity: Yes  Other Topics Concern   Not on file  Social History Narrative   Lives with husband [name]) and son [name])   Occupation:  electronic technician   Edu: Associate's degree   Activity: exercise 3x/wk   Diet: good water, fruits/vegetables some   Social Determinants of Health   Financial Resource Strain: Not on file  Food Insecurity: Not on file  Transportation Needs: Not on file  Physical Activity: Not on file  Stress: Not on file  Social Connections: Not on file  Intimate Partner Violence: Not on file   Family History  Problem Relation Age of Onset   Diabetes Mother    Cancer Mother 64       ovarian with mets   Rheum arthritis Mother    Hypertension Father    Alcohol abuse Father        history   Autism spectrum disorder Son    CAD Maternal Grandfather 76        MI   CAD Maternal Uncle 77       MI   Rheum arthritis Maternal Aunt    Stroke Neg Hx     Current Outpatient Medications  Medication Sig Dispense Refill   Multiple Vitamin (MULTIVITAMIN) tablet Take 1 tablet by mouth daily.     omeprazole (PRILOSEC) 40 MG capsule Take 1 capsule (40 mg total) by mouth daily. 93 capsule 3   rosuvastatin (CRESTOR) 5 MG tablet Take 1 tablet (5 mg total) by mouth daily. 93 tablet 3   No current facility-administered medications for this visit.    No Known Allergies   REVIEW OF SYSTEMS:    denotes positive finding,  denotes negative finding Cardiac  Comments:  Chest pain or chest pressure:    Shortness of breath upon exertion:    Short of breath when lying flat:    Irregular heart rhythm:        Vascular    Pain in calf, thigh, or hip brought on by ambulation:    Pain in feet at night that wakes you up from your sleep:     Blood clot in your veins:    Leg swelling:         Pulmonary    Oxygen at home:    Productive cough:     Wheezing:         Neurologic    Sudden weakness in arms or legs:     Sudden numbness in arms or legs:     Sudden onset of difficulty speaking or slurred speech:    Temporary loss of vision in one eye:     Problems with dizziness:         Gastrointestinal    Blood in stool:     Vomited blood:         Genitourinary    Burning when urinating:     Blood in urine:        Psychiatric    Major depression:         Hematologic    Bleeding problems:    Problems with blood clotting too easily:        Skin    Rashes or ulcers:        Constitutional    Fever or chills:      PHYSICAL EXAMINATION:  Vitals:   02/09/22 1545  BP: 125/66  Pulse: (!) 55  Resp: 20  Temp: 97.6 F (36.4 C)  SpO2: 97%  Weight: 225 lb (102.1 kg)  Height: 5' 7.5" (1.715 m)    General:  WDWN in NAD; vital signs documented above Gait: Not observed HENT: WNL, normocephalic Pulmonary: normal non-labored breathing ,  without Rales, rhonchi,  wheezing Cardiac: regular HR,  Abdomen: soft, NT, no masses Skin: without rashes Vascular Exam/Pulses:  Right Left  Radial 2+ (normal) 2+ (normal)  Ulnar 2+ (normal) 2+ (normal)  Femoral    Popliteal    DP 2+ (normal) 2+ (normal)  PT 2+ (normal) 2+ (normal)   Extremities: without ischemic changes, without Gangrene , without cellulitis; without open wounds;  Musculoskeletal: no muscle wasting or atrophy  Neurologic: A&O X 3;  No focal weakness or paresthesias are detected Psychiatric:  The pt has Normal affect.   Non-Invasive Vascular Imaging:   Summary:  Right:  - No evidence of deep vein thrombosis seen in the right lower extremity,  from the common femoral through the popliteal veins.  - No evidence of superficial venous thrombosis in the right lower  extremity.  - No evidence of deep vein reflux.  - Superficial vein reflux in the mid SSV and in the GSV.      Left:  - No evidence of deep vein thrombosis seen in the left lower extremity,  from the common femoral through the popliteal veins.  - No evidence of superficial venous thrombosis in the left lower  extremity.  - Deep vein reflux in the CFV.  - Superficial vein reflux in the SFJ and GSV.     ASSESSMENT/PLAN:: 51 y.o. female presenting with telangiectasias in bilateral lower extremities.   Venous duplex ultrasonography demonstrated superficial venous reflux in the short saphenous and greater saphenous veins in the right leg with deep venous reflux at the common femoral vein and superficial reflux in the greater saphenous vein in the left leg.   Katherine Steele has bilateral lower extremity venous insufficiency.  During Katherine Steele's visit, we discussed the benefit of compression stockings.  Being that Katherine Steele is asymptomatic, compression stockings would help fight symptoms that can develop due to chronic venous insufficiency.  She is not interested in wearing them at this time.  I discussed the role of  greater saphenous vein ablation should her legs become symptomatic.  Katherine Steele was most interested in pursuing sclerotherapy for the telangiectasias.  We discussed that with her level of venous reflux, telangiectasias would likely reappear in different areas over time until the greater saphenous vein is addressed.  I will give her referral to sclerotherapy within our office.  I asked that she return to my clinic should any new symptoms arise.  Victorino SparrowJoshua E Preslea Rhodus, MD [facility] [phone]

## 2022-02-12 ENCOUNTER — Telehealth: Payer: Self-pay | Admitting: *Deleted

## 2022-02-12 NOTE — Telephone Encounter (Signed)
Returning telephone voice message from patient inquiring the cost of endovenous laser ablation procedure. Katherine Steele was seen by Dr. Sherral Hammers on 02-09-2022 and was referred to Cherene Julian RN for sclerotherapy. Reviewed Katherine Steele's office visit notes with Dr. Sherral Hammers and her venous reflux study.  Explained to Katherine Steele that since she was asymptomatic ( no leg pain, no skin discoloration, no limitations in her ADLs or ability to exercise, no history of bleeding varicosities and no skin ulcerations) that insurance would consider laser ablation to be cosmetic.  Endovenous laser ablation (without symptoms) would be deemed not medically necessary and would not be covered by insurance.  The out of pocket expense for endovenous laser ablation is $4750 per leg.  Katherine Steele verbalized understanding.  Katherine Steele states she has made sclerotherapy appointments with Cherene Julian RN for injections of her spider veins and that she will continue with that mode of care.

## 2022-02-20 ENCOUNTER — Other Ambulatory Visit: Payer: Self-pay

## 2022-02-20 ENCOUNTER — Ambulatory Visit (INDEPENDENT_AMBULATORY_CARE_PROVIDER_SITE_OTHER): Payer: Self-pay

## 2022-02-20 DIAGNOSIS — I8393 Asymptomatic varicose veins of bilateral lower extremities: Secondary | ICD-10-CM

## 2022-02-20 NOTE — Progress Notes (Signed)
Treated pt's BLE spider veins and small reticular veins with Asclera 1% administered with a 27g butterfly.  Patient received a total of 6 mL. Pt tolerated well. Easy access. She will need more treatments likely, which we discussed. She was given post treatment care instructions both verbally and on handout. Will follow prn. ? ?Photos: Yes.    Compression stockings applied: Yes.   ? ? ? ? ? ? ? ? ? ? ? ?

## 2022-03-09 ENCOUNTER — Ambulatory Visit: Payer: BC Managed Care – PPO

## 2022-05-01 DIAGNOSIS — F411 Generalized anxiety disorder: Secondary | ICD-10-CM | POA: Diagnosis not present

## 2022-05-01 DIAGNOSIS — F331 Major depressive disorder, recurrent, moderate: Secondary | ICD-10-CM | POA: Diagnosis not present

## 2022-05-29 DIAGNOSIS — F411 Generalized anxiety disorder: Secondary | ICD-10-CM | POA: Diagnosis not present

## 2022-05-29 DIAGNOSIS — F331 Major depressive disorder, recurrent, moderate: Secondary | ICD-10-CM | POA: Diagnosis not present

## 2022-08-02 ENCOUNTER — Encounter: Payer: Self-pay | Admitting: Family Medicine

## 2022-08-03 MED ORDER — FLUOXETINE HCL 40 MG PO CAPS: 40.0000 mg | ORAL_CAPSULE | Freq: Every day | ORAL | 2 refills | Status: DC

## 2022-09-25 ENCOUNTER — Encounter: Payer: Self-pay | Admitting: Family Medicine

## 2022-09-25 DIAGNOSIS — F331 Major depressive disorder, recurrent, moderate: Secondary | ICD-10-CM

## 2022-09-25 DIAGNOSIS — G4700 Insomnia, unspecified: Secondary | ICD-10-CM

## 2022-09-25 DIAGNOSIS — F411 Generalized anxiety disorder: Secondary | ICD-10-CM

## 2022-09-26 ENCOUNTER — Encounter: Payer: Self-pay | Admitting: Family Medicine

## 2022-09-26 DIAGNOSIS — F411 Generalized anxiety disorder: Secondary | ICD-10-CM | POA: Diagnosis not present

## 2022-09-26 DIAGNOSIS — F331 Major depressive disorder, recurrent, moderate: Secondary | ICD-10-CM | POA: Diagnosis not present

## 2022-09-26 NOTE — Telephone Encounter (Signed)
Last office visit 01/16/22  Medication is not on the medication list

## 2022-09-28 ENCOUNTER — Encounter: Payer: Self-pay | Admitting: Family Medicine

## 2022-09-28 ENCOUNTER — Telehealth (INDEPENDENT_AMBULATORY_CARE_PROVIDER_SITE_OTHER): Payer: BC Managed Care – PPO | Admitting: Family Medicine

## 2022-09-28 VITALS — Ht 65.75 in

## 2022-09-28 DIAGNOSIS — F331 Major depressive disorder, recurrent, moderate: Secondary | ICD-10-CM

## 2022-09-28 DIAGNOSIS — F410 Panic disorder [episodic paroxysmal anxiety] without agoraphobia: Secondary | ICD-10-CM | POA: Diagnosis not present

## 2022-09-28 DIAGNOSIS — F411 Generalized anxiety disorder: Secondary | ICD-10-CM | POA: Diagnosis not present

## 2022-09-28 MED ORDER — ALPRAZOLAM 0.25 MG PO TABS: 0.2500 mg | ORAL_TABLET | Freq: Two times a day (BID) | ORAL | 0 refills | Status: DC | PRN

## 2022-09-28 NOTE — Assessment & Plan Note (Signed)
See above

## 2022-09-28 NOTE — Telephone Encounter (Signed)
Please schedule OV. May do virtual if pt prefers at end of a day if more convenient.

## 2022-09-28 NOTE — Assessment & Plan Note (Addendum)
Worsened, with anxiety attacks.  This is despite prozac 40mg  daily. Will Rx xanax PRN while she gets in with psychiatrist.  Reviewed risks of controlled substance including habit forming nature, addition, abuse potential and dependence/tolerance potential.

## 2022-09-28 NOTE — Telephone Encounter (Signed)
Spoke with pt offering a virtual visit today at 4:00.  Pt accepted appt.  However, she is currently driving but should be available about 4:10.  She has no way to check vitals and not sure of current wt.   Will have pt added to to schedule today at 4:00.

## 2022-09-28 NOTE — Progress Notes (Signed)
Patient ID: Katherine Steele, female    DOB: 04-14-1971, 51 y.o.   MRN: [id number]  Virtual visit completed through [place], a video enabled telemedicine application. Due to national recommendations of social distancing due to COVID-19, a virtual visit is felt to be most appropriate for this patient at this time. Reviewed limitations, risks, security and privacy concerns of performing a virtual visit and the availability of in person appointments. I also reviewed that there may be a patient responsible charge related to this service. The patient agreed to proceed.   Patient location: home Provider location: [place] [facility], office Persons participating in this virtual visit: patient, provider   If any vitals were documented, they were collected by patient at home unless specified below.    Ht 5' 5.75" (1.67 m)   LMP 07/17/2012   BMI 36.59 kg/m    CC: depression Subjective:   HPI: Katherine Steele is a 51 y.o. female presenting on 09/28/2022 for Depression   H/o GAD, MDD, gender dysphoria. Previously on Prozac tablets with good effect (capsules caused pill dysphagia/worsened GERD), was able to wean off 2019 and 2020. Did not tolerate wellbutrin, zoloft, paxil previously. Was also previously on abilify. Has taken and tolerated xanax well in the past.  When she takes prilosec + capsules - she does better regarding GERD symptoms. Insurance stopped covering tablets.   4-5 months ago she restarted prozac capsules 40mg  daily due to increased stress over world events.  She had anxiety attack and had to leave work on Wednesday due to this - heart racing, short of breath, arms felt numb.  Focusing on negative thoughts.  No HI. + passive SI but no plan - today has actually started feeling better.   She's started seeing counselor Ignacia Palma with [facility]).  She does have psychiatry appt planned for Dec 2023.   Upcoming vacation planned next week.      Relevant  past medical, surgical, family and social history reviewed and updated as indicated. Interim medical history since our last visit reviewed. Allergies and medications reviewed and updated. Outpatient Medications Prior to Visit  Medication Sig Dispense Refill   FLUoxetine (PROZAC) 40 MG capsule Take 1 capsule (40 mg total) by mouth daily. 90 capsule 2   Multiple Vitamin (MULTIVITAMIN) tablet Take 1 tablet by mouth daily.     omeprazole (PRILOSEC) 40 MG capsule Take 1 capsule (40 mg total) by mouth daily. 93 capsule 3   rosuvastatin (CRESTOR) 5 MG tablet Take 1 tablet (5 mg total) by mouth daily. 93 tablet 3   No facility-administered medications prior to visit.     Per HPI unless specifically indicated in ROS section below Review of Systems Objective:  Ht 5' 5.75" (1.67 m)   LMP 07/17/2012   BMI 36.59 kg/m   Wt Readings from Last 3 Encounters:  02/09/22 225 lb (102.1 kg)  01/16/22 222 lb 3 oz (100.8 kg)  03/31/21 220 lb (99.8 kg)       Physical exam: Gen: alert, NAD, not ill appearing Pulm: speaks in complete sentences without increased work of breathing Psych: normal mood, normal thought content      Results for orders placed or performed in visit on 12/29/21  Comprehensive metabolic panel  Result Value Ref Range   Sodium 139 135 - 145 mEq/L   Potassium 4.3 3.5 - 5.1 mEq/L   Chloride 103 96 - 112 mEq/L   CO2 29 19 - 32 mEq/L   Glucose, Bld  97 70 - 99 mg/dL   BUN 12 6 - 23 mg/dL   Creatinine, Ser 9.44 0.40 - 1.20 mg/dL   Total Bilirubin 0.9 0.2 - 1.2 mg/dL   Alkaline Phosphatase 82 39 - 117 U/L   AST 24 0 - 37 U/L   ALT 37 (H) 0 - 35 U/L   Total Protein 7.0 6.0 - 8.3 g/dL   Albumin 4.4 3.5 - 5.2 g/dL   GFR 967.59 >16.38 mL/min   Calcium 9.2 8.4 - 10.5 mg/dL  Lipid panel  Result Value Ref Range   Cholesterol 209 (H) 0 - 200 mg/dL   Triglycerides 46.6 0.0 - 149.0 mg/dL   HDL 59.93 >57.01 mg/dL   VLDL 77.9 0.0 - 39.0 mg/dL   LDL Cholesterol 300 (H) 0 - 99 mg/dL    Total CHOL/HDL Ratio 3    NonHDL 143.75    Assessment & Plan:   Problem List Items Addressed This Visit     Generalized anxiety disorder    Worsened, with anxiety attacks.  This is despite prozac 40mg  daily. Will Rx xanax PRN while she gets in with psychiatrist.  Reviewed risks of controlled substance including habit forming nature, addition, abuse potential and dependence/tolerance potential.       Relevant Medications   ALPRAZolam (XANAX) 0.25 MG tablet   MDD (major depressive disorder) - Primary    Recurrent MDD that started several months ago in setting of concerning world events. Affecting job. She is already on prozac 40mg  tablets (+ PPI). Will add xanax PRN anxiety attack, could consider adjuvant abilify vs switch to SNRI. Previously poor response to other SSRIs and wellbutrin. She is already established with counselor, she has upcoming psychiatry appointment in 2 months.  Suicide precautions discussed.       Relevant Medications   ALPRAZolam (XANAX) 0.25 MG tablet   Anxiety attack    See above.       Relevant Medications   ALPRAZolam (XANAX) 0.25 MG tablet     Meds ordered this encounter  Medications   ALPRAZolam (XANAX) 0.25 MG tablet    Sig: Take 1-2 tablets (0.25-0.5 mg total) by mouth 2 (two) times daily as needed for anxiety.    Dispense:  30 tablet    Refill:  0   No orders of the defined types were placed in this encounter.   I discussed the assessment and treatment plan with the patient. The patient was provided an opportunity to ask questions and all were answered. The patient agreed with the plan and demonstrated an understanding of the instructions. The patient was advised to call back or seek an in-person evaluation if the symptoms worsen or if the condition fails to improve as anticipated.  Follow up plan: No follow-ups on file.  , MD

## 2022-09-28 NOTE — Assessment & Plan Note (Addendum)
Recurrent MDD that started several months ago in setting of concerning world events. Affecting job. She is already on prozac 40mg  tablets (+ PPI). Will add xanax PRN anxiety attack, could consider adjuvant abilify vs switch to SNRI. Previously poor response to other SSRIs and wellbutrin. She is already established with counselor, she has upcoming psychiatry appointment in 2 months.  Suicide precautions discussed.

## 2022-10-09 DIAGNOSIS — F331 Major depressive disorder, recurrent, moderate: Secondary | ICD-10-CM | POA: Diagnosis not present

## 2022-10-09 DIAGNOSIS — Z1231 Encounter for screening mammogram for malignant neoplasm of breast: Secondary | ICD-10-CM | POA: Diagnosis not present

## 2022-10-09 DIAGNOSIS — F411 Generalized anxiety disorder: Secondary | ICD-10-CM | POA: Diagnosis not present

## 2022-10-09 LAB — HM MAMMOGRAPHY

## 2022-10-10 ENCOUNTER — Encounter: Payer: Self-pay | Admitting: Family Medicine

## 2022-10-18 ENCOUNTER — Other Ambulatory Visit: Payer: Self-pay | Admitting: Family Medicine

## 2022-10-18 ENCOUNTER — Encounter: Payer: Self-pay | Admitting: Family Medicine

## 2022-10-18 DIAGNOSIS — K2100 Gastro-esophageal reflux disease with esophagitis, without bleeding: Secondary | ICD-10-CM

## 2022-10-18 DIAGNOSIS — E7800 Pure hypercholesterolemia, unspecified: Secondary | ICD-10-CM

## 2022-10-22 DIAGNOSIS — F411 Generalized anxiety disorder: Secondary | ICD-10-CM | POA: Diagnosis not present

## 2022-10-22 DIAGNOSIS — F331 Major depressive disorder, recurrent, moderate: Secondary | ICD-10-CM | POA: Diagnosis not present

## 2022-11-12 ENCOUNTER — Other Ambulatory Visit: Payer: Self-pay | Admitting: Family Medicine

## 2022-11-12 MED ORDER — ALPRAZOLAM 0.25 MG PO TABS: 0.2500 mg | ORAL_TABLET | Freq: Two times a day (BID) | ORAL | 0 refills | Status: DC | PRN

## 2022-11-12 NOTE — Telephone Encounter (Signed)
Last refill 09/28/2022 Last visit 09/28/2022 Next visit 12/18/2022

## 2022-11-12 NOTE — Telephone Encounter (Signed)
ERx 

## 2022-11-26 DIAGNOSIS — F411 Generalized anxiety disorder: Secondary | ICD-10-CM | POA: Diagnosis not present

## 2022-11-26 DIAGNOSIS — F331 Major depressive disorder, recurrent, moderate: Secondary | ICD-10-CM | POA: Diagnosis not present

## 2022-11-30 ENCOUNTER — Ambulatory Visit (INDEPENDENT_AMBULATORY_CARE_PROVIDER_SITE_OTHER): Payer: BC Managed Care – PPO | Admitting: Psychiatry

## 2022-11-30 ENCOUNTER — Encounter: Payer: Self-pay | Admitting: Psychiatry

## 2022-11-30 VITALS — BP 111/73 | HR 65 | Temp 98.1°F | Ht 66.0 in | Wt 206.4 lb

## 2022-11-30 DIAGNOSIS — F1290 Cannabis use, unspecified, uncomplicated: Secondary | ICD-10-CM | POA: Insufficient documentation

## 2022-11-30 DIAGNOSIS — F640 Transsexualism: Secondary | ICD-10-CM

## 2022-11-30 DIAGNOSIS — F331 Major depressive disorder, recurrent, moderate: Secondary | ICD-10-CM | POA: Diagnosis not present

## 2022-11-30 DIAGNOSIS — F419 Anxiety disorder, unspecified: Secondary | ICD-10-CM | POA: Diagnosis not present

## 2022-11-30 DIAGNOSIS — Z79899 Other long term (current) drug therapy: Secondary | ICD-10-CM | POA: Insufficient documentation

## 2022-11-30 DIAGNOSIS — F649 Gender identity disorder, unspecified: Secondary | ICD-10-CM | POA: Insufficient documentation

## 2022-11-30 MED ORDER — FLUOXETINE HCL 20 MG PO CAPS: 20.0000 mg | ORAL_CAPSULE | Freq: Every day | ORAL | 0 refills | Status: DC

## 2022-11-30 MED ORDER — PROPRANOLOL HCL 10 MG PO TABS: 10.0000 mg | ORAL_TABLET | Freq: Two times a day (BID) | ORAL | 1 refills | Status: DC | PRN

## 2022-11-30 MED ORDER — FLUOXETINE HCL 10 MG PO CAPS: 10.0000 mg | ORAL_CAPSULE | Freq: Every day | ORAL | 0 refills | Status: DC

## 2022-11-30 MED ORDER — VENLAFAXINE HCL ER 37.5 MG PO CP24: 37.5000 mg | ORAL_CAPSULE | Freq: Every day | ORAL | 1 refills | Status: DC

## 2022-11-30 NOTE — Patient Instructions (Addendum)
Please go if in a crisis. [facility] [email or web address] [place], [place] [address]  [phone]  Propranolol Tablets What is this medication? PROPRANOLOL (proe PRAN oh lole) treats many conditions such as high blood pressure, tremors, and a type of arrhythmia known as AFib (atrial fibrillation). It works by lowering your blood pressure and heart rate, making it easier for your heart to pump blood to the rest of your body. It may be used to prevent migraine headaches. It works by relaxing the blood vessels in the brain that cause migraines. It belongs to a group of medications called beta blockers. This medicine may be used for other purposes; ask your health care provider or pharmacist if you have questions. COMMON BRAND NAME(S): Inderal What should I tell my care team before I take this medication? They need to know if you have any of these conditions: Circulation problems or blood vessel disease Diabetes History of heart attack or heart disease, vasospastic angina Kidney disease Liver disease Lung or breathing disease, like asthma or emphysema Pheochromocytoma Slow heart rate Thyroid disease An unusual or allergic reaction to propranolol, other beta-blockers, medications, foods, dyes, or preservatives Pregnant or trying to get pregnant Breast-feeding How should I use this medication? Take this medication by mouth. Take it as directed on the prescription label at the same time every day. Keep taking it unless your care team tells you to stop. Talk to your care team about the use of this medication in children. Special care may be needed. Overdosage: If you think you have taken too much of this medicine contact a poison control center or emergency room at once. NOTE: This medicine is only for you. Do not share this medicine with others. What if I miss a dose? If you miss a dose, take it as soon as you can. If it is almost time for your next  dose, take only that dose. Do not take double or extra doses. What may interact with this medication? Do not take this medication with any of the following: Feverfew Phenothiazines like chlorpromazine, mesoridazine, prochlorperazine, thioridazine This medication may also interact with the following: Aluminum hydroxide gel Antipyrine Antiviral medications for HIV or AIDS Barbiturates like phenobarbital Certain medications for blood pressure, heart disease, irregular heart beat Cimetidine Ciprofloxacin Diazepam Fluconazole Haloperidol Isoniazid Medications for cholesterol like cholestyramine or colestipol Medications for mental depression Medications for migraine headache like almotriptan, eletriptan, frovatriptan, naratriptan, rizatriptan, sumatriptan, zolmitriptan NSAIDs, medications for pain and inflammation, like ibuprofen or naproxen Phenytoin Rifampin Teniposide Theophylline Thyroid medications Tolbutamide Warfarin Zileuton This list may not describe all possible interactions. Give your health care provider a list of all the medicines, herbs, non-prescription drugs, or dietary supplements you use. Also tell them if you smoke, drink alcohol, or use illegal drugs. Some items may interact with your medicine. What should I watch for while using this medication? Visit your care team for regular checks on your progress. Check your blood pressure as directed. Ask your care team what your blood pressure should be. Also, find out when you should contact him or her. Do not treat yourself for coughs, colds, or pain while you are using this medication without asking your care team for advice. Some medications may increase your blood pressure. You may get drowsy or dizzy. Do not drive, use machinery, or do anything that needs mental alertness until you know how this medication affects you. Do not stand up or sit up quickly, especially if you are an older  patient. This reduces the risk of  dizzy or fainting spells. Alcohol may interfere with the effect of this medication. Avoid alcoholic drinks. This medication may increase blood sugar. Ask your care team if changes in diet or medications are needed if you have diabetes. What side effects may I notice from receiving this medication? Side effects that you should report to your care team as soon as possible: Allergic reactions--skin rash, itching, hives, swelling of the face, lips, tongue, or throat Heart failure--shortness of breath, swelling of the ankles, feet, or hands, sudden weight gain, unusual weakness or fatigue Low blood pressure--dizziness, feeling faint or lightheaded, blurry vision Raynaud's--cool, numb, or painful fingers or toes that may change color from pale, to blue, to red Redness, blistering, peeling, or loosening of the skin, including inside the mouth Slow heartbeat--dizziness, feeling faint or lightheaded, confusion, trouble breathing, unusual weakness or fatigue Worsening mood, feelings of depression Side effects that usually do not require medical attention (report to your care team if they continue or are bothersome): Change in sex drive or performance Diarrhea Dizziness Fatigue Headache This list may not describe all possible side effects. Call your doctor for medical advice about side effects. You may report side effects to FDA at 1-800-FDA-1088. Where should I keep my medication? Keep out of the reach of children and pets. Store at room temperature between 20 and 25 degrees C (68 and 77 degrees F). Protect from light. Throw away any unused medication after the expiration date. NOTE: This sheet is a summary. It may not cover all possible information. If you have questions about this medicine, talk to your doctor, pharmacist, or health care provider.  2023 Elsevier/Gold Standard (2021-03-12 00:00:00) Venlafaxine Extended-Release Capsules What is this medication? VENLAFAXINE (VEN la fax een) treats  depression and anxiety. It increases the amount of serotonin and norepinephrine in the brain, hormones that help regulate mood. It belongs to a group of medications called SNRIs. This medicine may be used for other purposes; ask your health care provider or pharmacist if you have questions. COMMON BRAND NAME(S): Effexor XR What should I tell my care team before I take this medication? They need to know if you have any of these conditions: Bleeding disorders Glaucoma Heart disease High blood pressure High cholesterol Kidney disease Liver disease Low levels of sodium in the blood Mania or bipolar disorder Seizures Suicidal thoughts, plans, or attempt; a previous suicide attempt by you or a family Take medications that treat or prevent blood clots Thyroid disease An unusual or allergic reaction to venlafaxine, desvenlafaxine, other medications, foods, dyes, or preservatives Pregnant or trying to get pregnant Breast-feeding How should I use this medication? Take this medication by mouth with a full glass of water. Follow the directions on the prescription label. Do not cut, crush, or chew this medication. Take it with food. If needed, the capsule may be carefully opened and the entire contents sprinkled on a spoonful of cool applesauce. Swallow the applesauce/pellet mixture right away without chewing and follow with a glass of water to ensure complete swallowing of the pellets. Try to take your medication at about the same time each day. Do not take your medication more often than directed. Do not stop taking this medication suddenly except upon the advice of your care team. Stopping this medication too quickly may cause serious side effects or your condition may worsen. A special MedGuide will be given to you by the pharmacist with each prescription and refill. Be sure to read this information  carefully each time. Talk to your care team regarding the use of this medication in children. Special  care may be needed. Overdosage: If you think you have taken too much of this medicine contact a poison control center or emergency room at once. NOTE: This medicine is only for you. Do not share this medicine with others. What if I miss a dose? If you miss a dose, take it as soon as you can. If it is almost time for your next dose, take only that dose. Do not take double or extra doses. What may interact with this medication? Do not take this medication with any of the following: Certain medications for fungal infections like fluconazole, itraconazole, ketoconazole, posaconazole, voriconazole Cisapride Desvenlafaxine Dronedarone Duloxetine Levomilnacipran Linezolid MAOIs like Carbex, Eldepryl, Marplan, Nardil, and Parnate Methylene blue (injected into a vein) Milnacipran Pimozide Thioridazine This medication may also interact with the following: Amphetamines Aspirin and aspirin-like medications Certain medications for depression, anxiety, or psychotic disturbances Certain medications for migraine headaches like almotriptan, eletriptan, frovatriptan, naratriptan, rizatriptan, sumatriptan, zolmitriptan Certain medications for sleep Certain medications that treat or prevent blood clots like dalteparin, enoxaparin, warfarin Cimetidine Clozapine Diuretics Fentanyl Furazolidone Indinavir Isoniazid Lithium Metoprolol NSAIDS, medications for pain and inflammation, like ibuprofen or naproxen Other medications that prolong the QT interval (cause an abnormal heart rhythm) like dofetilide, ziprasidone Procarbazine Rasagiline Supplements like St. John's wort, kava kava, valerian Tramadol Tryptophan This list may not describe all possible interactions. Give your health care provider a list of all the medicines, herbs, non-prescription drugs, or dietary supplements you use. Also tell them if you smoke, drink alcohol, or use illegal drugs. Some items may interact with your medicine. What  should I watch for while using this medication? Tell your care team if your symptoms do not get better or if they get worse. Visit your care team for regular checks on your progress. Because it may take several weeks to see the full effects of this medication, it is important to continue your treatment as prescribed by your care team. Watch for new or worsening thoughts of suicide or depression. This includes sudden changes in mood, behaviors, or thoughts. These changes can happen at any time but are more common in the beginning of treatment or after a change in dose. Call your care team right away if you experience these thoughts or worsening depression. Manic episodes may happen in patients with bipolar disorder who take this medication. Watch for changes in feelings or behaviors such as feeling anxious, nervous, agitated, panicky, irritable, hostile, aggressive, impulsive, severely restless, overly excited and hyperactive, or trouble sleeping. These changes can happen at any time but are more common in the beginning of treatment or after a change in dose. Call your care team right away if you notice any of these symptoms. This medication can cause an increase in blood pressure. Check with your care team for instructions on monitoring your blood pressure while taking this medication. You may get drowsy or dizzy. Do not drive, use machinery, or do anything that needs mental alertness until you know how this medication affects you. Do not stand or sit up quickly, especially if you are an older patient. This reduces the risk of dizzy or fainting spells. Do not drink alcohol while taking this medication. Drinking alcohol may alter the effects of your medication. Serious side effects may occur. Your mouth may get dry. Chewing sugarless gum, sucking hard candy and drinking plenty of water will help. Contact your care team if  the problem does not go away or is severe. What side effects may I notice from receiving  this medication? Side effects that you should report to your care team as soon as possible: Allergic reactions--skin rash, itching, hives, swelling of the face, lips, tongue, or throat Bleeding--bloody or black, tar-like stools, red or dark brown urine, vomiting blood or brown material that looks like coffee grounds, small, red or purple spots on skin, unusual bleeding or bruising Heart rhythm changes--fast or irregular heartbeat, dizziness, feeling faint or lightheaded, chest pain, trouble breathing Increase in blood pressure Loss of appetite with weight loss Low sodium level--muscle weakness, fatigue, dizziness, headache, confusion Serotonin syndrome--irritability, confusion, fast or irregular heartbeat, muscle stiffness, twitching muscles, sweating, high fever, seizures, chills, vomiting, diarrhea Sudden eye pain or change in vision such as blurry vision, seeing halos around lights, vision loss Thoughts of suicide or self-harm, worsening mood, feelings of depression Side effects that usually do not require medical attention (report to your care team if they continue or are bothersome): Anxiety, nervousness Change in sex drive or performance Dizziness Dry mouth Excessive sweating Nausea Tremors or shaking Trouble sleeping This list may not describe all possible side effects. Call your doctor for medical advice about side effects. You may report side effects to FDA at 1-800-FDA-1088. Where should I keep my medication? Keep out of the reach of children and pets. Store at a controlled temperature between 20 and 25 degrees C (68 degrees and 77 degrees F), in a dry place. Throw away any unused medication after the expiration date. NOTE: This sheet is a summary. It may not cover all possible information. If you have questions about this medicine, talk to your doctor, pharmacist, or health care provider.  2023 Elsevier/Gold Standard (2008-01-24 00:00:00)
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
## 2022-12-20 ENCOUNTER — Other Ambulatory Visit
Admission: RE | Admit: 2022-12-20 | Discharge: 2022-12-20 | Disposition: A | Discharge status: Home / Self Care | Payer: BLUE CROSS/BLUE SHIELD | Attending: Psychiatry | Admitting: Psychiatry

## 2022-12-20 ENCOUNTER — Other Ambulatory Visit: Payer: Self-pay | Admitting: Psychiatry

## 2022-12-20 DIAGNOSIS — F331 Major depressive disorder, recurrent, moderate: Secondary | ICD-10-CM | POA: Insufficient documentation

## 2022-12-20 DIAGNOSIS — F1290 Cannabis use, unspecified, uncomplicated: Secondary | ICD-10-CM | POA: Insufficient documentation

## 2022-12-20 DIAGNOSIS — F419 Anxiety disorder, unspecified: Secondary | ICD-10-CM

## 2022-12-20 DIAGNOSIS — Z79899 Other long term (current) drug therapy: Secondary | ICD-10-CM

## 2022-12-20 LAB — SODIUM: Sodium: 138 mmol/L (ref 135–145)

## 2022-12-20 LAB — TSH: TSH: 1.793 u[IU]/mL (ref 0.350–4.500)

## 2022-12-21 LAB — URINE DRUGS OF ABUSE SCREEN W ALC, ROUTINE (REF LAB)
Amphetamines, Urine: NEGATIVE ng/mL
Barbiturate, Ur: NEGATIVE ng/mL
Benzodiazepine Quant, Ur: NEGATIVE ng/mL
Cannabinoid Quant, Ur: NEGATIVE ng/mL
Cocaine (Metab.): NEGATIVE ng/mL
Ethanol U, Quan: NEGATIVE %
Methadone Screen, Urine: NEGATIVE ng/mL
Opiate Quant, Ur: NEGATIVE ng/mL
Phencyclidine, Ur: NEGATIVE ng/mL
Propoxyphene, Urine: NEGATIVE ng/mL

## 2022-12-24 ENCOUNTER — Telehealth: Payer: Self-pay

## 2022-12-24 ENCOUNTER — Other Ambulatory Visit: Payer: Self-pay | Admitting: Psychiatry

## 2022-12-24 DIAGNOSIS — F419 Anxiety disorder, unspecified: Secondary | ICD-10-CM

## 2022-12-24 DIAGNOSIS — F331 Major depressive disorder, recurrent, moderate: Secondary | ICD-10-CM

## 2022-12-24 NOTE — Telephone Encounter (Signed)
left message to call office back with the name or medication needed and the pharmacy to send rx too.

## 2022-12-24 NOTE — Telephone Encounter (Signed)
I have sent a message about this in another phone note.

## 2022-12-24 NOTE — Telephone Encounter (Signed)
pt called left message that she needed a refill on her medication. she did not state what medication or which pharmacy.

## 2022-12-24 NOTE — Telephone Encounter (Signed)
Two of her medications, propranolol and venlafaxine were approved today.

## 2022-12-25 MED ORDER — VENLAFAXINE HCL ER 75 MG PO CP24: 75.0000 mg | ORAL_CAPSULE | Freq: Every day | ORAL | 0 refills | Status: DC

## 2022-12-25 NOTE — Telephone Encounter (Signed)
left message. pt notified.  

## 2022-12-25 NOTE — Telephone Encounter (Signed)
Pt was left a message that medication was sent to pharmacy

## 2022-12-25 NOTE — Telephone Encounter (Signed)
I can send a new prescription with dose increase of venlafaxine extended release to 75 mg p.o. daily since patient has been doubling up the dosage.  Will have Janett Billow CMA contact patient to discuss this as well as educate patient not to increase the dosage of medication without consulting the provider.

## 2023-01-03 ENCOUNTER — Other Ambulatory Visit: Payer: Self-pay | Admitting: Family Medicine

## 2023-01-03 DIAGNOSIS — E7800 Pure hypercholesterolemia, unspecified: Secondary | ICD-10-CM

## 2023-01-03 DIAGNOSIS — K2100 Gastro-esophageal reflux disease with esophagitis, without bleeding: Secondary | ICD-10-CM

## 2023-01-16 ENCOUNTER — Encounter: Payer: Self-pay | Admitting: Family Medicine

## 2023-01-16 ENCOUNTER — Ambulatory Visit (INDEPENDENT_AMBULATORY_CARE_PROVIDER_SITE_OTHER): Payer: BC Managed Care – PPO | Admitting: Family Medicine

## 2023-01-16 VITALS — BP 118/66 | HR 55 | Temp 97.2°F | Ht 67.0 in | Wt 204.0 lb

## 2023-01-16 DIAGNOSIS — E7800 Pure hypercholesterolemia, unspecified: Secondary | ICD-10-CM

## 2023-01-16 DIAGNOSIS — K2100 Gastro-esophageal reflux disease with esophagitis, without bleeding: Secondary | ICD-10-CM | POA: Diagnosis not present

## 2023-01-16 DIAGNOSIS — F331 Major depressive disorder, recurrent, moderate: Secondary | ICD-10-CM

## 2023-01-16 DIAGNOSIS — E669 Obesity, unspecified: Secondary | ICD-10-CM

## 2023-01-16 DIAGNOSIS — Z0000 Encounter for general adult medical examination without abnormal findings: Secondary | ICD-10-CM

## 2023-01-16 MED ORDER — OMEPRAZOLE 40 MG PO CPDR: 40.0000 mg | DELAYED_RELEASE_CAPSULE | Freq: Every day | ORAL | 3 refills | Status: DC

## 2023-01-16 MED ORDER — ROSUVASTATIN CALCIUM 5 MG PO TABS: 5.0000 mg | ORAL_TABLET | Freq: Every day | ORAL | 3 refills | Status: DC

## 2023-01-16 NOTE — Patient Instructions (Addendum)
Labs today Continue walking - congrats on weight loss!  Good to see you today  Return as needed or in 1 year for next physical.

## 2023-01-16 NOTE — Assessment & Plan Note (Signed)
Congratulated on 20 lb weight loss. Encouraged ongoing efforts towards healthy diet and lifestyle choices to affect sustainable weight loss.

## 2023-01-16 NOTE — Assessment & Plan Note (Addendum)
Stable period on omeprazole 40mg  daily - continue this.

## 2023-01-16 NOTE — Assessment & Plan Note (Addendum)
Established with psych, appreciate their care - recent med changes reviewed with patient.  Ongoing work and current events-related stressors.  She notes ongoing SI without plan, contracts for safety, has suicide hotline # accessible, encouraged she undergo intensive outpatient psych program.

## 2023-01-16 NOTE — Progress Notes (Signed)
Patient ID: Katherine Steele, female    DOB: 1971/06/13, 52 y.o.   MRN: [id number]  This visit was conducted in person.  BP 118/66   Pulse (!) 55   Temp (!) 97.2 F (36.2 C) (Temporal)   Ht 5\' 7"  (1.702 m)   Wt 204 lb (92.5 kg)   LMP 07/17/2012   SpO2 98%   BMI 31.95 kg/m   BP Readings from Last 3 Encounters:  01/16/23 118/66  11/30/22 111/73  02/09/22 125/66    Pulse Readings from Last 3 Encounters:  01/16/23 (!) 55  11/30/22 65  02/09/22 (!) 55    CC: CPE Subjective:   HPI: Katherine Steele is a 52 y.o. female presenting on 01/16/2023 for Annual Exam   Prozac stopped last year, restarted summer 2023. Established with counselor and psychiatrist - prozac changed to Effexor, started on propranolol PRN anxiety in place of xanax. Depression still present, anxiety symptoms are better.    Barrett's esophagus without dysplasia by biopsy on EGD 03/2018 - doing well on omeprazole 20mg  daily. Dysphagia resolved after dilation of schatzki's ring. Continues daily PPI. Biopsy 03/2021 EGD - normal   Preventative: ESOPHAGOGASTRODUODENOSCOPY (EGD) WITH PROPOFOL 03/31/2021 - small HH, shatski ring, ?barretts - biopsies WNL 04/2021, [name], MD) COLONOSCOPY WITH PROPOFOL 03/31/2021 - WNL, rpt 10 yrs (Tahiliani, [name], MD) Well woman - previously saw OBGYN Dr. 04/02/2021 last seen yrs ago. S/p total hysterectomy with bilateral oophorectomy for endometriosis with dysmenorrhea (2014). No hormone replacement afterwards.  Mammogram - Birads1 09/2022 @ [facility]  Lung cancer screening - not eligible Flu shot yearly  COVID vaccine Moderna 02/2020, 03/2020, no booster Tdap 08/2005. Td 2016  Shingrix - discussed, declines as father had bad reaction to this vaccine  Seat belt use discussed.  Sunscreen use discussed, no changing moles on skin.  Sleep - averages 7 hours/night Non smoker, husband smokes outdoors  Alcohol - rare  Dentist q6 mo  Eye exam yearly    Lives with husband 09/2005)  and son 2017)  Occupation: [name]  Edu: Associate's degree  Activity: exercise walking daily at work  Diet: good water, fruits/vegetables some      Relevant past medical, surgical, family and social history reviewed and updated as indicated. Interim medical history since our last visit reviewed. Allergies and medications reviewed and updated. Outpatient Medications Prior to Visit  Medication Sig Dispense Refill   propranolol (INDERAL) 10 MG tablet TAKE 1 TABLET (10 MG TOTAL) BY MOUTH 2 (TWO) TIMES DAILY AS NEEDED. FOR SEVERE ANXIETY ATTACKS ONLY 180 tablet 0   venlafaxine XR (EFFEXOR XR) 75 MG 24 hr capsule Take 1 capsule (75 mg total) by mouth daily with breakfast. 30 capsule 0   omeprazole (PRILOSEC) 40 MG capsule TAKE 1 CAPSULE BY MOUTH DAILY 90 capsule 0   rosuvastatin (CRESTOR) 5 MG tablet TAKE 1 TABLET BY MOUTH DAILY 90 tablet 0   ALPRAZolam (XANAX) 0.25 MG tablet Take 1-2 tablets (0.25-0.5 mg total) by mouth 2 (two) times daily as needed for anxiety. 30 tablet 0   FLUoxetine (PROZAC) 10 MG capsule Take 1 capsule (10 mg total) by mouth daily for 28 days. Start taking after stopping Fluoxetine 20 mg 28 capsule 0   FLUoxetine (PROZAC) 20 MG capsule Take 1 capsule (20 mg total) by mouth daily for 14 days. 14 capsule 0   No facility-administered medications prior to visit.     Per HPI unless specifically indicated in ROS section below Review of  Systems  Constitutional:  Negative for activity change, appetite change, chills, fatigue, fever and unexpected weight change.  HENT:  Negative for hearing loss.   Eyes:  Negative for visual disturbance.  Respiratory:  Negative for cough, chest tightness, shortness of breath and wheezing.   Cardiovascular:  Negative for chest pain, palpitations and leg swelling.  Gastrointestinal:  Negative for abdominal distention, abdominal pain, blood in stool, constipation, diarrhea, nausea and vomiting.  Genitourinary:  Negative for  difficulty urinating and hematuria.  Musculoskeletal:  Negative for arthralgias, myalgias and neck pain.  Skin:  Negative for rash.  Neurological:  Negative for dizziness, seizures, syncope and headaches.  Hematological:  Negative for adenopathy. Does not bruise/bleed easily.  Psychiatric/Behavioral:  Negative for dysphoric mood. The patient is not nervous/anxious.     Objective:  BP 118/66   Pulse (!) 55   Temp (!) 97.2 F (36.2 C) (Temporal)   Ht 5\' 7"  (1.702 m)   Wt 204 lb (92.5 kg)   LMP 07/17/2012   SpO2 98%   BMI 31.95 kg/m   Wt Readings from Last 3 Encounters:  01/16/23 204 lb (92.5 kg)  11/30/22 206 lb 6.4 oz (93.6 kg)  02/09/22 225 lb (102.1 kg)      Physical Exam Vitals and nursing note reviewed.  Constitutional:      Appearance: Normal appearance. She is not ill-appearing.  HENT:     Head: Normocephalic and atraumatic.     Right Ear: Tympanic membrane, ear canal and external ear normal. There is no impacted cerumen.     Left Ear: Tympanic membrane, ear canal and external ear normal. There is no impacted cerumen.  Eyes:     General:        Right eye: No discharge.        Left eye: No discharge.     Extraocular Movements: Extraocular movements intact.     Conjunctiva/sclera: Conjunctivae normal.     Pupils: Pupils are equal, round, and reactive to light.  Neck:     Thyroid: No thyroid mass or thyromegaly.  Cardiovascular:     Rate and Rhythm: Normal rate and regular rhythm.     Pulses: Normal pulses.     Heart sounds: Normal heart sounds. No murmur heard. Pulmonary:     Effort: Pulmonary effort is normal. No respiratory distress.     Breath sounds: Normal breath sounds. No wheezing, rhonchi or rales.  Abdominal:     General: Bowel sounds are normal. There is no distension.     Palpations: Abdomen is soft. There is no mass.     Tenderness: There is no abdominal tenderness. There is no guarding or rebound.     Hernia: No hernia is present.   Musculoskeletal:     Cervical back: Normal range of motion and neck supple. No rigidity.     Right lower leg: No edema.     Left lower leg: No edema.  Lymphadenopathy:     Cervical: No cervical adenopathy.  Skin:    General: Skin is warm and dry.     Findings: No rash.  Neurological:     General: No focal deficit present.     Mental Status: She is alert. Mental status is at baseline.  Psychiatric:        Mood and Affect: Mood normal.        Behavior: Behavior normal.       Results for orders placed or performed during the hospital encounter of 12/20/22  Urine drugs of  abuse scrn w alc, routine (Ref Lab)  Result Value Ref Range   Amphetamines, Urine Negative Cutoff=1000 ng/mL   Barbiturate, Ur Negative Cutoff=300 ng/mL   Benzodiazepine Quant, Ur Negative Cutoff=300 ng/mL   Cannabinoid Quant, Ur Negative Cutoff=50 ng/mL   Cocaine (Metab.) Negative Cutoff=300 ng/mL   Opiate Quant, Ur Negative Cutoff=300 ng/mL   Phencyclidine, Ur Negative Cutoff=25 ng/mL   Methadone Screen, Urine Negative Cutoff=300 ng/mL   Propoxyphene, Urine Negative Cutoff=300 ng/mL   Ethanol U, Quan Negative Cutoff=0.020 %  Sodium  Result Value Ref Range   Sodium 138 135 - 145 mmol/L  TSH  Result Value Ref Range   TSH 1.793 0.350 - 4.500 uIU/mL      01/16/2023    3:58 PM 11/30/2022   10:06 AM 01/16/2022    3:40 PM 12/26/2020    3:04 PM 12/14/2019    3:21 PM  Depression screen PHQ 2/9  Decreased Interest 2 3 0 0 1  Down, Depressed, Hopeless 2 3 0 0 1  PHQ - 2 Score 4 6 0 0 2  Altered sleeping 2 2 0  1  Tired, decreased energy 3 0 1  3  Change in appetite 1 3 0  1  Feeling bad or failure about yourself  3 3 0  2  Trouble concentrating 3 3 1  2   Moving slowly or fidgety/restless 0 0 0  0  Suicidal thoughts 2 3 0  1  PHQ-9 Score 18 20 2  12   Difficult doing work/chores Extremely dIfficult           01/16/2023    3:58 PM 11/30/2022   10:07 AM 01/16/2022    3:41 PM 12/26/2020    3:04 PM  GAD 7 :  Generalized Anxiety Score  Nervous, Anxious, on Edge 2 2 0 0  Control/stop worrying 3 3 0 0  Worry too much - different things 3 3 0 0  Trouble relaxing 2 1 0 0  Restless 0 0 0 0  Easily annoyed or irritable 3 2 0 0  Afraid - awful might happen 1 3 0 0  Total GAD 7 Score 14 14 0 0  Anxiety Difficulty Extremely difficult Extremely difficult  Not difficult at all   Assessment & Plan:   Problem List Items Addressed This Visit     Health maintenance examination - Primary (Chronic)    Preventative protocols reviewed and updated unless pt declined. Discussed healthy diet and lifestyle.       MDD (major depressive disorder), recurrent episode, moderate ([place])    Established with psych, appreciate their care - recent med changes reviewed with patient.  Ongoing work and current events-related stressors.  She notes ongoing SI without plan, contracts for safety, has suicide hotline # accessible, encouraged she undergo intensive outpatient psych program.       HLD (hyperlipidemia)    Chronic, on crestor 5mg  daily. Update FLP. The 10-year ASCVD risk score (Arnett DK, et al., 2019) is: 1.2%   Values used to calculate the score:     Age: 62 years     Sex: Female     Is Non-Hispanic African American: No     Diabetic: No     Tobacco smoker: No     Systolic Blood Pressure: 973 mmHg     Is BP treated: No     HDL Cholesterol: 65.5 mg/dL     Total Cholesterol: 209 mg/dL       Relevant Medications   rosuvastatin (CRESTOR) 5  MG tablet   Other Relevant Orders   Lipid panel   Comprehensive metabolic panel   Gastroesophageal reflux disease    Stable period on omeprazole 40mg  daily - continue this.       Relevant Medications   omeprazole (PRILOSEC) 40 MG capsule   Obesity, Class I, BMI 30-34.9    Congratulated on 20 lb weight loss. Encouraged ongoing efforts towards healthy diet and lifestyle choices to affect sustainable weight loss.         Meds ordered this encounter  Medications    omeprazole (PRILOSEC) 40 MG capsule    Sig: Take 1 capsule (40 mg total) by mouth daily.    Dispense:  100 capsule    Refill:  3   rosuvastatin (CRESTOR) 5 MG tablet    Sig: Take 1 tablet (5 mg total) by mouth daily.    Dispense:  100 tablet    Refill:  3    Orders Placed This Encounter  Procedures   Lipid panel   Comprehensive metabolic panel    Patient Instructions  Labs today Continue walking - congrats on weight loss!  Good to see you today  Return as needed or in 1 year for next physical.   Follow up plan: Return in about 1 year (around 01/17/2024), or if symptoms worsen or fail to improve, for annual exam, prior fasting for blood work.  Ria Bush, MD

## 2023-01-16 NOTE — Assessment & Plan Note (Signed)
Preventative protocols reviewed and updated unless pt declined. Discussed healthy diet and lifestyle.  

## 2023-01-16 NOTE — Assessment & Plan Note (Signed)
Chronic, on crestor 5mg  daily. Update FLP. The 10-year ASCVD risk score (Arnett DK, et al., 2019) is: 1.2%   Values used to calculate the score:     Age: 52 years     Sex: Female     Is Non-Hispanic African American: No     Diabetic: No     Tobacco smoker: No     Systolic Blood Pressure: 545 mmHg     Is BP treated: No     HDL Cholesterol: 65.5 mg/dL     Total Cholesterol: 209 mg/dL

## 2023-01-17 ENCOUNTER — Encounter: Payer: Self-pay | Admitting: Psychiatry

## 2023-01-17 ENCOUNTER — Ambulatory Visit (INDEPENDENT_AMBULATORY_CARE_PROVIDER_SITE_OTHER): Payer: BC Managed Care – PPO | Admitting: Psychiatry

## 2023-01-17 ENCOUNTER — Other Ambulatory Visit: Payer: Self-pay

## 2023-01-17 ENCOUNTER — Emergency Department (EMERGENCY_DEPARTMENT_HOSPITAL)
Admission: EM | Admit: 2023-01-17 | Discharge: 2023-01-18 | Disposition: A | Discharge status: Other Acute Inpatient Hospital | Payer: BC Managed Care – PPO | Source: Home / Self Care | Attending: Emergency Medicine | Admitting: Emergency Medicine

## 2023-01-17 VITALS — BP 110/70 | HR 66 | Temp 98.3°F | Ht 68.0 in | Wt 206.0 lb

## 2023-01-17 DIAGNOSIS — K219 Gastro-esophageal reflux disease without esophagitis: Secondary | ICD-10-CM | POA: Diagnosis not present

## 2023-01-17 DIAGNOSIS — Z733 Stress, not elsewhere classified: Secondary | ICD-10-CM | POA: Diagnosis not present

## 2023-01-17 DIAGNOSIS — F330 Major depressive disorder, recurrent, mild: Secondary | ICD-10-CM | POA: Diagnosis present

## 2023-01-17 DIAGNOSIS — F411 Generalized anxiety disorder: Secondary | ICD-10-CM | POA: Diagnosis present

## 2023-01-17 DIAGNOSIS — F1290 Cannabis use, unspecified, uncomplicated: Secondary | ICD-10-CM

## 2023-01-17 DIAGNOSIS — Z1152 Encounter for screening for COVID-19: Secondary | ICD-10-CM | POA: Diagnosis not present

## 2023-01-17 DIAGNOSIS — F640 Transsexualism: Secondary | ICD-10-CM

## 2023-01-17 DIAGNOSIS — R45851 Suicidal ideations: Secondary | ICD-10-CM

## 2023-01-17 DIAGNOSIS — R001 Bradycardia, unspecified: Secondary | ICD-10-CM | POA: Diagnosis not present

## 2023-01-17 DIAGNOSIS — Z9151 Personal history of suicidal behavior: Secondary | ICD-10-CM | POA: Diagnosis not present

## 2023-01-17 DIAGNOSIS — T447X2A Poisoning by beta-adrenoreceptor antagonists, intentional self-harm, initial encounter: Secondary | ICD-10-CM | POA: Insufficient documentation

## 2023-01-17 DIAGNOSIS — E669 Obesity, unspecified: Secondary | ICD-10-CM | POA: Diagnosis not present

## 2023-01-17 DIAGNOSIS — R9431 Abnormal electrocardiogram [ECG] [EKG]: Secondary | ICD-10-CM | POA: Diagnosis not present

## 2023-01-17 DIAGNOSIS — Z8261 Family history of arthritis: Secondary | ICD-10-CM | POA: Diagnosis not present

## 2023-01-17 DIAGNOSIS — Z6831 Body mass index (BMI) 31.0-31.9, adult: Secondary | ICD-10-CM | POA: Diagnosis not present

## 2023-01-17 DIAGNOSIS — F419 Anxiety disorder, unspecified: Secondary | ICD-10-CM | POA: Diagnosis not present

## 2023-01-17 DIAGNOSIS — Z79899 Other long term (current) drug therapy: Secondary | ICD-10-CM | POA: Diagnosis not present

## 2023-01-17 DIAGNOSIS — F332 Major depressive disorder, recurrent severe without psychotic features: Secondary | ICD-10-CM

## 2023-01-17 DIAGNOSIS — F331 Major depressive disorder, recurrent, moderate: Secondary | ICD-10-CM | POA: Diagnosis not present

## 2023-01-17 DIAGNOSIS — T50902A Poisoning by unspecified drugs, medicaments and biological substances, intentional self-harm, initial encounter: Secondary | ICD-10-CM

## 2023-01-17 DIAGNOSIS — Z8249 Family history of ischemic heart disease and other diseases of the circulatory system: Secondary | ICD-10-CM | POA: Diagnosis not present

## 2023-01-17 DIAGNOSIS — E785 Hyperlipidemia, unspecified: Secondary | ICD-10-CM | POA: Diagnosis not present

## 2023-01-17 DIAGNOSIS — F22 Delusional disorders: Secondary | ICD-10-CM | POA: Diagnosis not present

## 2023-01-17 DIAGNOSIS — Z87891 Personal history of nicotine dependence: Secondary | ICD-10-CM | POA: Diagnosis not present

## 2023-01-17 DIAGNOSIS — Z833 Family history of diabetes mellitus: Secondary | ICD-10-CM | POA: Diagnosis not present

## 2023-01-17 DIAGNOSIS — F1210 Cannabis abuse, uncomplicated: Secondary | ICD-10-CM | POA: Diagnosis not present

## 2023-01-17 DIAGNOSIS — Z609 Problem related to social environment, unspecified: Secondary | ICD-10-CM | POA: Diagnosis present

## 2023-01-17 DIAGNOSIS — M069 Rheumatoid arthritis, unspecified: Secondary | ICD-10-CM | POA: Diagnosis not present

## 2023-01-17 DIAGNOSIS — K2270 Barrett's esophagus without dysplasia: Secondary | ICD-10-CM | POA: Diagnosis not present

## 2023-01-17 DIAGNOSIS — Z8041 Family history of malignant neoplasm of ovary: Secondary | ICD-10-CM | POA: Diagnosis not present

## 2023-01-17 DIAGNOSIS — Z818 Family history of other mental and behavioral disorders: Secondary | ICD-10-CM | POA: Diagnosis not present

## 2023-01-17 DIAGNOSIS — Z811 Family history of alcohol abuse and dependence: Secondary | ICD-10-CM | POA: Diagnosis not present

## 2023-01-17 LAB — COMPREHENSIVE METABOLIC PANEL
ALT: 27 U/L (ref 0–35)
ALT: 28 U/L (ref 0–44)
AST: 25 U/L (ref 0–37)
AST: 23 U/L (ref 15–41)
Albumin: 4.5 g/dL (ref 3.5–5.2)
Albumin: 4.3 g/dL (ref 3.5–5.0)
Alkaline Phosphatase: 95 U/L (ref 39–117)
Alkaline Phosphatase: 89 U/L (ref 38–126)
Anion gap: 8 (ref 5–15)
BUN: 10 mg/dL (ref 6–23)
BUN: 15 mg/dL (ref 6–20)
CO2: 27 mEq/L (ref 19–32)
CO2: 27 mmol/L (ref 22–32)
Calcium: 9.5 mg/dL (ref 8.4–10.5)
Calcium: 9.2 mg/dL (ref 8.9–10.3)
Chloride: 101 mEq/L (ref 96–112)
Chloride: 105 mmol/L (ref 98–111)
Creatinine, Ser: 0.73 mg/dL (ref 0.40–1.20)
Creatinine, Ser: 0.61 mg/dL (ref 0.44–1.00)
GFR, Estimated: >60 mL/min (ref >60)
GFR: 93.9 mL/min (ref >60.00)
Glucose, Bld: 94 mg/dL (ref 70–99)
Glucose, Bld: 102 mg/dL — ABNORMAL HIGH (ref 70–99)
Potassium: 4.2 mEq/L (ref 3.5–5.1)
Potassium: 4.1 mmol/L (ref 3.5–5.1)
Sodium: 136 mEq/L (ref 135–145)
Sodium: 140 mmol/L (ref 135–145)
Total Bilirubin: 0.6 mg/dL (ref 0.2–1.2)
Total Bilirubin: 1 mg/dL (ref 0.3–1.2)
Total Protein: 7.5 g/dL (ref 6.0–8.3)
Total Protein: 7.6 g/dL (ref 6.5–8.1)

## 2023-01-17 LAB — CBC
HCT: 44.6 % (ref 36.0–46.0)
Hemoglobin: 14.3 g/dL (ref 12.0–15.0)
MCH: 28.2 pg (ref 26.0–34.0)
MCHC: 32.1 g/dL (ref 30.0–36.0)
MCV: 88 fL (ref 80.0–100.0)
Platelets: 340 10*3/uL (ref 150–400)
RBC: 5.07 MIL/uL (ref 3.87–5.11)
RDW: 12.6 % (ref 11.5–15.5)
WBC: 7.5 10*3/uL (ref 4.0–10.5)
nRBC: 0 % (ref 0.0–0.2)

## 2023-01-17 LAB — ETHANOL: Alcohol, Ethyl (B): <10 mg/dL (ref <10)

## 2023-01-17 LAB — URINE DRUG SCREEN, QUALITATIVE (ARMC ONLY)
Amphetamines, Ur Screen: NOT DETECTED
Barbiturates, Ur Screen: NOT DETECTED
Benzodiazepine, Ur Scrn: NOT DETECTED
Cannabinoid 50 Ng, Ur ~~LOC~~: NOT DETECTED
Cocaine Metabolite,Ur ~~LOC~~: NOT DETECTED
MDMA (Ecstasy)Ur Screen: NOT DETECTED
Methadone Scn, Ur: NOT DETECTED
Opiate, Ur Screen: NOT DETECTED
Phencyclidine (PCP) Ur S: NOT DETECTED
Tricyclic, Ur Screen: NOT DETECTED

## 2023-01-17 LAB — SALICYLATE LEVEL: Salicylate Lvl: <7 mg/dL — ABNORMAL LOW (ref 7.0–30.0)

## 2023-01-17 LAB — RESP PANEL BY RT-PCR (RSV, FLU A&B, COVID)  RVPGX2
Influenza A by PCR: NEGATIVE
Influenza B by PCR: NEGATIVE
Resp Syncytial Virus by PCR: NEGATIVE
SARS Coronavirus 2 by RT PCR: NEGATIVE

## 2023-01-17 LAB — LIPID PANEL
Cholesterol: 236 mg/dL — ABNORMAL HIGH (ref 0–200)
HDL: 58.7 mg/dL (ref >39.00)
LDL Cholesterol: 149 mg/dL — ABNORMAL HIGH (ref 0–99)
NonHDL: 177.21
Total CHOL/HDL Ratio: 4
Triglycerides: 142 mg/dL (ref 0.0–149.0)
VLDL: 28.4 mg/dL (ref 0.0–40.0)

## 2023-01-17 LAB — PREGNANCY, URINE: Preg Test, Ur: NEGATIVE

## 2023-01-17 LAB — ACETAMINOPHEN LEVEL: Acetaminophen (Tylenol), Serum: <10 ug/mL — ABNORMAL LOW (ref 10–30)

## 2023-01-17 MED ORDER — PROPRANOLOL HCL 10 MG PO TABS: 10.0000 mg | ORAL_TABLET | Freq: Two times a day (BID) | ORAL | 0 refills | Status: DC | PRN

## 2023-01-17 MED ORDER — VENLAFAXINE HCL ER 75 MG PO CP24: 75.0000 mg | ORAL_CAPSULE | Freq: Every day | ORAL | Status: DC

## 2023-01-17 NOTE — ED Notes (Signed)
IVC/Consult ordered/Pending

## 2023-01-17 NOTE — Consult Note (Addendum)
[facility] Face-to-Face Psychiatry Consult   Reason for Consult:  suicidal ideation Referring Physician:  Modesto Charon Patient Identification: Katherine Steele MRN:  [id number] Principal Diagnosis: MDD (major depressive disorder), recurrent episode, moderate (HCC) Diagnosis:  Principal Problem:   MDD (major depressive disorder), recurrent episode, moderate (HCC)   Total Time spent with patient: 30 minutes  Subjective: "I can't figure out why I am so depressed"   Katherine Steele is a 52 y.o. female patient admitted with depression, anxiety, suicidal thoughts with medication overdose  few days ago.   HPI:  Patient presented to the ED via nurse from her outpatient psychiatrist's office (Dr. Elna Breslow).  Patient reports that she had an appointment with Dr. Elna Breslow this morning and disclosed to Dr. Elna Breslow that she she took an overdose of propranolol, 20-30 pills 2 days ago. Patient here for evaluation.   On evaluation, patient  is cooperative. Affect is a bit odd. 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 People have "found out I'm a trump supporter and now they think I am a racist." Patient describes that she  will have obsessive thoughts about things she can't control." She reports that when these excessive thoughts get bad, this is when she gets suicidal thoughts. Patient denies homicidal thoughts, auditory or visual hallucinations. She admits to some paranoia that "people are out to get me at work."   UDS neg. BAL <10. Patient denies illicit drug use. Admits to occasional alcohol use.    Collateral from husband, Varnika Butz, [phone]): Lately she has been thinking people are talking about her. If people are talking together at work she thinks they are talking about her. Shows signs of paranoia. Husband is concerned and feels that patient would benefit from inpatient psychiatric hospitalization. She  gets "overwhelmed" easily." He reports firearms will be secured and not accessible to patient when she is discharged. Informed husband that patient will be referred for inpatient psychiatric hospitalization.     Past Psychiatric History: Patient reports long-standing anxiety and depression. Reports taking an overdose of Tylenol when she was about 52 years old. Took Prozac for 20 years and it "stopped working," currently prescribed Effexor XR.    Risk to Self:   Risk to Others:   Prior Inpatient Therapy:   Prior Outpatient Therapy:    Past Medical History:  Past Medical History:  Diagnosis Date   BMI 33.0-33.9,adult    Endometriosis    s/p hysterectomy   GAD (generalized anxiety disorder)    Dr. Lucianne Muss psychiatrist   Gender dysphoria 2014   did undergo 10 mo testosterone treatment   GERD (gastroesophageal reflux disease)    HLD (hyperlipidemia) 08/12/2014   Mild off meds    MDD (major depressive disorder)    Dr. Lucianne Muss psychiatrist   PONV (postoperative nausea and vomiting)    after finger surgery    Past Surgical History:  Procedure Laterality Date   CESAREAN SECTION     COLONOSCOPY  2007   WNL [facility])   COLONOSCOPY WITH PROPOFOL N/A 03/31/2021   WNL, rpt 10 yrs [name], [facility], MD)   ESOPHAGOGASTRODUODENOSCOPY (EGD) WITH PROPOFOL N/A 03/21/2018   barrett's without dysplasia, mild chronic gastritis, reflux gastroesophagitis [name], [name], MD)   ESOPHAGOGASTRODUODENOSCOPY (EGD) WITH PROPOFOL N/A 03/31/2021   small HH, shatski ring, ?barretts - biopsies WNL Pasty Spillers, MD)   FINGER SURGERY Right 2007   little  finger   OOPHORECTOMY Left 2006   TONSILLECTOMY  1975   TOTAL ABDOMINAL HYSTERECTOMY  12/2012   endometriosis and ovarian cysts s/p ovaries removed   TYMPANOSTOMY TUBE PLACEMENT Bilateral 1976   Family History:  Family History  Problem Relation Age of Onset   Diabetes Mother    Cancer Mother 53       ovarian with mets   Rheum arthritis  Mother    Hypertension Father    Alcohol abuse Father        history   Rheum arthritis Maternal Aunt    CAD Maternal Uncle 70       MI   Alcohol abuse Paternal Uncle    Suicidality Paternal Uncle    CAD Maternal Grandfather 61       MI   Alcohol abuse Cousin    Suicidality Cousin    Drug abuse Cousin    Suicidality Cousin    Anxiety disorder Son    Autism spectrum disorder Son    Stroke Neg Hx    Family Psychiatric  History: Does not report  Social History:  Social History   Substance and Sexual Activity  Alcohol Use Not Currently   Comment: social/weekends     Social History   Substance and Sexual Activity  Drug Use Not Currently   Types: Marijuana   Comment: stopped 2 weeks ago smoking marijuana    Social History   Socioeconomic History   Marital status: Married    Spouse name: michael   Number of children: 1   Years of education: Not on file   Highest education level: Associate degree: academic program  Occupational History   Not on file  Tobacco Use   Smoking status: Former    Types: Cigarettes    Quit date: 02/06/1993    Years since quitting: 29.9   Smokeless tobacco: Never  Vaping Use   Vaping Use: Never used  Substance and Sexual Activity   Alcohol use: Not Currently    Comment: social/weekends   Drug use: Not Currently    Types: Marijuana    Comment: stopped 2 weeks ago smoking marijuana   Sexual activity: Yes  Other Topics Concern   Not on file  Social History Narrative   Lives with husband Ronalee Belts) and son Remo Lipps)   Occupation: Customer service manager   Edu: Associate's degree   Activity: exercise 3x/wk   Diet: good water, fruits/vegetables some   Social Determinants of Health   Financial Resource Strain: Not on file  Food Insecurity: Not on file  Transportation Needs: Not on file  Physical Activity: Not on file  Stress: Not on file  Social Connections: Not on file   Additional Social History:    Allergies:  No Known  Allergies  Labs:  Results for orders placed or performed during the hospital encounter of 01/17/23 (from the past 48 hour(s))  Comprehensive metabolic panel     Status: Abnormal   Collection Time: 01/17/23  8:59 AM  Result Value Ref Range   Sodium 140 135 - 145 mmol/L   Potassium 4.1 3.5 - 5.1 mmol/L   Chloride 105 98 - 111 mmol/L   CO2 27 22 - 32 mmol/L   Glucose, Bld 102 (H) 70 - 99 mg/dL    Comment: Glucose reference range applies only to samples taken after fasting for at least 8 hours.   BUN 15 6 - 20 mg/dL   Creatinine, Ser 0.61 0.44 - 1.00 mg/dL   Calcium 9.2 8.9 - 10.3  mg/dL   Total Protein 7.6 6.5 - 8.1 g/dL   Albumin 4.3 3.5 - 5.0 g/dL   AST 23 15 - 41 U/L   ALT 28 0 - 44 U/L   Alkaline Phosphatase 89 38 - 126 U/L   Total Bilirubin 1.0 0.3 - 1.2 mg/dL   GFR, Estimated >56 >81 mL/min    Comment: (NOTE) Calculated using the CKD-EPI Creatinine Equation (2021)    Anion gap 8 5 - 15    Comment: Performed at [facility], [address] [place], [place] [address]  Ethanol     Status: None   Collection Time: 01/17/23  8:59 AM  Result Value Ref Range   Alcohol, Ethyl (B) <10 <10 mg/dL    Comment: (NOTE) Lowest detectable limit for serum alcohol is 10 mg/dL.  For medical purposes only. Performed at [facility], [address] [place], [place] [address]   Salicylate level     Status: Abnormal   Collection Time: 01/17/23  8:59 AM  Result Value Ref Range   Salicylate Lvl <7.0 (L) 7.0 - 30.0 mg/dL    Comment: Performed at [facility], [address] [place], [place] [address]  Acetaminophen level     Status: Abnormal   Collection Time: 01/17/23  8:59 AM  Result Value Ref Range   Acetaminophen (Tylenol), Serum <10 (L) 10 - 30 ug/mL    Comment: (NOTE) Therapeutic concentrations vary significantly. A range of 10-30 ug/mL  may be an effective concentration for many patients. However, some  are best treated at concentrations outside of  this range. Acetaminophen concentrations >150 ug/mL at 4 hours after ingestion  and >50 ug/mL at 12 hours after ingestion are often associated with  toxic reactions.  Performed at [facility], [address] [place], [place] [address]   cbc     Status: None   Collection Time: 01/17/23  8:59 AM  Result Value Ref Range   WBC 7.5 4.0 - 10.5 K/uL   RBC 5.07 3.87 - 5.11 MIL/uL   Hemoglobin 14.3 12.0 - 15.0 g/dL   HCT 38.4 66.5 - 99.3 %   MCV 88.0 80.0 - 100.0 fL   MCH 28.2 26.0 - 34.0 pg   MCHC 32.1 30.0 - 36.0 g/dL   RDW 57.0 17.7 - 93.9 %   Platelets 340 150 - 400 K/uL   nRBC 0.0 0.0 - 0.2 %    Comment: Performed at [facility], [address], [place], [place] [address]  Urine Drug Screen, Qualitative     Status: None   Collection Time: 01/17/23 10:08 AM  Result Value Ref Range   Tricyclic, Ur Screen NONE DETECTED NONE DETECTED   Amphetamines, Ur Screen NONE DETECTED NONE DETECTED   MDMA (Ecstasy)Ur Screen NONE DETECTED NONE DETECTED   Cocaine Metabolite,Ur [place] NONE DETECTED NONE DETECTED   Opiate, Ur Screen NONE DETECTED NONE DETECTED   Phencyclidine (PCP) Ur S NONE DETECTED NONE DETECTED   Cannabinoid 50 Ng, Ur [place] NONE DETECTED NONE DETECTED   Barbiturates, Ur Screen NONE DETECTED NONE DETECTED   Benzodiazepine, Ur Scrn NONE DETECTED NONE DETECTED   Methadone Scn, Ur NONE DETECTED NONE DETECTED    Comment: (NOTE) Tricyclics + metabolites, urine    Cutoff 1000 ng/mL Amphetamines + metabolites, urine  Cutoff 1000 ng/mL MDMA (Ecstasy), urine              Cutoff 500 ng/mL Cocaine Metabolite, urine  Cutoff 300 ng/mL Opiate + metabolites, urine        Cutoff 300 ng/mL Phencyclidine (PCP), urine         Cutoff 25 ng/mL Cannabinoid, urine                 Cutoff 50 ng/mL Barbiturates + metabolites, urine  Cutoff 200 ng/mL Benzodiazepine, urine              Cutoff 200 ng/mL Methadone, urine                   Cutoff 300 ng/mL  The urine drug screen  provides only a preliminary, unconfirmed analytical test result and should not be used for non-medical purposes. Clinical consideration and professional judgment should be applied to any positive drug screen result due to possible interfering substances. A more specific alternate chemical method must be used in order to obtain a confirmed analytical result. Gas chromatography / mass spectrometry (GC/MS) is the preferred confirm atory method. Performed at [facility], [address] [place], [place] [address]   Pregnancy, urine     Status: None   Collection Time: 01/17/23 10:18 AM  Result Value Ref Range   Preg Test, Ur NEGATIVE NEGATIVE    Comment: Performed at [facility], [address] [place], [place] [address]    Current Facility-Administered Medications  Medication Dose Route Frequency Provider Last Rate Last Admin   [START ON 01/18/2023] venlafaxine XR (EFFEXOR-XR) 24 hr capsule 75 mg  75 mg Oral Q breakfast Waldon Merl F, NP       Current Outpatient Medications  Medication Sig Dispense Refill   omeprazole (PRILOSEC) 40 MG capsule Take 1 capsule (40 mg total) by mouth daily. 100 capsule 3   propranolol (INDERAL) 10 MG tablet Take 1 tablet (10 mg total) by mouth 2 (two) times daily as needed. For severe anxiety attacks only, HOLD FOR NOW 180 tablet 0   rosuvastatin (CRESTOR) 5 MG tablet Take 1 tablet (5 mg total) by mouth daily. 100 tablet 3   venlafaxine XR (EFFEXOR XR) 75 MG 24 hr capsule Take 1 capsule (75 mg total) by mouth daily with breakfast. 30 capsule 0    Musculoskeletal: Strength & Muscle Tone: within normal limits Gait & Station: normal Patient leans: N/A     Psychiatric Specialty Exam:  Presentation  General Appearance: Appropriate for Environment  Eye Contact:Good  Speech:Clear and Coherent  Speech Volume:Normal  Handedness:Right   Mood and Affect  Mood:Depressed  Affect:Blunt; Congruent   Thought Process   Thought Processes:Coherent  Descriptions of Associations:Intact  Orientation:Full (Time, Place and Person)  Thought Content:Paranoid Ideation  History of Schizophrenia/Schizoaffective disorder:No  Duration of Psychotic Symptoms:No data recorded Hallucinations:No data recorded Ideas of Reference:Paranoia  Suicidal Thoughts:Suicidal Thoughts: Yes, Passive  Homicidal Thoughts:Homicidal Thoughts: No   Sensorium  Memory:Immediate Good  Judgment:Fair  Insight:Fair   Executive Functions  Concentration:Fair  Attention Span:Fair  [place]   Psychomotor Activity  Psychomotor Activity:Psychomotor Activity: Normal   Assets  Assets:Desire for Improvement; Financial Resources/Insurance; Housing; Resilience; Social Support   Sleep  Sleep:Sleep: Fair   Physical Exam: Physical Exam Vitals and nursing note reviewed.  HENT:     Head: Normocephalic.     Nose: No congestion or rhinorrhea.  Eyes:     General:        Right eye: No discharge.        Left eye: No discharge.  Cardiovascular:  Rate and Rhythm: Normal rate.  Pulmonary:     Effort: Pulmonary effort is normal.  Musculoskeletal:        General: Normal range of motion.     Cervical back: Normal range of motion.  Skin:    General: Skin is dry.  Neurological:     Mental Status: She is alert.  Psychiatric:        Attention and Perception: Attention normal.        Mood and Affect: Affect is blunt.        Speech: Speech normal.        Behavior: Behavior normal.        Thought Content: Thought content is paranoid. Thought content includes suicidal (passive) ideation.        Cognition and Memory: Cognition normal.    Review of Systems  HENT: Negative.    Respiratory: Negative.    Musculoskeletal: Negative.   Skin: Negative.   Psychiatric/Behavioral:  Positive for depression and suicidal ideas. Negative for hallucinations, memory loss and substance abuse.  The patient is nervous/anxious. The patient does not have insomnia.    Blood pressure 138/78, pulse 65, temperature 98.2 F (36.8 C), temperature source Oral, resp. rate 16, height 5\' 7"  (1.702 m), weight 92 kg, last menstrual period 07/17/2012, SpO2 99 %. Body mass index is 31.77 kg/m.  Treatment Plan Summary: Daily contact with patient to assess and evaluate symptoms and progress in treatment, Medication management, and Plan Re-started Effexor XR 75 mg daily to start 01/18/23.  Reviewed with Dr. Jacelyn Grip  Disposition: Recommend psychiatric Inpatient admission when medically cleared. Supportive therapy provided about ongoing stressors.  Sherlon Handing, NP 01/17/2023 1:33 PM

## 2023-01-17 NOTE — ED Notes (Signed)
Patient moved to [facility]; oriented to department and informed of schedule and policies. Verbalized understanding. Given phone to call husband.

## 2023-01-17 NOTE — ED Notes (Signed)
Report called to [facility].

## 2023-01-17 NOTE — ED Notes (Signed)
Pt given dinner tray and drink 

## 2023-01-17 NOTE — Progress Notes (Signed)
This note is not being shared with the patient for the following reason: To prevent harm (release of this note would result in harm to the life or physical safety of the patient or another).    [place] MD OP Progress Note  01/17/2023 9:01 AM Katherine Steele  MRN:  [id number]  Chief Complaint:  Chief Complaint  Patient presents with   Follow-up   Medication Refill   Depression   Anxiety   HPI: Katherine Steele is a 52 year old Caucasian female, married, employed, lives in [place], has a history of depression, anxiety, gender dysphoria, long-term current use of cannabis, was evaluated in office today.  Patient today presented with worsening depression symptoms, recent suicidal attempt, 2 days ago.  Patient reports she had a conflict at work and that led her to overdose on propranolol.  She reports she initially took around 10 pills of propranolol which she usually carries in her purse.  She reports after coming back home she again took a handful may have been another 10 to 20 pills of propranolol.  This was late Tuesday evening.  Patient reports she took yesterday and today off from work since she did not want to be in that environment.  She reports she feels people at work are against her, feels left out.  She also feels they are talking behind her back, questionable paranoia.  Patient continues to be tearful, depressed, anxious today.  Although she denies suicidality right now agreeable to getting evaluated in the emergency department due to recent suicide attempt when she overdosed on at least 30 pills of propranolol altogether.  Patient agreeable to going to the emergency department with nurse here at the practice.    Visit Diagnosis:    ICD-10-CM   1. Severe episode of recurrent major depressive disorder, without psychotic features ([place])  F33.2     2. Anxiety disorder, unspecified type  F41.9 propranolol (INDERAL) 10 MG tablet    3. Gender dysphoria in adult  F64.0     4. Long term  current use of cannabis  F12.90     5. High risk medication use  Z79.899     6. MDD (major depressive disorder), recurrent episode, moderate (HCC)  F33.1 propranolol (INDERAL) 10 MG tablet      Past Psychiatric History: Reviewed past psychiatric history from progress note on 11/30/2022.  Past trials of medications like Zoloft, Wellbutrin-made her crazy, Xanax.  Past Medical History:  Past Medical History:  Diagnosis Date   BMI 33.0-33.9,adult    Endometriosis    s/p hysterectomy   GAD (generalized anxiety disorder)    Dr. Annitta [place] psychiatrist   Gender dysphoria 2014   did undergo 10 mo testosterone treatment   GERD (gastroesophageal reflux disease)    HLD (hyperlipidemia) 08/12/2014   Mild off meds    MDD (major depressive disorder)    Dr. Annitta Jersey psychiatrist   PONV (postoperative nausea and vomiting)    after finger surgery    Past Surgical History:  Procedure Laterality Date   CESAREAN SECTION     COLONOSCOPY  2007   WNL [facility])   COLONOSCOPY WITH PROPOFOL N/A 03/31/2021   WNL, rpt 10 yrs Bonna Gains, [place] B, MD)   ESOPHAGOGASTRODUODENOSCOPY (EGD) WITH PROPOFOL N/A 03/21/2018   barrett's without dysplasia, mild chronic gastritis, reflux gastroesophagitis Bonna Gains, [name], MD)   ESOPHAGOGASTRODUODENOSCOPY (EGD) WITH PROPOFOL N/A 03/31/2021   small HH, shatski ring, ?barretts - biopsies WNL Virgel Manifold, MD)   FINGER SURGERY Right 2007  little finger   OOPHORECTOMY Left 2006   TONSILLECTOMY  1975   TOTAL ABDOMINAL HYSTERECTOMY  12/2012   endometriosis and ovarian cysts s/p ovaries removed   TYMPANOSTOMY TUBE PLACEMENT Bilateral 1976    Family Psychiatric History: Reviewed family psychiatric history from progress note on 11/30/2022.  Family History:  Family History  Problem Relation Age of Onset   Diabetes Mother    Cancer Mother 37       ovarian with mets   Rheum arthritis Mother    Hypertension Father    Alcohol abuse Father        history    Rheum arthritis Maternal Aunt    CAD Maternal Uncle 70       MI   Alcohol abuse Paternal Uncle    Suicidality Paternal Uncle    CAD Maternal Grandfather 10       MI   Alcohol abuse Cousin    Suicidality Cousin    Drug abuse Cousin    Suicidality Cousin    Anxiety disorder Son    Autism spectrum disorder Son    Stroke Neg Hx     Social History: Reviewed social history from progress note on 11/30/2022. Social History   Socioeconomic History   Marital status: Married    Spouse name: michael   Number of children: 1   Years of education: Not on file   Highest education level: Associate degree: academic program  Occupational History   Not on file  Tobacco Use   Smoking status: Former    Types: Cigarettes    Quit date: 02/06/1993    Years since quitting: 29.9   Smokeless tobacco: Never  Vaping Use   Vaping Use: Never used  Substance and Sexual Activity   Alcohol use: Not Currently    Comment: social/weekends   Drug use: Not Currently    Types: Marijuana    Comment: stopped 2 weeks ago smoking marijuana   Sexual activity: Yes  Other Topics Concern   Not on file  Social History Narrative   Lives with husband Ronalee Belts) and son Remo Lipps)   Occupation: Customer service manager   Edu: Associate's degree   Activity: exercise 3x/wk   Diet: good water, fruits/vegetables some   Social Determinants of Health   Financial Resource Strain: Not on file  Food Insecurity: Not on file  Transportation Needs: Not on file  Physical Activity: Not on file  Stress: Not on file  Social Connections: Not on file    Allergies: No Known Allergies  Metabolic Disorder Labs: No results found for: "HGBA1C", "MPG" No results found for: "PROLACTIN" Lab Results  Component Value Date   CHOL 209 (H) 12/29/2021   TRIG 92.0 12/29/2021   HDL 65.50 12/29/2021   CHOLHDL 3 12/29/2021   VLDL 18.4 12/29/2021   LDLCALC 125 (H) 12/29/2021   LDLCALC 131 (H) 06/26/2021   Lab Results  Component Value  Date   TSH 1.793 12/20/2022   TSH 2.88 06/26/2021    Therapeutic Level Labs: No results found for: "LITHIUM" No results found for: "VALPROATE" No results found for: "CBMZ"  Current Medications: Current Outpatient Medications  Medication Sig Dispense Refill   omeprazole (PRILOSEC) 40 MG capsule Take 1 capsule (40 mg total) by mouth daily. 100 capsule 3   rosuvastatin (CRESTOR) 5 MG tablet Take 1 tablet (5 mg total) by mouth daily. 100 tablet 3   venlafaxine XR (EFFEXOR XR) 75 MG 24 hr capsule Take 1 capsule (75 mg total) by mouth daily with breakfast.  30 capsule 0   propranolol (INDERAL) 10 MG tablet Take 1 tablet (10 mg total) by mouth 2 (two) times daily as needed. For severe anxiety attacks only, HOLD FOR NOW 180 tablet 0   No current facility-administered medications for this visit.     Musculoskeletal: Strength & Muscle Tone: within normal limits Gait & Station: normal Patient leans: N/A  Psychiatric Specialty Exam: Review of Systems  Unable to perform ROS: Acuity of condition    Blood pressure 110/70, pulse 66, temperature 98.3 F (36.8 C), height 5\' 8"  (1.727 m), weight 206 lb (93.4 kg), last menstrual period 07/17/2012, SpO2 98 %.Body mass index is 31.32 kg/m.  General Appearance: Casual  Eye Contact:  Fair  Speech:  Normal Rate  Volume:  Normal  Mood:  Anxious and Depressed  Affect:  Depressed and Tearful  Thought Process:  Linear and Descriptions of Associations: Intact  Orientation:  Full (Time, Place, and Person)  Thought Content:  possible paranoia    Suicidal Thoughts:   currently denies , but reported suicide attempt by overdose on tuesday  Homicidal Thoughts:  No  Memory:  Immediate;   Fair Recent;   Fair Remote;   Fair  Judgement:  Fair  Insight:  Fair  Psychomotor Activity:  Normal  Concentration:  Concentration: Fair and Attention Span: Fair  Recall:  Thursday of Knowledge: Fair  Language: Fair  Akathisia:  No  Handed:  Right  AIMS (if  indicated): not done  Assets:  Communication Skills Desire for Improvement Housing Transportation  ADL's:  Intact  Cognition: WNL  Sleep:  Poor   Screenings: GAD-7    Flowsheet Row Office Visit from 01/17/2023 in [facility] Office Visit from 01/16/2023 in [facility] [place] Office Visit from 11/30/2022 in [facility] Office Visit from 01/16/2022 in [facility] Office Visit from 12/26/2020 in [facility]  Total GAD-7 Score 13 14 14  0 0      PHQ2-9    Flowsheet Row Office Visit from 01/17/2023 in [facility] Office Visit from 01/16/2023 in [facility] Office Visit from 11/30/2022 in [facility] Office Visit from 01/16/2022 in [facility] Office Visit from 12/26/2020 in [facility] [place]  PHQ-2 Total Score 4 4 6  0 0  PHQ-9 Total Score 17 18 20 2  --      Flowsheet Row ED from 01/17/2023 in [facility] at [facility] Most recent reading at 01/17/2023  8:58 AM Office Visit from 01/17/2023 in [facility] Most recent reading at 01/17/2023  8:26 AM Office Visit from 11/30/2022 in [facility] Most recent reading at 11/30/2022 10:06 AM  C-SSRS RISK CATEGORY High Risk High Risk Low Risk        Assessment and Plan: SPRING SAN is a 52 year old Caucasian female, currently unemployed, married, has a history of depression, gender dysphoria, was evaluated in office today after a recent suicide attempt on Tuesday after conflict at work.  Discussed plan as noted below.  Plan MDD severe-unstable Patient currently going through medication changes,  however presented with worsening suicidality with recent attempt on Tuesday after conflict at work.  Will recommend psychiatric evaluation in the emergency department possible admission to inpatient behavioral  health admission. Also will recommend partial hospitalization program or intensive outpatient program if patient does not meet criteria for admission after being evaluated in the emergency department. Staff to escort patient to the emergency department.  Patient agreeable with plan. Continue venlafaxine extended release 75 mg p.o. daily Will recommend holding propranolol for now.  Anxiety disorder unspecified-unstable Patient to be escorted to the emergency department for psychiatric evaluation due to recent worsening mood symptoms with suicide attempt.  High risk medication use-reviewed TSH-12/20/2022-within normal limits, sodium-within normal limits, urine drug screen-within normal limits.  Follow-up in clinic in 1 to 2 weeks or sooner if needed.  Patient agrees to contact the clinic back as soon as possible for discussion of next steps after being evaluated in the emergency department.  I have spent atleast 40 minutes face to face with patient today which includes the time spent for preparing to see the patient ( e.g., review of test, records ), obtaining and to review and separately obtained history , ordering medications  ,psychoeducation and supportive psychotherapy and care coordination with the emergency department,as well as documenting clinical information in electronic health record,interpreting and communication of test results    This note was generated in part or whole with voice recognition software. Voice recognition is usually quite accurate but there are transcription errors that can and very often do occur. I apologize for any typographical errors that were not detected and corrected.     Ursula Alert, MD 01/17/2023, 9:01 AM

## 2023-01-17 NOTE — ED Notes (Signed)
Lunch tray provided, pt tolerated w/o complaints. Waste discarded appropriately.  

## 2023-01-17 NOTE — ED Triage Notes (Signed)
Pt to ED via POV from home. Pt is here voluntary for mental health evaluation. Pt reports she was sent by her psychiatrist due to increasing SI. Pt reports hx of depression with recent med change. Pt states increased stressors at work as well. Pt calm and cooperative.

## 2023-01-17 NOTE — ED Notes (Addendum)
[place] control called, Leslie--recommended EKG, tylenol level, basic labs to rule out congestion.   Dr. Jacelyn Grip notified.

## 2023-01-17 NOTE — ED Notes (Addendum)
Pt belongings:  1 red and blue plaid shirt 1 silver apple watch 1 pair of blue jeans 1 pair sneakers 1 pair of blue socks 1 nude bra 1 pair of black undewear 1 black belt 1 silver necklace and a black hair tie 1 black purse with her cellphone inside 1 navy jacket

## 2023-01-17 NOTE — ED Notes (Signed)
Pt husband in dayroom to visit. Pt relations and security remain in dayroom with pt and her husband to ensure safety and compliance with visitation policies.

## 2023-01-17 NOTE — ED Provider Notes (Addendum)
[facility] Provider Note    Event Date/Time   First MD Initiated Contact with Patient 01/17/23 [id number]     (approximate)   History   Suicidal   HPI  Katherine Steele is a 52 y.o. female   Past medical history of major depressive disorder, generalized anxiety disorder, gender dysphoria, endometriosis, hyperlipidemia who presents to the emergency department with suicidal ideation and worsening depression.  She comes voluntarily at the encouragement of her behavioral health provider.  She had a suicide attempt 2 days ago on Tuesday afternoon approximately 4 PM when she took 30 of her 10 mg propranolol.  Stressors at work have been an inciting event.  She denies any other acute medical complaints ingestions alcohol or drug use.   External Medical Documents Reviewed: Behavioral health provider note from earlier today at 830 on 01/17/2023 suggesting evaluation in the emergency department in detail on propranolol overdose 2 days ago.      Physical Exam   Triage Vital Signs: ED Triage Vitals  Enc Vitals Group     BP 01/17/23 0854 138/78     Pulse Rate 01/17/23 0854 65     Resp 01/17/23 0854 16     Temp 01/17/23 0854 98.2 F (36.8 C)     Temp Source 01/17/23 0854 Oral     SpO2 01/17/23 0854 99 %     Weight 01/17/23 0857 202 lb 13.2 oz (92 kg)     Height 01/17/23 0857 5\' 7"  (1.702 m)     Head Circumference --      Peak Flow --      Pain Score 01/17/23 0857 0     Pain Loc --      Pain Edu? --      Excl. in [place]? --     Most recent vital signs: Vitals:   01/17/23 0854  BP: 138/78  Pulse: 65  Resp: 16  Temp: 98.2 F (36.8 C)  SpO2: 99%    General: Awake, no distress.  CV:  Good peripheral perfusion.  Resp:  Normal effort.  Abd:  No distention.  Other:  Awake alert oriented with normal hemodynamics, appears euvolemic, comfortable.   ED Results / Procedures / Treatments   Labs (all labs ordered are listed, but only abnormal results are  displayed) Labs Reviewed  COMPREHENSIVE METABOLIC PANEL - Abnormal; Notable for the following components:      Result Value   Glucose, Bld 102 (*)    All other components within normal limits  SALICYLATE LEVEL - Abnormal; Notable for the following components:   Salicylate Lvl <9.5 (*)    All other components within normal limits  ACETAMINOPHEN LEVEL - Abnormal; Notable for the following components:   Acetaminophen (Tylenol), Serum <10 (*)    All other components within normal limits  ETHANOL  CBC  URINE DRUG SCREEN, QUALITATIVE ([facility] ONLY)  PREGNANCY, URINE     I ordered and reviewed the above labs they are notable for neg tylenol and asa  EKG  ED ECG REPORT I, Lucillie Garfinkel, the attending physician, personally viewed and interpreted this ECG.   Date: 01/17/2023  EKG Time: 0949  Rate: 56  Rhythm: sinus bradycardia  Axis: nl  Intervals:none  ST&T Change: no ischemic changes     PROCEDURES:  Critical Care performed: No  Procedures   MEDICATIONS ORDERED IN ED: Medications - No data to display  External physician / consultants:  I spoke with [place] regarding care plan for  this patient.   IMPRESSION / MDM / ASSESSMENT AND PLAN / ED COURSE  I reviewed the triage vital signs and the nursing notes.                                Patient's presentation is most consistent with acute presentation with potential threat to life or bodily function.  Differential diagnosis includes, but is not limited to, depression, suicidal ideation, overdose on beta-blocker, coingestions   MDM: This is a patient with depression and suicidal ideation with an attempt with propranolol overdose 2 days ago.  No symptoms currently and given timing of ingestion it is unlikely that she is being affected by the overdose at this time.  Poison control recommends basic screening as ordered above as well as EKG for coingestions.  Basic labs, toxicology labs, EKG ordered to be reviewed  can be medically cleared for psychiatric evaluation.  Given her recent suicide attempt, I placed under involuntary commitment pending psychiatry evaluation.        FINAL CLINICAL IMPRESSION(S) / ED DIAGNOSES   Final diagnoses:  Suicidal ideation  Intentional overdose, initial encounter [facility])     Rx / DC Orders   ED Discharge Orders     None        Note:  This document was prepared using Dragon voice recognition software and may include unintentional dictation errors.    Lucillie Garfinkel, MD 01/17/23 1009    Lucillie Garfinkel, MD 01/17/23 [id number]

## 2023-01-17 NOTE — BH Assessment (Signed)
Comprehensive Clinical Assessment (CCA) Note  01/17/2023 Katherine Steele [id number]  Chief Complaint:  Chief Complaint  Patient presents with   Suicidal   Visit Diagnosis: Major Depression   Katherine Steele is a 52 year old female who presents to the ER after was being advised to do so by her outpatient psychiatrist. Per the patient she took an overdose of medications on Tuesday (01/15/2023). She tried approximately two different times within twenty-four hours. She reports she has been stressed at her job. She states she have been on Prozac for her depression and she can tell it is no longer worker because of how she's unable to deal with the stress. She spoke her psychiatrist and they are currently adjusting the medications. She's having the same symptoms she was having prior to the start of the medications. During the interview, the patient was calm, cooperative and pleasant. She was able to answer questions with appropriate answers. She denies HI and AV/H.  CCA Screening, Triage and Referral (STR)  Patient Reported Information How did you hear about [name]? No data recorded What Is the Reason for Your Visit/Call Today? Took an overdose of her medications.  How Long Has This Been Causing You Problems? 1 wk - 1 month  What Do You Feel Would Help You the Most Today? Treatment for Depression or other mood problem   Have You Recently Had Any Thoughts About Hurting Yourself? Yes  Are You Planning to Commit Suicide/Harm Yourself At This time? Yes   [facility] from 01/17/2023 in [facility] Emergency Department at [facility] Most recent reading at 01/17/2023 11:40 AM Office Visit from 01/17/2023 in confidential department Most recent reading at 01/17/2023  8:26 AM Office Visit from 11/30/2022 in confidential department Most recent reading at 11/30/2022 10:06 AM  C-SSRS RISK CATEGORY Error: Q2 is Yes, you must answer 3, 4, and 5 High Risk Low Risk       Have you Recently Had Thoughts  About [place]? No  Are You Planning to Harm Someone at This Time? No  Explanation: No data recorded  Have You Used Any Alcohol or Drugs in the Past 24 Hours? No  What Did You Use and How Much? No data recorded  Do You Currently Have a Therapist/Psychiatrist? Yes  Name of Therapist/Psychiatrist:    Have You Been Recently Discharged From Any Office Practice or Programs? No  Explanation of Discharge From Practice/Program: No data recorded    CCA Screening Triage Referral Assessment Type of Contact: Face-to-Face  Telemedicine Service Delivery:   Is this Initial or Reassessment?   Date Telepsych consult ordered in [facility]:    Time Telepsych consult ordered in [facility]:    Location of Assessment: [facility]  Provider Location: [facility] ED   Collateral Involvement: No data recorded  Does Patient Have a [place]? No  Legal Guardian Contact Information: No data recorded Copy of Legal Guardianship Form: No data recorded Legal Guardian Notified of Arrival: No data recorded Legal Guardian Notified of Pending Discharge: No data recorded If Minor and Not Living with Parent(s), Who has Custody? No data recorded Is CPS involved or ever been involved? Never  Is APS involved or ever been involved? Never   Patient Determined To Be At Risk for Harm To Self or Others Based on Review of Patient Reported Information or Presenting Complaint? Yes, for Self-Harm  Method: No data recorded Availability of Means: No data recorded Intent: No data recorded Notification Required: No data recorded Additional Information for  Danger to Others Potential: No data recorded Additional Comments for Danger to Others Potential: No data recorded Are There Guns or Other Weapons in [place]? No  Types of Guns/Weapons: No data recorded Are These Weapons Safely Secured?                            No  Who Could Verify You Are Able To Have These Secured: No data recorded Do You Have any  Outstanding Charges, Pending Court Dates, Parole/Probation? No data recorded Contacted To Inform of Risk of Harm To Self or Others: No data recorded   Does Patient Present under Involuntary Commitment? Yes    [place] of Residence: [place]   Patient Currently Receiving the Following Services: Not Receiving Services; Medication Management   Determination of Need: Emergent (2 hours)   Options For Referral: Inpatient Hospitalization     CCA Biopsychosocial Patient Reported Schizophrenia/Schizoaffective Diagnosis in Past: No   Strengths: In outpatient treatment, have support system, have stable housing.   Mental Health Symptoms Depression:   Difficulty Concentrating; Hopelessness   Duration of Depressive symptoms:  Duration of Depressive Symptoms: Greater than two weeks   Mania:   None   Anxiety:    Restlessness; Fatigue; Difficulty concentrating   Psychosis:   None   Duration of Psychotic symptoms:    Trauma:   N/A   Obsessions:   Cause anxiety; Disrupts routine/functioning; Intrusive/time consuming   Compulsions:   N/A   Inattention:   N/A   Hyperactivity/Impulsivity:   N/A   Oppositional/Defiant Behaviors:   N/A   Emotional Irregularity:   N/A   Other Mood/Personality Symptoms:  No data recorded   Mental Status Exam Appearance and self-care  Stature:   Average   Weight:   Average weight   Clothing:   Age-appropriate; Neat/clean   Grooming:   Neglected   Cosmetic use:   None   Posture/gait:   Normal   Motor activity:   -- (Within normal range)   Sensorium  Attention:   Normal   Concentration:   Normal   Orientation:   X5   Recall/memory:   Normal   Affect and Mood  Affect:   Anxious   Mood:   Anxious; Depressed   Relating  Eye contact:   None   Facial expression:   Depressed; Anxious   Attitude toward examiner:   Cooperative   Thought and Language  Speech flow:  Clear and Coherent   Thought  content:   Appropriate to Mood and Circumstances   Preoccupation:  No data recorded  Hallucinations:   None   Organization:   Intact   [employer] of Knowledge:   Fair   Intelligence:   Average   Abstraction:   Normal   Judgement:   Fair   Art therapist:   Adequate   Insight:   Fair   Decision Making:   Normal   Social Functioning  Social Maturity:   Irresponsible   Social Judgement:   Normal; "Fish farm manager   Stress  Stressors:   Work   Coping Ability:   Normal   Skill Deficits:   None   Supports:   Family     Religion: Religion/Spirituality Are You A Religious Person?: No  Leisure/Recreation: Leisure / Recreation Do You Have Hobbies?: No  Exercise/Diet: Exercise/Diet Do You Exercise?: No Have You Gained or Lost A Significant Amount of Weight in the Past Six Months?: No Do You  Follow a Special Diet?: No Do You Have Any Trouble Sleeping?: No   CCA Employment/Education Employment/Work Situation: Employment / Work Situation Employment Situation: Employed Patient's Job has Been Impacted by Current Illness: No  Education: Education Is Patient Currently Attending School?: No Did Physicist, medical?: No Did You Have An Individualized Education Program (IIEP): No Did You Have Any Difficulty At [employer]?: No Patient's Education Has Been Impacted by Current Illness: No   CCA Family/Childhood History Family and Relationship History: Family history Marital status: Single  Childhood History:  Childhood History By whom was/is the patient raised?: Both parents Did patient suffer any verbal/emotional/physical/sexual abuse as a child?: No Did patient suffer from severe childhood neglect?: No Has patient ever been sexually abused/assaulted/raped as an adolescent or adult?: No Was the patient ever a victim of a crime or a disaster?: No Witnessed domestic violence?: No Has patient been affected by domestic violence as an  adult?: No       CCA Substance Use Alcohol/Drug Use: Alcohol / Drug Use Pain Medications: See PTA Prescriptions: See PTA Over the Counter: See PTA History of alcohol / drug use?: No history of alcohol / drug abuse Longest period of sobriety (when/how long): n/a   ASAM's:  Six Dimensions of Multidimensional Assessment  Dimension 1:  Acute Intoxication and/or Withdrawal Potential:      Dimension 2:  Biomedical Conditions and Complications:      Dimension 3:  Emotional, Behavioral, or Cognitive Conditions and Complications:     Dimension 4:  Readiness to Change:     Dimension 5:  Relapse, Continued use, or Continued Problem Potential:     Dimension 6:  Recovery/Living Environment:     ASAM Severity Score:    ASAM Recommended Level of Treatment:     Substance use Disorder (SUD)    Recommendations for Services/Supports/Treatments:    Discharge Disposition:    DSM5 Diagnoses: Patient Active Problem List   Diagnosis Date Noted   Gender dysphoria 11/30/2022   Long term current use of cannabis 11/30/2022   High risk medication use 11/30/2022   Anxiety attack 09/28/2022   Adult attention deficit disorder 02/14/2021   Obesity, Class I, BMI 30-34.9    Surgical menopause, asymptomatic 12/09/2018   Schatzki's ring    Barrett's esophagus    Gastroesophageal reflux disease    Esophageal dysphagia    Hiatal hernia    Acquired trigger finger 09/19/2016   HLD (hyperlipidemia) 08/12/2014   Health maintenance examination 05/04/2014   Anxiety disorder 02/07/2012   MDD (major depressive disorder), recurrent episode, moderate ([place]) 02/07/2012    Referrals to Alternative Service(s): Referred to Alternative Service(s):   Place:   Date:   Time:    Referred to Alternative Service(s):   Place:   Date:   Time:    Referred to Alternative Service(s):   Place:   Date:   Time:    Referred to Alternative Service(s):   Place:   Date:   Time:     Gunnar Fusi MS, LCAS, [facility],  [facility] Therapeutic Triage Specialist 01/17/2023 11:55 AM

## 2023-01-18 ENCOUNTER — Other Ambulatory Visit: Payer: Self-pay | Admitting: Psychiatry

## 2023-01-18 ENCOUNTER — Inpatient Hospital Stay
Admission: AD | Admit: 2023-01-18 | Discharge: 2023-01-19 | DRG: 885 | Disposition: A | Discharge status: Home / Self Care | Payer: BC Managed Care – PPO | Source: Intra-hospital | Attending: Psychiatry | Admitting: Psychiatry

## 2023-01-18 ENCOUNTER — Encounter: Payer: Self-pay | Admitting: Behavioral Health

## 2023-01-18 DIAGNOSIS — K2270 Barrett's esophagus without dysplasia: Secondary | ICD-10-CM | POA: Diagnosis present

## 2023-01-18 DIAGNOSIS — Z8261 Family history of arthritis: Secondary | ICD-10-CM

## 2023-01-18 DIAGNOSIS — Z811 Family history of alcohol abuse and dependence: Secondary | ICD-10-CM

## 2023-01-18 DIAGNOSIS — Z1152 Encounter for screening for COVID-19: Secondary | ICD-10-CM | POA: Diagnosis not present

## 2023-01-18 DIAGNOSIS — Z6831 Body mass index (BMI) 31.0-31.9, adult: Secondary | ICD-10-CM | POA: Diagnosis not present

## 2023-01-18 DIAGNOSIS — F640 Transsexualism: Secondary | ICD-10-CM | POA: Diagnosis present

## 2023-01-18 DIAGNOSIS — F22 Delusional disorders: Secondary | ICD-10-CM | POA: Diagnosis present

## 2023-01-18 DIAGNOSIS — F1210 Cannabis abuse, uncomplicated: Secondary | ICD-10-CM | POA: Diagnosis present

## 2023-01-18 DIAGNOSIS — Z609 Problem related to social environment, unspecified: Secondary | ICD-10-CM | POA: Diagnosis present

## 2023-01-18 DIAGNOSIS — E669 Obesity, unspecified: Secondary | ICD-10-CM | POA: Diagnosis present

## 2023-01-18 DIAGNOSIS — E785 Hyperlipidemia, unspecified: Secondary | ICD-10-CM | POA: Diagnosis present

## 2023-01-18 DIAGNOSIS — M069 Rheumatoid arthritis, unspecified: Secondary | ICD-10-CM | POA: Diagnosis present

## 2023-01-18 DIAGNOSIS — Z79899 Other long term (current) drug therapy: Secondary | ICD-10-CM

## 2023-01-18 DIAGNOSIS — F331 Major depressive disorder, recurrent, moderate: Secondary | ICD-10-CM

## 2023-01-18 DIAGNOSIS — F411 Generalized anxiety disorder: Secondary | ICD-10-CM | POA: Diagnosis present

## 2023-01-18 DIAGNOSIS — T447X2A Poisoning by beta-adrenoreceptor antagonists, intentional self-harm, initial encounter: Secondary | ICD-10-CM | POA: Diagnosis present

## 2023-01-18 DIAGNOSIS — Z733 Stress, not elsewhere classified: Secondary | ICD-10-CM | POA: Diagnosis not present

## 2023-01-18 DIAGNOSIS — Z8249 Family history of ischemic heart disease and other diseases of the circulatory system: Secondary | ICD-10-CM

## 2023-01-18 DIAGNOSIS — Z8041 Family history of malignant neoplasm of ovary: Secondary | ICD-10-CM

## 2023-01-18 DIAGNOSIS — Z87891 Personal history of nicotine dependence: Secondary | ICD-10-CM

## 2023-01-18 DIAGNOSIS — Z9151 Personal history of suicidal behavior: Secondary | ICD-10-CM

## 2023-01-18 DIAGNOSIS — K219 Gastro-esophageal reflux disease without esophagitis: Secondary | ICD-10-CM | POA: Diagnosis present

## 2023-01-18 DIAGNOSIS — F330 Major depressive disorder, recurrent, mild: Secondary | ICD-10-CM | POA: Diagnosis present

## 2023-01-18 DIAGNOSIS — Z818 Family history of other mental and behavioral disorders: Secondary | ICD-10-CM

## 2023-01-18 DIAGNOSIS — Z833 Family history of diabetes mellitus: Secondary | ICD-10-CM

## 2023-01-18 DIAGNOSIS — K2100 Gastro-esophageal reflux disease with esophagitis, without bleeding: Secondary | ICD-10-CM

## 2023-01-18 MED ORDER — ROSUVASTATIN CALCIUM 5 MG PO TABS
5.0000 mg | ORAL_TABLET | Freq: Every day | ORAL | Status: DC
  Filled 2023-01-18: qty 1

## 2023-01-18 MED ORDER — ALUM & MAG HYDROXIDE-SIMETH 200-200-20 MG/5ML PO SUSP: 30.0000 mL | ORAL | Status: DC | PRN

## 2023-01-18 MED ORDER — VENLAFAXINE HCL ER 75 MG PO CP24
75.0000 mg | ORAL_CAPSULE | Freq: Every day | ORAL | Status: DC
  Administered 2023-01-18 – 2023-01-19 (×2): 75 mg via ORAL
  Filled 2023-01-18 (×2): qty 1

## 2023-01-18 MED ORDER — ACETAMINOPHEN 325 MG PO TABS: 650.0000 mg | ORAL_TABLET | Freq: Four times a day (QID) | ORAL | Status: DC | PRN

## 2023-01-18 MED ORDER — PANTOPRAZOLE SODIUM 40 MG PO TBEC
40.0000 mg | DELAYED_RELEASE_TABLET | Freq: Every day | ORAL | Status: DC
  Administered 2023-01-18 – 2023-01-19 (×2): 40 mg via ORAL
  Filled 2023-01-18 (×2): qty 1

## 2023-01-18 MED ORDER — ROSUVASTATIN CALCIUM 5 MG PO TABS
5.0000 mg | ORAL_TABLET | Freq: Every day | ORAL | Status: DC
  Administered 2023-01-18: 5 mg via ORAL
  Filled 2023-01-18: qty 1

## 2023-01-18 MED ORDER — MAGNESIUM HYDROXIDE 400 MG/5ML PO SUSP: 30.0000 mL | Freq: Every day | ORAL | Status: DC | PRN

## 2023-01-18 MED ORDER — FLUOXETINE HCL 20 MG PO CAPS
20.0000 mg | ORAL_CAPSULE | Freq: Every day | ORAL | Status: DC
  Administered 2023-01-18 – 2023-01-19 (×2): 20 mg via ORAL
  Filled 2023-01-18 (×2): qty 1

## 2023-01-18 NOTE — Progress Notes (Signed)
Patient admitted from [facility], report received from [place], [place]. Patient calm and pleasant during assessment. Pt stated that she had an issue at work and that caused her to have [place]. Pt denies SI and verbally contracts for safety. Pt denies SI/HI/AVH, endorses anxiety and depression. Pt oriented to the unit and her room. Pt given education, support, and encouragement to be active in her treatment plan. Pt being monitored Q 15 minutes for safety per unit protocol, remains safe on the unit

## 2023-01-18 NOTE — [facility] Group Notes (Signed)
[facility] Notes:  (Nursing/MHT/Case Management/Adjunct)  Date:  01/18/2023  Time:  10:01 AM  Type of Therapy:   community meeting  Participation Level:  Did Not Attend    Antonieta Pert 01/18/2023, 10:01 AM

## 2023-01-18 NOTE — Progress Notes (Signed)
Pt denies SI/HI/AVH and verbally agrees to approach staff if these become apparent or before harming themselves/others. Rates depression "I have sadness for being here."/10. Rates anxiety 0/10. Rates pain 0/10. Pt stated that the MD told her she may be leaving tomorrow and that made he happy. Pt was smiling and more talkative. Scheduled medications administered to pt, per MD orders. RN provided support and encouragement to pt. Q15 min safety checks implemented and continued. Pt safe on the unit. RN will continue to monitor and intervene as needed.  01/18/23 1200  Psych Admission Type (Psych Patients Only)  Admission Status Involuntary  Psychosocial Assessment  Patient Complaints Sadness  Eye Contact Fair  Facial Expression Sad  Affect Sad  Speech Logical/coherent  Interaction Assertive  Motor Activity Slow  Appearance/Hygiene Unremarkable;In scrubs  Behavior Characteristics Cooperative;Appropriate to situation;Calm  Mood Sad;Pleasant  Aggressive Behavior  Effect No apparent injury  Thought Process  Coherency WDL  Content WDL  Delusions None reported or observed  Perception WDL  Hallucination None reported or observed  Judgment Impaired  Confusion None  Danger to Self  Current suicidal ideation? Denies  Danger to Others  Danger to Others None reported or observed

## 2023-01-18 NOTE — H&P (Signed)
Psychiatric Admission Assessment Adult  Patient Identification: Katherine Steele MRN:  [id number] Date of Evaluation:  01/18/2023 Chief Complaint:  MDD (major depressive disorder), recurrent episode, moderate (HCC) [F33.1] Principal Diagnosis: MDD (major depressive disorder), recurrent episode, moderate (HCC) Diagnosis:  Principal Problem:   MDD (major depressive disorder), recurrent episode, moderate (HCC)  History of Present Illness: Patient seen and chart reviewed.  52 year old woman presented to the emergency room referred by her outpatient psychiatrist.  Patient had gone to see her psychiatrist and revealed that 2 days previously she had taken an impulsive overdose of propranolol.  Patient describes symptoms of major depression that have been present for years but have been worse probably over the last few months.  She is chronically sad and dysphoric.  Fatigued much of the time.  Decreased enjoyment of many activities.  No specific physical symptoms.  Usually no suicidal ideation.  Had recently some stresses at work involving some conflict with coworkers in which she felt like they had turned on her unfairly.  Patient describes herself as very sensitive to these things.  Felt overwhelmed and took an overdose of propranolol with suicidal thoughts at the time but did not follow-up with anything else to kill her self.  Currently she denies any suicidal ideation.  Denies psychosis.  She is currently seeing an outpatient psychiatrist but has not been actively following up with her therapist.  She recently had a change in a few weeks ago and her antidepressant switching Prozac over to venlafaxine.  So far not seeing any improvement.  Says she is not actively using marijuana stopped several weeks ago. Associated Signs/Symptoms: Depression Symptoms:  depressed mood, anhedonia, hopelessness, suicidal attempt, anxiety, (Hypo) Manic Symptoms:  Impulsivity, Anxiety Symptoms:  Excessive Worry, Psychotic  Symptoms:   None PTSD Symptoms: No specific diagnosis of PTSD.  Certainly a long history of stress and traumatic events unclear how much they are directly related to symptoms Total Time spent with patient: 45 minutes  Past Psychiatric History: Longstanding history of depression dating back probably to adolescence.  Patient relates a lot of her chronic mood and anxiety symptoms to longstanding struggle she has had with sexuality and gender identity.  She has identified as lesbian for most of her life but has usually repressed it.  She also came to the realization some years ago that she was transgender.  Although she briefly pursued gender supportive treatment years ago since then she has not been actively doing that.  Feels like she has resigned herself to the situation but is clearly still troubled by it.  Last overdose attempt many many years ago.  Previous good response to Prozac which had seemed to lose its effectiveness recently.  Also previous trials of Zoloft and Paxil which were not tolerated.  Current trial of Effexor extended release so far without much benefit.  Also feels that it may be too sedating for her.  History of cannabis abuse but not alcohol abuse.  Is the patient at risk to self? Yes.    Has the patient been a risk to self in the past 6 months? No.  Has the patient been a risk to self within the distant past? No.  Is the patient a risk to others? No.  Has the patient been a risk to others in the past 6 months? No.  Has the patient been a risk to others within the distant past? No.   [place] Scale:  Flowsheet Row Admission (Current) from 01/18/2023 in [facility] INPATIENT BEHAVIORAL MEDICINE Most  recent reading at 01/18/2023  1:27 AM ED from 01/17/2023 in [facility] Emergency Department at [facility] Most recent reading at 01/17/2023 11:40 AM Office Visit from 01/17/2023 in [facility] Most recent reading at 01/17/2023  8:26 AM  C-SSRS RISK  CATEGORY Moderate Risk Error: Q2 is Yes, you must answer 3, 4, and 5 High Risk        Prior Inpatient Therapy: No. If yes, describe none reported Prior Outpatient Therapy: Yes.   If yes, describe a long history of outpatient mental health care  Alcohol Screening: 1. How often do you have a drink containing alcohol?: Never 2. How many drinks containing alcohol do you have on a typical day when you are drinking?: 1 or 2 3. How often do you have six or more drinks on one occasion?: Never AUDIT-C Score: 0 4. How often during the last year have you found that you were not able to stop drinking once you had started?: Never 5. How often during the last year have you failed to do what was normally expected from you because of drinking?: Never 6. How often during the last year have you needed a first drink in the morning to get yourself going after a heavy drinking session?: Never 7. How often during the last year have you had a feeling of guilt of remorse after drinking?: Never 8. How often during the last year have you been unable to remember what happened the night before because you had been drinking?: Never 9. Have you or someone else been injured as a result of your drinking?: No 10. Has a relative or friend or a doctor or another health worker been concerned about your drinking or suggested you cut down?: No Alcohol Use Disorder Identification Test Final Score (AUDIT): 0 Substance Abuse History in the last 12 months:  No. Consequences of Substance Abuse: Negative Previous Psychotropic Medications: Yes  Psychological Evaluations: Yes  Past Medical History:  Past Medical History:  Diagnosis Date   BMI 33.0-33.9,adult    Endometriosis    s/p hysterectomy   GAD (generalized anxiety disorder)    Dr. Lucianne Muss psychiatrist   Gender dysphoria 2014   did undergo 10 mo testosterone treatment   GERD (gastroesophageal reflux disease)    HLD (hyperlipidemia) 08/12/2014   Mild off meds    MDD (major  depressive disorder)    Dr. Lucianne Muss psychiatrist   PONV (postoperative nausea and vomiting)    after finger surgery    Past Surgical History:  Procedure Laterality Date   CESAREAN SECTION     COLONOSCOPY  2007   WNL [facility])   COLONOSCOPY WITH PROPOFOL N/A 03/31/2021   WNL, rpt 10 yrs [name], [place] B, MD)   ESOPHAGOGASTRODUODENOSCOPY (EGD) WITH PROPOFOL N/A 03/21/2018   barrett's without dysplasia, mild chronic gastritis, reflux gastroesophagitis [name], [name], MD)   ESOPHAGOGASTRODUODENOSCOPY (EGD) WITH PROPOFOL N/A 03/31/2021   small HH, shatski ring, ?barretts - biopsies WNL [name], Michel Bickers B, MD)   FINGER SURGERY Right 2007   little finger   OOPHORECTOMY Left 2006   TONSILLECTOMY  1975   TOTAL ABDOMINAL HYSTERECTOMY  12/2012   endometriosis and ovarian cysts s/p ovaries removed   TYMPANOSTOMY TUBE PLACEMENT Bilateral 1976   Family History:  Family History  Problem Relation Age of Onset   Diabetes Mother    Cancer Mother 16       ovarian with mets   Rheum arthritis Mother    Hypertension Father  Alcohol abuse Father        history   Rheum arthritis Maternal Aunt    CAD Maternal Uncle 55       MI   Alcohol abuse Paternal Uncle    Suicidality Paternal Uncle    CAD Maternal Grandfather 50       MI   Alcohol abuse Cousin    Suicidality Cousin    Drug abuse Cousin    Suicidality Cousin    Anxiety disorder Son    Autism spectrum disorder Son    Stroke Neg Hx    Family Psychiatric  History: Reports multiple family members with severe depression including history of suicide in family.  Has a son who has autistic spectrum disorder. Tobacco Screening:  Social History   Tobacco Use  Smoking Status Former   Types: Cigarettes   Quit date: 02/06/1993   Years since quitting: 29.9  Smokeless Tobacco Never    BH Tobacco Counseling     Are you interested in Tobacco Cessation Medications?  N/A, patient does not use tobacco products Counseled patient  on smoking cessation:  N/A, patient does not use tobacco products Reason Tobacco Screening Not Completed: No value filed.       Social History:  Social History   Substance and Sexual Activity  Alcohol Use Not Currently   Comment: social/weekends     Social History   Substance and Sexual Activity  Drug Use Not Currently   Types: Marijuana   Comment: stopped 2 weeks ago smoking marijuana    Additional Social History:                           Allergies:  No Known Allergies Lab Results:  Results for orders placed or performed during the hospital encounter of 01/17/23 (from the past 48 hour(s))  Comprehensive metabolic panel     Status: Abnormal   Collection Time: 01/17/23  8:59 AM  Result Value Ref Range   Sodium 140 135 - 145 mmol/L   Potassium 4.1 3.5 - 5.1 mmol/L   Chloride 105 98 - 111 mmol/L   CO2 27 22 - 32 mmol/L   Glucose, Bld 102 (H) 70 - 99 mg/dL    Comment: Glucose reference range applies only to samples taken after fasting for at least 8 hours.   BUN 15 6 - 20 mg/dL   Creatinine, Ser 2.42 0.44 - 1.00 mg/dL   Calcium 9.2 8.9 - 35.3 mg/dL   Total Protein 7.6 6.5 - 8.1 g/dL   Albumin 4.3 3.5 - 5.0 g/dL   AST 23 15 - 41 U/L   ALT 28 0 - 44 U/L   Alkaline Phosphatase 89 38 - 126 U/L   Total Bilirubin 1.0 0.3 - 1.2 mg/dL   GFR, Estimated >61 >44 mL/min    Comment: (NOTE) Calculated using the CKD-EPI Creatinine Equation (2021)    Anion gap 8 5 - 15    Comment: Performed at [facility], [address] [place], [place] [address]  Ethanol     Status: None   Collection Time: 01/17/23  8:59 AM  Result Value Ref Range   Alcohol, Ethyl (B) <10 <10 mg/dL    Comment: (NOTE) Lowest detectable limit for serum alcohol is 10 mg/dL.  For medical purposes only. Performed at [facility], [address] [place], [place] [address]   Salicylate level     Status: Abnormal   Collection Time: 01/17/23  8:59 AM  Result Value Ref Range    Salicylate Lvl <3.4 (L) 7.0 - 30.0 mg/dL    Comment: Performed at [facility], [address] [place], [place] [address]  Acetaminophen level     Status: Abnormal   Collection Time: 01/17/23  8:59 AM  Result Value Ref Range   Acetaminophen (Tylenol), Serum <10 (L) 10 - 30 ug/mL    Comment: (NOTE) Therapeutic concentrations vary significantly. A range of 10-30 ug/mL  may be an effective concentration for many patients. However, some  are best treated at concentrations outside of this range. Acetaminophen concentrations >150 ug/mL at 4 hours after ingestion  and >50 ug/mL at 12 hours after ingestion are often associated with  toxic reactions.  Performed at [facility], [address] [place], [place] [address]   cbc     Status: None   Collection Time: 01/17/23  8:59 AM  Result Value Ref Range   WBC 7.5 4.0 - 10.5 K/uL   RBC 5.07 3.87 - 5.11 MIL/uL   Hemoglobin 14.3 12.0 - 15.0 g/dL   HCT 44.6 36.0 - 46.0 %   MCV 88.0 80.0 - 100.0 fL   MCH 28.2 26.0 - 34.0 pg   MCHC 32.1 30.0 - 36.0 g/dL   RDW 12.6 11.5 - 15.5 %   Platelets 340 150 - 400 K/uL   nRBC 0.0 0.0 - 0.2 %    Comment: Performed at [facility], [address], [place], [place] [address]  Urine Drug Screen, Qualitative     Status: None   Collection Time: 01/17/23 10:08 AM  Result Value Ref Range   Tricyclic, Ur Screen NONE DETECTED NONE DETECTED   Amphetamines, Ur Screen NONE DETECTED NONE DETECTED   MDMA (Ecstasy)Ur Screen NONE DETECTED NONE DETECTED   Cocaine Metabolite,Ur [place] NONE DETECTED NONE DETECTED   Opiate, Ur Screen NONE DETECTED NONE DETECTED   Phencyclidine (PCP) Ur S NONE DETECTED NONE DETECTED   Cannabinoid 50 Ng, Ur [place] NONE DETECTED NONE DETECTED   Barbiturates, Ur Screen NONE DETECTED NONE DETECTED   Benzodiazepine, Ur Scrn NONE DETECTED NONE DETECTED   Methadone Scn, Ur NONE DETECTED NONE DETECTED    Comment: (NOTE) Tricyclics + metabolites, urine    Cutoff 1000  ng/mL Amphetamines + metabolites, urine  Cutoff 1000 ng/mL MDMA (Ecstasy), urine              Cutoff 500 ng/mL Cocaine Metabolite, urine          Cutoff 300 ng/mL Opiate + metabolites, urine        Cutoff 300 ng/mL Phencyclidine (PCP), urine         Cutoff 25 ng/mL Cannabinoid, urine                 Cutoff 50 ng/mL Barbiturates + metabolites, urine  Cutoff 200 ng/mL Benzodiazepine, urine              Cutoff 200 ng/mL Methadone, urine                   Cutoff 300 ng/mL  The urine drug screen provides only a preliminary, unconfirmed analytical test result and should not be used for non-medical purposes. Clinical consideration and professional judgment should be applied to any positive drug screen result due to possible interfering substances. A more specific alternate chemical method must be used in order to obtain a confirmed analytical result. Gas chromatography / mass spectrometry (GC/MS) is the preferred confirm atory method. Performed at [facility], [address] [place], [place] [address]   Pregnancy, urine     Status: None   Collection Time: 01/17/23 10:18 AM  Result Value Ref Range   Preg Test, Ur NEGATIVE NEGATIVE    Comment: Performed at [facility], [address] [place], [place] [address]  Resp panel by RT-PCR (RSV, Flu A&B, Covid) Anterior Nasal Swab     Status: None   Collection Time: 01/17/23  1:40 PM   Specimen: Anterior Nasal Swab  Result Value Ref Range   SARS Coronavirus 2 by RT PCR NEGATIVE NEGATIVE    Comment: (NOTE) SARS-CoV-2 target nucleic acids are NOT DETECTED.  The SARS-CoV-2 RNA is generally detectable in upper respiratory specimens during the acute phase of infection. The lowest concentration of SARS-CoV-2 viral copies this assay can detect is 138 copies/mL. A negative result does not preclude SARS-Cov-2 infection and should not be used as the sole basis for treatment or other patient management decisions. A negative result  may occur with  improper specimen collection/handling, submission of specimen other than nasopharyngeal swab, presence of viral mutation(s) within the areas targeted by this assay, and inadequate number of viral copies(<138 copies/mL). A negative result must be combined with clinical observations, patient history, and epidemiological information. The expected result is Negative.  Fact Sheet for Patients:  BloggerCourse.com  Fact Sheet for Healthcare Providers:  SeriousBroker.it  This test is no t yet approved or cleared by the [place] FDA and  has been authorized for detection and/or diagnosis of SARS-CoV-2 by FDA under an Emergency Use Authorization (EUA). This EUA will remain  in effect (meaning this test can be used) for the duration of the COVID-19 declaration under Section 564(b)(1) of the Act, 21 U.S.C.section 360bbb-3(b)(1), unless the authorization is terminated  or revoked sooner.       Influenza A by PCR NEGATIVE NEGATIVE   Influenza B by PCR NEGATIVE NEGATIVE    Comment: (NOTE) The Xpert Xpress SARS-CoV-2/FLU/RSV plus assay is intended as an aid in the diagnosis of influenza from Nasopharyngeal swab specimens and should not be used as a sole basis for treatment. Nasal washings and aspirates are unacceptable for Xpert Xpress SARS-CoV-2/FLU/RSV testing.  Fact Sheet for Patients: BloggerCourse.com  Fact Sheet for Healthcare Providers: SeriousBroker.it  This test is not yet approved or cleared by the [place] FDA and has been authorized for detection and/or diagnosis of SARS-CoV-2 by FDA under an Emergency Use Authorization (EUA). This EUA will remain in effect (meaning this test can be used) for the duration of the COVID-19 declaration under Section 564(b)(1) of the Act, 21 U.S.C. section 360bbb-3(b)(1), unless the authorization is terminated  or revoked.     Resp Syncytial Virus by PCR NEGATIVE NEGATIVE    Comment: (NOTE) Fact Sheet for Patients: BloggerCourse.com  Fact Sheet for Healthcare Providers: SeriousBroker.it  This test is not yet approved or cleared by the [place] FDA and has been authorized for detection and/or diagnosis of SARS-CoV-2 by FDA under an Emergency Use Authorization (EUA). This EUA will remain in effect (meaning this test can be used) for the duration of the COVID-19 declaration under Section 564(b)(1) of the Act, 21 U.S.C. section 360bbb-3(b)(1), unless the authorization is terminated or revoked.  Performed at [facility], [address] [place], [place] [address]     Blood Alcohol level:  Lab Results  Component Value Date   [facility] <10 01/17/2023    Metabolic Disorder Labs:  No results found for: "HGBA1C", "MPG" No  results found for: "PROLACTIN" Lab Results  Component Value Date   CHOL 236 (H) 01/16/2023   TRIG 142.0 01/16/2023   HDL 58.70 01/16/2023   CHOLHDL 4 01/16/2023   VLDL 28.4 01/16/2023   LDLCALC 149 (H) 01/16/2023   LDLCALC 125 (H) 12/29/2021    Current Medications: Current Facility-Administered Medications  Medication Dose Route Frequency Provider Last Rate Last Admin   acetaminophen (TYLENOL) tablet 650 mg  650 mg Oral Q6H PRN Caroline Sauger, NP       alum & mag hydroxide-simeth (MAALOX/MYLANTA) 200-200-20 MG/5ML suspension 30 mL  30 mL Oral Q4H PRN Caroline Sauger, NP       FLUoxetine (PROZAC) capsule 20 mg  20 mg Oral Daily Laurinda Carreno T, MD   20 mg at 01/18/23 1205   magnesium hydroxide (MILK OF MAGNESIA) suspension 30 mL  30 mL Oral Daily PRN Caroline Sauger, NP       pantoprazole (PROTONIX) EC tablet 40 mg  40 mg Oral Daily Caroline Sauger, NP   40 mg at 01/18/23 0818   rosuvastatin (CRESTOR) tablet 5 mg  5 mg Oral Daily Caroline Sauger, NP       venlafaxine XR  (EFFEXOR-XR) 24 hr capsule 75 mg  75 mg Oral Q breakfast Caroline Sauger, NP   75 mg at 01/18/23 0818   PTA Medications: Medications Prior to Admission  Medication Sig Dispense Refill Last Dose   omeprazole (PRILOSEC) 40 MG capsule Take 1 capsule (40 mg total) by mouth daily. 100 capsule 3    propranolol (INDERAL) 10 MG tablet Take 1 tablet (10 mg total) by mouth 2 (two) times daily as needed. For severe anxiety attacks only, HOLD FOR NOW 180 tablet 0    rosuvastatin (CRESTOR) 5 MG tablet Take 1 tablet (5 mg total) by mouth daily. 100 tablet 3    venlafaxine XR (EFFEXOR XR) 75 MG 24 hr capsule Take 1 capsule (75 mg total) by mouth daily with breakfast. 30 capsule 0     Musculoskeletal: Strength & Muscle Tone: within normal limits Gait & Station: normal Patient leans: N/A            Psychiatric Specialty Exam:  Presentation  General Appearance:  Appropriate for Environment  Eye Contact: Good  Speech: Clear and Coherent  Speech Volume: Normal  Handedness: Right   Mood and Affect  Mood: Depressed  Affect: Blunt; Congruent   Thought Process  Thought Processes: Coherent  Duration of Psychotic Symptoms: No psychotic symptoms Past Diagnosis of Schizophrenia or Psychoactive disorder: No  Descriptions of Associations:Intact  Orientation:Full (Time, Place and Person)  Thought Content:Paranoid Ideation  Hallucinations:No data recorded Ideas of Reference:Paranoia  Suicidal Thoughts:Suicidal Thoughts: Yes, Passive  Homicidal Thoughts:Homicidal Thoughts: No   Sensorium  Memory: Immediate Good  Judgment: Fair  Insight: Fair   Community education officer  Concentration: Fair  Attention Span: Fair  Recall: [place] of Knowledge: Fair  Language: Fair   Psychomotor Activity  Psychomotor Activity: Psychomotor Activity: Normal   Assets  Assets: Desire for Improvement; Financial Resources/Insurance; Housing; Resilience; Social  Support   Sleep  Sleep: Sleep: Fair    Physical Exam: Physical Exam Vitals and nursing note reviewed.  Constitutional:      Appearance: Normal appearance.  HENT:     Head: Normocephalic and atraumatic.     Mouth/Throat:     Pharynx: Oropharynx is clear.  Eyes:     Pupils: Pupils are equal, round, and reactive to light.  Cardiovascular:     Rate and  Rhythm: Normal rate and regular rhythm.  Pulmonary:     Effort: Pulmonary effort is normal.     Breath sounds: Normal breath sounds.  Abdominal:     General: Abdomen is flat.     Palpations: Abdomen is soft.  Musculoskeletal:        General: Normal range of motion.  Skin:    General: Skin is warm and dry.  Neurological:     General: No focal deficit present.     Mental Status: She is alert. Mental status is at baseline.  Psychiatric:        Attention and Perception: Attention normal.        Mood and Affect: Mood is anxious and depressed. Affect is blunt.        Speech: Speech is delayed.        Behavior: Behavior is slowed.        Thought Content: Thought content normal. Thought content does not include suicidal ideation.        Cognition and Memory: Cognition normal.        Judgment: Judgment normal.    Review of Systems  Constitutional: Negative.   HENT: Negative.    Eyes: Negative.   Respiratory: Negative.    Cardiovascular: Negative.   Gastrointestinal: Negative.   Musculoskeletal: Negative.   Skin: Negative.   Neurological: Negative.   Psychiatric/Behavioral:  Positive for depression. Negative for hallucinations, memory loss, substance abuse and suicidal ideas. The patient is nervous/anxious. The patient does not have insomnia.    Blood pressure 124/77, pulse 71, temperature 98.2 F (36.8 C), temperature source Oral, resp. rate 17, height 5\' 7"  (1.702 m), weight 92 kg, last menstrual period 07/17/2012, SpO2 100 %. Body mass index is 31.77 kg/m.  Treatment Plan Summary: Daily contact with patient to assess  and evaluate symptoms and progress in treatment, Medication management, and Plan reviewed history.  Lots of support for the patient.  At this point denies suicidal ideation but still seems chronically depressed.  Recommend staying in the hospital at least overnight.  We reviewed medication and I suggested a trial of increasing venlafaxine to 150 mg but she made her own suggestion of restarting fluoxetine in addition to the current dose of venlafaxine.  This seems like a reasonable trial as well to me so we will do that.  Reassess dangerousness tomorrow possible discharge within a day.  Observation Level/Precautions:  15 minute checks  Laboratory:  Chemistry Profile  Psychotherapy:    Medications:    Consultations:    Discharge Concerns:    Estimated LOS:  Other:     Physician Treatment Plan for Primary Diagnosis: MDD (major depressive disorder), recurrent episode, moderate ([place]) Long Term Goal(s): Improvement in symptoms so as ready for discharge  Short Term Goals: Ability to verbalize feelings will improve and Ability to demonstrate self-control will improve  Physician Treatment Plan for Secondary Diagnosis: Principal Problem:   MDD (major depressive disorder), recurrent episode, moderate ([place])  Long Term Goal(s): Improvement in symptoms so as ready for discharge  Short Term Goals: Ability to maintain clinical measurements within normal limits will improve and Compliance with prescribed medications will improve  I certify that inpatient services furnished can reasonably be expected to improve the patient's condition.    Alethia Berthold, MD 2/2/202412:42 PM

## 2023-01-18 NOTE — BHH Counselor (Signed)
Adult Comprehensive Assessment  Patient ID: Katherine Steele, female   DOB: 01/02/1971, 52 y.o.   MRN: [id number]  Information Source: Information source: Patient  Current Stressors:  Patient states their primary concerns and needs for treatment are:: "I was very depressed and Tuesday I took a handful of pills." Patient states their goals for this hospitilization and ongoing recovery are:: "Building up my self-esteem. I've got depression and I'm hard on myself." Educational / Learning stressors: None reported Employment / Job issues: Pt shares that she has issues with her coworkers but does not go into further detail. Family Relationships: None reported Financial / Lack of resources (include bankruptcy): None reported Housing / Lack of housing: None reported Physical health (include injuries & life threatening diseases): None reported Social relationships: None reported Substance abuse: Pt endorses some marijuana use in the past but shares that she quit two or three months ago. Bereavement / Loss: None reported  Living/Environment/Situation:  Living Arrangements: Children, Spouse/significant other Living conditions (as described by patient or guardian): "It's good." Who else lives in the home?: "Live with my husband and son." How long has patient lived in current situation?: "We moved there in 2002...21 years, no 22, going on 22 years." What is atmosphere in current home: Comfortable, Loving, Supportive  Family History:  Marital status: Married Number of Years Married: 23 What types of issues is patient dealing with in the relationship?: "Intimacy issues but outside of that we're like best friends." Additional relationship information: N/A Are you sexually active?: Yes ("But not much.") What is your sexual orientation?: "I'm heterosexual but I'm trans." Has your sexual activity been affected by drugs, alcohol, medication, or emotional stress?: Unable to assess Does patient have  children?: Yes How many children?: 1 How is patient's relationship with their children?: "I'd say it's pretty good."  Childhood History:  By whom was/is the patient raised?: Both parents Additional childhood history information: "It was good except my parents got divorced." Pt shares that they divorced when she was in the fourth grade and her and her brothers remained with their mother until she got married when pt was in the sixth grade. She states that they returned to her father's home after the marriage until the 11th grade when her and her twin brother returned to mother while her older brother remained with her father. Description of patient's relationship with caregiver when they were a child: "It was really good." Patient's description of current relationship with people who raised him/her: Mother and stepfather are deceased. Father: It's good, we just don't see eachother as much as we should. He spends a lot of time taking care of my stepmother." How were you disciplined when you got in trouble as a child/adolescent?: "We were spanked." Does patient have siblings?: Yes Number of Siblings: 2 (older brother and a twin brother.) Description of patient's current relationship with siblings: "Good, reallly good. We've always been close." Did patient suffer any verbal/emotional/physical/sexual abuse as a child?: No Did patient suffer from severe childhood neglect?: No Has patient ever been sexually abused/assaulted/raped as an adolescent or adult?: No Was the patient ever a victim of a crime or a disaster?: No Witnessed domestic violence?: No Has patient been affected by domestic violence as an adult?: No  Education:  Highest grade of school patient has completed: Therapist, occupational in Libr[employer], academic. Currently a student?: No Learning disability?: Yes What learning problems does patient have?: Pt shares that she had a speech impediment in primary school and was in speech  class until  the sixth grade.  Employment/Work Situation:   Employment Situation: Employed Where is Patient Currently Employed?: [employer] How Long has Patient Been Employed?: 15 years Are You Satisfied With Your Job?: Yes ("I like my work overall but I'm getting kind of burnt out.") Do You Work More Than One Job?: No Work Stressors: Issues with coworker which pt did not care to detail other than stating that "We don't hang out with eachother anymore outside of work." Patient's Job has Been Impacted by Current Illness: Yes Describe how Patient's Job has Been Impacted: "It has recently. I went through some medication changes because I was having a difficult time with focus and concentration." What is the Longest Time Patient has Held a Job?: 15 years Where was the Patient Employed at that Time?: [employer] Has Patient ever Been in the [employer]?: No  Financial Resources:   Financial resources: Income from employment, Private insurance Does patient have a representative payee or guardian?: No  Alcohol/Substance Abuse:   What has been your use of drugs/alcohol within the last 12 months?: Pt reports past daily smoking of marijuana bowl pack a couple of times daily. Last use noted as two or three months ago. If attempted suicide, did drugs/alcohol play a role in this?: No Alcohol/Substance Abuse Treatment Hx: Denies past history If yes, describe treatment: N/A Has alcohol/substance abuse ever caused legal problems?: No  Social Support System:   Patient's Community Support System: Good Describe Community Support System: "My family, my brothers are always there when I need them, and three ladies that I can go to. We're also starting to go to church more." Type of faith/religion: Darrick Meigs How does patient's faith help to cope with current illness?: "I do pray and meditate, and then I read."  Leisure/Recreation:   Do You Have Hobbies?: Yes Leisure and Hobbies: "I don't really do a whole lot of hobbies. Play games  on ipad and watch tv. Walk, I like to walk but don't know if you'd count that as a hobby."  Strengths/Needs:   What is the patient's perception of their strengths?: "I'm decent at math. I do like meeting different people, talking to different people. I like to play that Sudoku game." Patient states they can use these personal strengths during their treatment to contribute to their recovery: N/A Patient states these barriers may affect/interfere with their treatment: Pt denies any barriers. Patient states these barriers may affect their return to the community: Pt denies any barriers. Other important information patient would like considered in planning for their treatment: N/A  Discharge Plan:   Currently receiving community mental health services: Yes (From Whom) (Therapist Courtney Cox at [facility]. Dr. Shea Evans [facility]) for medication management.) Patient states concerns and preferences for aftercare planning are: Pt would like to be reconnected with her outpatient providers. Patient states they will know when they are safe and ready for discharge when: "I'm there now actually, I mean I'm not having suicidal thoughts. I wasn't having them yesterday either but Dr. Shea Evans felt I needed to be assessed." Does patient have access to transportation?: Yes Does patient have financial barriers related to discharge medications?: No Patient description of barriers related to discharge medications: N/A Will patient be returning to same living situation after discharge?: Yes  Summary/Recommendations:   Summary and Recommendations (to be completed by the evaluator): Patient is a 52 year old, married, female from [place], [place] ([place]). She came to the hospital because she was referred by her psychiatrist after taking an excessive  amount of medication on Tuesday, 01/15/23. Pt expressed desire to work on building up her self-esteem, "I've got depression and I'm hard on myself." She lives at home  with her husband of 30 years and her 45 year old son. Intimacy issues were acknowledged within her marriage, but she stated that outside of this they are like best friends. Pt is employed and enjoys the work that she does but also acknowledges that she is beginning to feel burnt out/thinking of looking into other employment opportunities/fields. Stressor was noted as issues with her coworkers, however, pt did not elaborate on these issues further. Pt denied any history of trauma/abuse. She endorsed past daily use of marijuana (couple of bowl packs daily) with last use two or three months ago. Pt receives therapy through [facility] (Courtney Cox) and medication management through [facility] (Dr. Shea Evans). Upon discharge, pt plans to return home with continued outpatient services through her current providers. She has a primary diagnosis of Major Depressive Disorder, recurrent, moderate. Recommendations include: crisis stabilization, therapeutic milieu, encourage group attendance and participation, medication management for mood stabilization and development of comprehensive mental wellness plan.  Shirl Harris. 01/18/2023

## 2023-01-18 NOTE — B[facility] Suicide Risk Assessment (Signed)
[facility] INPATIENT:  Family/Significant Other Suicide Prevention Education  Suicide Prevention Education:  Education Completed; Gwynevere Lizana [phone]), has been identified by the patient as the family member/significant other with whom the patient will be residing, and identified as the person(s) who will aid the patient in the event of a mental health crisis (suicidal ideations/suicide attempt).  With written consent from the patient, the family member/significant other has been provided the following suicide prevention education, prior to the and/or following the discharge of the patient.  The suicide prevention education provided includes the following: Suicide risk factors Suicide prevention and interventions National Suicide Hotline telephone number [facility] assessment telephone number [facility] Emergency Assistance [place] and/or Residential Mobile Crisis Unit telephone number  Request made of family/significant other to: Remove weapons (e.g., guns, rifles, knives), all items previously/currently identified as safety concern.   Remove drugs/medications (over-the-counter, prescriptions, illicit drugs), all items previously/currently identified as a safety concern.  The family member/significant other verbalizes understanding of the suicide prevention education information provided.  The family member/significant other agrees to remove the items of safety concern listed above.  [place] shared that pt came into the hospital because her psychiatrist referred her here. He explained that on Tuesday pt took an excessive amount of medication but denied feeling that this was a suicide attempt. He denied feeling that pt was a danger to herself or anyone else. [place] denied pt having access to any weapons. No questions or concerns reported. Contact ended without incident.   Shirl Harris 01/18/2023, 2:57 PM

## 2023-01-18 NOTE — BH IP Treatment Plan (Signed)
Interdisciplinary Treatment and Diagnostic Plan Update  01/18/2023 Time of Session: 09:03 Katherine Steele MRN: [id number]  Principal Diagnosis: MDD (major depressive disorder), recurrent episode, moderate (Katherine Steele)  Secondary Diagnoses: Principal Problem:   MDD (major depressive disorder), recurrent episode, moderate (HCC)   Current Medications:  Current Facility-Administered Medications  Medication Dose Route Frequency Provider Last Rate Last Admin   acetaminophen (TYLENOL) tablet 650 mg  650 mg Oral Q6H PRN Caroline Sauger, NP       alum & mag hydroxide-simeth (MAALOX/MYLANTA) 200-200-20 MG/5ML suspension 30 mL  30 mL Oral Q4H PRN Caroline Sauger, NP       FLUoxetine (PROZAC) capsule 20 mg  20 mg Oral Daily Clapacs, John T, MD   20 mg at 01/18/23 1205   magnesium hydroxide (MILK OF MAGNESIA) suspension 30 mL  30 mL Oral Daily PRN Caroline Sauger, NP       pantoprazole (PROTONIX) EC tablet 40 mg  40 mg Oral Daily Caroline Sauger, NP   40 mg at 01/18/23 0818   rosuvastatin (CRESTOR) tablet 5 mg  5 mg Oral Daily Caroline Sauger, NP       venlafaxine XR (EFFEXOR-XR) 24 hr capsule 75 mg  75 mg Oral Q breakfast Caroline Sauger, NP   75 mg at 01/18/23 0818   PTA Medications: Medications Prior to Admission  Medication Sig Dispense Refill Last Dose   omeprazole (PRILOSEC) 40 MG capsule Take 1 capsule (40 mg total) by mouth daily. 100 capsule 3    propranolol (INDERAL) 10 MG tablet Take 1 tablet (10 mg total) by mouth 2 (two) times daily as needed. For severe anxiety attacks only, HOLD FOR NOW 180 tablet 0    rosuvastatin (CRESTOR) 5 MG tablet Take 1 tablet (5 mg total) by mouth daily. 100 tablet 3    venlafaxine XR (EFFEXOR XR) 75 MG 24 hr capsule Take 1 capsule (75 mg total) by mouth daily with breakfast. 30 capsule 0     Patient Stressors: Other: Work stress    Patient Strengths: Ability for insight  Motivation for treatment/growth   Treatment Modalities:  Medication Management, Group therapy, Case management,  1 to 1 session with clinician, Psychoeducation, Recreational therapy.   Physician Treatment Plan for Primary Diagnosis: MDD (major depressive disorder), recurrent episode, moderate (HCC) Long Term Goal(s): Improvement in symptoms so as ready for discharge   Short Term Goals: Ability to maintain clinical measurements within normal limits will improve Compliance with prescribed medications will improve Ability to verbalize feelings will improve Ability to demonstrate self-control will improve  Medication Management: Evaluate patient's response, side effects, and tolerance of medication regimen.  Therapeutic Interventions: 1 to 1 sessions, Unit Group sessions and Medication administration.  Evaluation of Outcomes: Progressing  Physician Treatment Plan for Secondary Diagnosis: Principal Problem:   MDD (major depressive disorder), recurrent episode, moderate ([place])  Long Term Goal(s): Improvement in symptoms so as ready for discharge   Short Term Goals: Ability to maintain clinical measurements within normal limits will improve Compliance with prescribed medications will improve Ability to verbalize feelings will improve Ability to demonstrate self-control will improve     Medication Management: Evaluate patient's response, side effects, and tolerance of medication regimen.  Therapeutic Interventions: 1 to 1 sessions, Unit Group sessions and Medication administration.  Evaluation of Outcomes: Progressing   RN Treatment Plan for Primary Diagnosis: MDD (major depressive disorder), recurrent episode, moderate (HCC) Long Term Goal(s): Knowledge of disease and therapeutic regimen to maintain health will improve  Short Term Goals: Ability to  remain free from injury will improve, Ability to verbalize frustration and anger appropriately will improve, Ability to demonstrate self-control, Ability to participate in decision making will  improve, Ability to verbalize feelings will improve, Ability to disclose and discuss suicidal ideas, Ability to identify and develop effective coping behaviors will improve, and Compliance with prescribed medications will improve  Medication Management: RN will administer medications as ordered by provider, will assess and evaluate patient's response and provide education to patient for prescribed medication. RN will report any adverse and/or side effects to prescribing provider.  Therapeutic Interventions: 1 on 1 counseling sessions, Psychoeducation, Medication administration, Evaluate responses to treatment, Monitor vital signs and CBGs as ordered, Perform/monitor CIWA, COWS, AIMS and Fall Risk screenings as ordered, Perform wound care treatments as ordered.  Evaluation of Outcomes: Progressing   LCSW Treatment Plan for Primary Diagnosis: MDD (major depressive disorder), recurrent episode, moderate ([place]) Long Term Goal(s): Safe transition to appropriate next level of care at discharge, Engage patient in therapeutic group addressing interpersonal concerns.  Short Term Goals: Engage patient in aftercare planning with referrals and resources, Increase social support, Increase ability to appropriately verbalize feelings, Increase emotional regulation, Facilitate acceptance of mental health diagnosis and concerns, and Increase skills for wellness and recovery  Therapeutic Interventions: Assess for all discharge needs, 1 to 1 time with Social worker, Explore available resources and support systems, Assess for adequacy in community support network, Educate family and significant other(s) on suicide prevention, Complete Psychosocial Assessment, Interpersonal group therapy.  Evaluation of Outcomes: Progressing   Progress in Treatment: Attending groups: No. Participating in groups: No. Taking medication as prescribed: Yes. Toleration medication: Yes. Family/Significant other contact made: Yes,  individual(s) contacted:  husband, Mirenda Baltazar. Patient understands diagnosis: Yes. Discussing patient identified problems/goals with staff: Yes. Medical problems stabilized or resolved: Yes. Denies suicidal/homicidal ideation: Yes. Issues/concerns per patient self-inventory: No. Other: none.  New problem(s) identified: No, Describe:  none identified.  New Short Term/Long Term Goal(s): medication management for mood stabilization; elimination of SI thoughts; development of comprehensive mental wellness plan.  Patient Goals:  "Building up my self-esteem. I've got depression and I'm hard on myself."   Discharge Plan or Barriers: CSW will assist pt with development of an appropriate aftercare/discharge plan.   Reason for Continuation of Hospitalization: Depression Medication stabilization Suicidal ideation  Estimated Length of Stay: 1-7 days  Last 3 [place] Suicide Severity Risk Score: [place] Admission (Current) from 01/18/2023 in [place] Most recent reading at 01/18/2023  1:27 AM ED from 01/17/2023 in [facility] Emergency Department at [facility] Most recent reading at 01/17/2023 11:40 AM Office Visit from 01/17/2023 in [place] Most recent reading at 01/17/2023  8:26 AM  C-SSRS RISK CATEGORY Moderate Risk Error: Q2 is Yes, you must answer 3, 4, and 5 High Risk       Last PHQ 2/9 Scores:    01/17/2023    8:26 AM 01/16/2023    3:58 PM 11/30/2022   10:06 AM  Depression screen PHQ 2/9  Decreased Interest 2 2 3   Down, Depressed, Hopeless 2 2 3   PHQ - 2 Score 4 4 6   Altered sleeping 2 2 2   Tired, decreased energy 3 3 0  Change in appetite 0 1 3  Feeling bad or failure about yourself  3 3 3   Trouble concentrating 3 3 3   Moving slowly or fidgety/restless 0 0 0  Suicidal thoughts 2 2 3   PHQ-9 Score 17 18 20  Difficult doing work/chores Extremely dIfficult Extremely dIfficult     Scribe for Treatment  Team: Shirl Harris, Marlinda Mike 01/18/2023 3:47 PM

## 2023-01-18 NOTE — Plan of Care (Signed)
  Problem: Nutrition: Goal: Adequate nutrition will be maintained Outcome: Progressing   Problem: Coping: Goal: Level of anxiety will decrease Outcome: Progressing   

## 2023-01-18 NOTE — BHH Suicide Risk Assessment (Signed)
[facility] Admission Suicide Risk Assessment   Nursing information obtained from:  Patient Demographic factors:  Caucasian Current Mental Status:  Suicidal ideation indicated by patient Loss Factors:  NA (Pt stated she had an issue at work that started her SI thoughts) Historical Factors:  Impulsivity Risk Reduction Factors:  Positive social support, Positive therapeutic relationship, Sense of responsibility to family  Total Time spent with patient: 45 minutes Principal Problem: MDD (major depressive disorder), recurrent episode, moderate ([place]) Diagnosis:  Principal Problem:   MDD (major depressive disorder), recurrent episode, moderate ([place])  Subjective Data: Patient seen and chart reviewed.  52 year old woman presented to the emergency room after referral by her outpatient psychiatrist.  She had gone to see her psychiatrist and revealed that a couple days earlier she had taken an intentional overdose of propranolol with suicidal ideation.  Patient describes longstanding depression with worsening over the last few months and acutely major stress at work causing her to impulsively overdose on propranolol.  Currently endorses multiple symptoms of depression but denies suicidal ideation.  No evidence of psychosis.  Appropriately involved in treatment.  Continued Clinical Symptoms:  Alcohol Use Disorder Identification Test Final Score (AUDIT): 0 The "Alcohol Use Disorders Identification Test", Guidelines for Use in Primary Care, Second Edition.  World Pharmacologist [facility]). Score between 0-7:  no or low risk or alcohol related problems. Score between 8-15:  moderate risk of alcohol related problems. Score between 16-19:  high risk of alcohol related problems. Score 20 or above:  warrants further diagnostic evaluation for alcohol dependence and treatment.   CLINICAL FACTORS:   Depression:   Severe   Musculoskeletal: Strength & Muscle Tone: within normal limits Gait & Station: normal Patient  leans: N/A  Psychiatric Specialty Exam:  Presentation  General Appearance:  Appropriate for Environment  Eye Contact: Good  Speech: Clear and Coherent  Speech Volume: Normal  Handedness: Right   Mood and Affect  Mood: Depressed  Affect: Blunt; Congruent   Thought Process  Thought Processes: Coherent  Descriptions of Associations:Intact  Orientation:Full (Time, Place and Person)  Thought Content:Paranoid Ideation  History of Schizophrenia/Schizoaffective disorder:No  Duration of Psychotic Symptoms:No data recorded Hallucinations:No data recorded Ideas of Reference:Paranoia  Suicidal Thoughts:Suicidal Thoughts: Yes, Passive  Homicidal Thoughts:Homicidal Thoughts: No   Sensorium  Memory: Immediate Good  Judgment: Fair  Insight: Fair   Community education officer  Concentration: Fair  Attention Span: Fair  Recall: Katherine Steele of Knowledge: Fair  Language: Fair   Psychomotor Activity  Psychomotor Activity: Psychomotor Activity: Normal   Assets  Assets: Desire for Improvement; Financial Resources/Insurance; Housing; Resilience; Social Support   Sleep  Sleep: Sleep: Fair    Physical Exam: Physical Exam Vitals and nursing note reviewed.  Constitutional:      Appearance: Normal appearance.  HENT:     Head: Normocephalic and atraumatic.     Mouth/Throat:     Pharynx: Oropharynx is clear.  Eyes:     Pupils: Pupils are equal, round, and reactive to light.  Cardiovascular:     Rate and Rhythm: Normal rate and regular rhythm.  Pulmonary:     Effort: Pulmonary effort is normal.     Breath sounds: Normal breath sounds.  Abdominal:     General: Abdomen is flat.     Palpations: Abdomen is soft.  Musculoskeletal:        General: Normal range of motion.  Skin:    General: Skin is warm and dry.  Neurological:     General: No focal deficit  present.     Mental Status: She is alert. Mental status is at baseline.  Psychiatric:         Attention and Perception: Attention normal.        Mood and Affect: Mood is anxious and depressed. Affect is blunt.        Speech: Speech is delayed.        Behavior: Behavior is slowed.        Thought Content: Thought content normal. Thought content does not include suicidal ideation.        Cognition and Memory: Cognition normal.        Judgment: Judgment normal.    Review of Systems  Constitutional: Negative.   HENT: Negative.    Eyes: Negative.   Respiratory: Negative.    Cardiovascular: Negative.   Gastrointestinal: Negative.   Musculoskeletal: Negative.   Skin: Negative.   Neurological: Negative.   Psychiatric/Behavioral:  Positive for depression. Negative for hallucinations, memory loss, substance abuse and suicidal ideas. The patient is nervous/anxious. The patient does not have insomnia.    Blood pressure 124/77, pulse 71, temperature 98.2 F (36.8 C), temperature source Oral, resp. rate 17, height 5\' 7"  (1.702 m), weight 92 kg, last menstrual period 07/17/2012, SpO2 100 %. Body mass index is 31.77 kg/m.   COGNITIVE FEATURES THAT CONTRIBUTE TO RISK:  Steele    SUICIDE RISK:   Minimal: No identifiable suicidal ideation.  Patients presenting with no risk factors but with morbid ruminations; may be classified as minimal risk based on the severity of the depressive symptoms  PLAN OF CARE: Ongoing assessment of dangerousness prior to discharge.  Continue 15-minute checks.  Engage in individual and group therapy.  Discussed medication management.  Involvement in individual and group therapy and assessment.  I certify that inpatient services furnished can reasonably be expected to improve the patient's condition.   Alethia Berthold, MD 01/18/2023, 12:39 PM

## 2023-01-18 NOTE — Tx Team (Signed)
Initial Treatment Plan 01/18/2023 1:37 AM Katherine Steele [id number]    PATIENT STRESSORS: Other: Work stress     PATIENT STRENGTHS: Ability for insight  Motivation for treatment/growth    PATIENT IDENTIFIED PROBLEMS: Suicidal Ideation  Depression  Anxiety                 DISCHARGE CRITERIA:  Improved stabilization in mood, thinking, and/or behavior Verbal commitment to aftercare and medication compliance  PRELIMINARY DISCHARGE PLAN: Outpatient therapy Return to previous living arrangement  PATIENT/FAMILY INVOLVEMENT: This treatment plan has been presented to and reviewed with the patient, Katherine Steele. The patient has been given the opportunity to ask questions and make suggestions.  Mallie Darting, RN 01/18/2023, 1:37 AM

## 2023-01-18 NOTE — BHH Group Notes (Signed)
[place] Group Notes:  (Nursing/MHT/Case Management/Adjunct)  Date:  01/18/2023  Time:  9:06 PM  Type of Therapy:  Group Therapy  Participation Level:  Active  Participation Quality:  Appropriate and Attentive  Affect:  Appropriate  Cognitive:  Appropriate  Insight:  Good  Engagement in Group:  Engaged  Modes of Intervention:  Discussion  Summary of Progress/Problems:  Ladona Mow 01/18/2023, 9:06 PM

## 2023-01-18 NOTE — Plan of Care (Signed)
  Problem: Education: Goal: Knowledge of General Education information will improve Description: Including pain rating scale, medication(s)/side effects and non-pharmacologic comfort measures Outcome: Progressing   Problem: Nutrition: Goal: Adequate nutrition will be maintained Outcome: Progressing   Problem: Coping: Goal: Level of anxiety will decrease Outcome: Progressing   

## 2023-01-18 NOTE — Plan of Care (Signed)

## 2023-01-19 DIAGNOSIS — F331 Major depressive disorder, recurrent, moderate: Secondary | ICD-10-CM | POA: Diagnosis not present

## 2023-01-19 MED ORDER — VENLAFAXINE HCL ER 75 MG PO CP24: 75.0000 mg | ORAL_CAPSULE | Freq: Every day | ORAL | 0 refills | Status: DC

## 2023-01-19 MED ORDER — FLUOXETINE HCL 20 MG PO CAPS: 20.0000 mg | ORAL_CAPSULE | Freq: Every day | ORAL | 1 refills | Status: DC

## 2023-01-19 MED ORDER — OMEPRAZOLE 40 MG PO CPDR: 40.0000 mg | DELAYED_RELEASE_CAPSULE | Freq: Every day | ORAL | 0 refills | Status: DC

## 2023-01-19 MED ORDER — ROSUVASTATIN CALCIUM 5 MG PO TABS: 5.0000 mg | ORAL_TABLET | Freq: Every day | ORAL | 0 refills | Status: DC

## 2023-01-19 NOTE — Progress Notes (Signed)
Discharge Note:  Patient denies SI/HI/AVH at this time. Discharge instructions, AVS, prescriptions, and transition record gone over with patient. Patient agrees to comply with medication management, follow-up visit, and outpatient therapy. Patient belongings returned to patient. Patient questions and concerns addressed and answered. Patient ambulatory off unit. Patient discharged to home.

## 2023-01-19 NOTE — Discharge Summary (Signed)
Physician Discharge Summary Note  Patient:  Katherine Steele is an 52 y.o., female MRN:  [id number] DOB:  Mar 10, 1971 Patient phone:  [phone] (home)  Patient address:   [address] [place] [address],  Total Time spent with patient: 30 minutes  Date of Admission:  01/18/2023 Date of Discharge: 01/19/2023  Reason for Admission: Patient was admitted after presenting to the emergency room with history of a recent overdose on propranolol.  Ongoing depression and anxiety.  Principal Problem: MDD (major depressive disorder), recurrent episode, moderate ([place]) Discharge Diagnoses: Principal Problem:   MDD (major depressive disorder), recurrent episode, moderate ([place])   Past Psychiatric History: History of longstanding depression and recent worsening from social stress.  Recent overdose with suicidal intent.  Past Medical History:  Past Medical History:  Diagnosis Date   BMI 33.0-33.9,adult    Endometriosis    s/p hysterectomy   GAD (generalized anxiety disorder)    Dr. Annitta [place] psychiatrist   Gender dysphoria 2014   did undergo 10 mo testosterone treatment   GERD (gastroesophageal reflux disease)    HLD (hyperlipidemia) 08/12/2014   Mild off meds    MDD (major depressive disorder)    Dr. Annitta Jersey psychiatrist   PONV (postoperative nausea and vomiting)    after finger surgery    Past Surgical History:  Procedure Laterality Date   CESAREAN SECTION     COLONOSCOPY  2007   WNL [facility])   COLONOSCOPY WITH PROPOFOL N/A 03/31/2021   WNL, rpt 10 yrs Bonna Gains, Robert Lee B, MD)   ESOPHAGOGASTRODUODENOSCOPY (EGD) WITH PROPOFOL N/A 03/21/2018   barrett's without dysplasia, mild chronic gastritis, reflux gastroesophagitis Bonna Gains, [name], MD)   ESOPHAGOGASTRODUODENOSCOPY (EGD) WITH PROPOFOL N/A 03/31/2021   small HH, shatski ring, ?barretts - biopsies WNL Bonna Gains, [name], MD)   FINGER SURGERY Right 2007   little finger   OOPHORECTOMY Left 2006   TONSILLECTOMY  1975    TOTAL ABDOMINAL HYSTERECTOMY  12/2012   endometriosis and ovarian cysts s/p ovaries removed   TYMPANOSTOMY TUBE PLACEMENT Bilateral 1976   Family History:  Family History  Problem Relation Age of Onset   Diabetes Mother    Cancer Mother 58       ovarian with mets   Rheum arthritis Mother    Hypertension Father    Alcohol abuse Father        history   Rheum arthritis Maternal Aunt    CAD Maternal Uncle 67       MI   Alcohol abuse Paternal Uncle    Suicidality Paternal Uncle    CAD Maternal Grandfather 33       MI   Alcohol abuse Cousin    Suicidality Cousin    Drug abuse Cousin    Suicidality Cousin    Anxiety disorder Son    Autism spectrum disorder Son    Stroke Neg Hx    Family Psychiatric  History: See previous Social History:  Social History   Substance and Sexual Activity  Alcohol Use Not Currently   Comment: social/weekends     Social History   Substance and Sexual Activity  Drug Use Not Currently   Types: Marijuana   Comment: stopped 2 weeks ago smoking marijuana    Social History   Socioeconomic History   Marital status: Married    Spouse name: michael   Number of children: 1   Years of education: Not on file   Highest education level: Associate degree: academic program  Occupational History   Not on  file  Tobacco Use   Smoking status: Former    Types: Cigarettes    Quit date: 02/06/1993    Years since quitting: 29.9   Smokeless tobacco: Never  Vaping Use   Vaping Use: Never used  Substance and Sexual Activity   Alcohol use: Not Currently    Comment: social/weekends   Drug use: Not Currently    Types: Marijuana    Comment: stopped 2 weeks ago smoking marijuana   Sexual activity: Yes  Other Topics Concern   Not on file  Social History Narrative   Lives with husband Ronalee Belts) and son Remo Lipps)   Occupation: Customer service manager   Edu: Associate's degree   Activity: exercise 3x/wk   Diet: good water, fruits/vegetables some   Social  Determinants of Health   Financial Resource Strain: Not on file  Food Insecurity: No Food Insecurity (01/18/2023)   Hunger Vital Sign    Worried About [place] in the Last Year: Never true    [place] in the Last Year: Never true  Transportation Needs: No Transportation Needs (01/18/2023)   PRAPARE - Hydrologist (Medical): No    Lack of Transportation (Non-Medical): No  Physical Activity: Not on file  Stress: Not on file  Social Connections: Not on file    Hospital Course: Patient admitted to psychiatric unit.  15-minute checks continued.  Patient did not display any dangerous behavior in the hospital.  She was appropriate and very forthcoming and insightful about her depression and anxiety.  We discussed medication options and the patient suggested trying to restart fluoxetine possibly even considering a titration but for now a combination.  We have continued the Effexor at just 75 mg a day but restarted fluoxetine 20 mg a day.  Patient was included in individual and group therapy and met with treatment team.  Patient is requesting discharge.  Denies all suicidal thought.  Has good insight and promises follow-up with outpatient providers.  Has a safe place to live.  No longer meets commitment criteria discontinue IVC and she can be discharged home today.  Physical Findings: AIMS: Facial and Oral Movements Muscles of Facial Expression: None, normal Lips and Perioral Area: None, normal Jaw: None, normal Tongue: None, normal,Extremity Movements Upper (arms, wrists, hands, fingers): None, normal Lower (legs, knees, ankles, toes): None, normal, Trunk Movements Neck, shoulders, hips: None, normal, Overall Severity Severity of abnormal movements (highest score from questions above): None, normal Incapacitation due to abnormal movements: None, normal Patient's awareness of abnormal movements (rate only patient's report): No Awareness, Dental  Status Current problems with teeth and/or dentures?: No Does patient usually wear dentures?: No  CIWA:    COWS:     Musculoskeletal: Strength & Muscle Tone: within normal limits Gait & Station: normal Patient leans: N/A   Psychiatric Specialty Exam:  Presentation  General Appearance:  Appropriate for Environment  Eye Contact: Good  Speech: Clear and Coherent  Speech Volume: Normal  Handedness: Right   Mood and Affect  Mood: Depressed  Affect: Blunt; Congruent   Thought Process  Thought Processes: Coherent  Descriptions of Associations:Intact  Orientation:Full (Time, Place and Person)  Thought Content:Paranoid Ideation  History of Schizophrenia/Schizoaffective disorder:No  Duration of Psychotic Symptoms:No data recorded Hallucinations:No data recorded Ideas of Reference:Paranoia  Suicidal Thoughts:No data recorded Homicidal Thoughts:No data recorded  Sensorium  Memory: Immediate Good  Judgment: Fair  Insight: Fair   Community education officer  Concentration: Fair  Attention Span: Fair  Recall:  Fair  Fund of Knowledge: Fair  Language: Fair   Psychomotor Activity  Psychomotor Activity:No data recorded  Assets  Assets: Desire for Improvement; Financial Resources/Insurance; Housing; Resilience; Social Support   Sleep  Sleep:No data recorded   Physical Exam: Physical Exam Vitals and nursing note reviewed.  Constitutional:      Appearance: Normal appearance.  HENT:     Head: Normocephalic and atraumatic.     Mouth/Throat:     Pharynx: Oropharynx is clear.  Eyes:     Pupils: Pupils are equal, round, and reactive to light.  Cardiovascular:     Rate and Rhythm: Normal rate and regular rhythm.  Pulmonary:     Effort: Pulmonary effort is normal.     Breath sounds: Normal breath sounds.  Abdominal:     General: Abdomen is flat.     Palpations: Abdomen is soft.  Musculoskeletal:        General: Normal range of motion.   Skin:    General: Skin is warm and dry.  Neurological:     General: No focal deficit present.     Mental Status: She is alert. Mental status is at baseline.  Psychiatric:        Attention and Perception: Attention normal.        Mood and Affect: Mood normal.        Speech: Speech normal.        Behavior: Behavior normal.        Thought Content: Thought content normal.        Cognition and Memory: Cognition normal.        Judgment: Judgment normal.    Review of Systems  Constitutional: Negative.   HENT: Negative.    Eyes: Negative.   Respiratory: Negative.    Cardiovascular: Negative.   Gastrointestinal: Negative.   Musculoskeletal: Negative.   Skin: Negative.   Neurological: Negative.   Psychiatric/Behavioral: Negative.     Blood pressure 107/81, pulse 69, temperature 97.9 F (36.6 C), temperature source Oral, resp. rate 18, height 5\' 7"  (1.702 m), weight 92 kg, last menstrual period 07/17/2012, SpO2 98 %. Body mass index is 31.77 kg/m.   Social History   Tobacco Use  Smoking Status Former   Types: Cigarettes   Quit date: 02/06/1993   Years since quitting: 29.9  Smokeless Tobacco Never   Tobacco Cessation:  N/A, patient does not currently use tobacco products   Blood Alcohol level:  Lab Results  Component Value Date   ETH <10 01/17/2023    Metabolic Disorder Labs:  No results found for: "HGBA1C", "MPG" No results found for: "PROLACTIN" Lab Results  Component Value Date   CHOL 236 (H) 01/16/2023   TRIG 142.0 01/16/2023   HDL 58.70 01/16/2023   CHOLHDL 4 01/16/2023   VLDL 28.4 01/16/2023   LDLCALC 149 (H) 01/16/2023   LDLCALC 125 (H) 12/29/2021    See Psychiatric Specialty Exam and Suicide Risk Assessment completed by Attending Physician prior to discharge.  Discharge destination:  Home  Is patient on multiple antipsychotic therapies at discharge:  No   Has Patient had three or more failed trials of antipsychotic monotherapy by history:   No  Recommended Plan for Multiple Antipsychotic Therapies: NA  Discharge Instructions     Diet - low sodium heart healthy   Complete by: As directed    Increase activity slowly   Complete by: As directed       Allergies as of 01/19/2023   No Known Allergies  Medication List     STOP taking these medications    propranolol 10 MG tablet Commonly known as: INDERAL       TAKE these medications      Indication  FLUoxetine 20 MG capsule Commonly known as: PROZAC Take 1 capsule (20 mg total) by mouth daily. Start taking on: January 20, 2023  Indication: Depression   omeprazole 40 MG capsule Commonly known as: PRILOSEC Take 1 capsule (40 mg total) by mouth daily.  Indication: Gastroesophageal Reflux Disease   rosuvastatin 5 MG tablet Commonly known as: CRESTOR Take 1 tablet (5 mg total) by mouth daily after supper. What changed: when to take this  Indication: High Amount of Fats in the Blood   venlafaxine XR 75 MG 24 hr capsule Commonly known as: Effexor XR Take 1 capsule (75 mg total) by mouth daily with breakfast.  Indication: Major Depressive Disorder         Follow-up recommendations:  Other:  Follow-up with outpatient mental health providers  Comments: Prescriptions provided  Signed: Alethia Berthold, MD 01/19/2023, 10:26 AM

## 2023-01-19 NOTE — BHH Suicide Risk Assessment (Signed)
[facility] Discharge Suicide Risk Assessment   Principal Problem: MDD (major depressive disorder), recurrent episode, moderate ([place]) Discharge Diagnoses: Principal Problem:   MDD (major depressive disorder), recurrent episode, moderate ([place])   Total Time spent with patient: 30 minutes  Musculoskeletal: Strength & Muscle Tone: within normal limits Gait & Station: normal Patient leans: N/A  Psychiatric Specialty Exam  Presentation  General Appearance:  Appropriate for Environment  Eye Contact: Good  Speech: Clear and Coherent  Speech Volume: Normal  Handedness: Right   Mood and Affect  Mood: Depressed  Duration of Depression Symptoms: Greater than two weeks  Affect: Blunt; Congruent   Thought Process  Thought Processes: Coherent  Descriptions of Associations:Intact  Orientation:Full (Time, Place and Person)  Thought Content:Paranoid Ideation  History of Schizophrenia/Schizoaffective disorder:No  Duration of Psychotic Symptoms:No data recorded Hallucinations:No data recorded Ideas of Reference:Paranoia  Suicidal Thoughts:No data recorded Homicidal Thoughts:No data recorded  Sensorium  Memory: Immediate Good  Judgment: Fair  Insight: Fair   Community education officer  Concentration: Fair  Attention Span: Fair  Recall: [place] of Knowledge: Fair  Language: Fair   Psychomotor Activity  Psychomotor Activity:No data recorded  Assets  Assets: Desire for Improvement; Financial Resources/Insurance; Housing; Resilience; Social Support   Sleep  Sleep:No data recorded  Physical Exam: Physical Exam Vitals and nursing note reviewed.  Constitutional:      Appearance: Normal appearance.  HENT:     Head: Normocephalic and atraumatic.     Mouth/Throat:     Pharynx: Oropharynx is clear.  Eyes:     Pupils: Pupils are equal, round, and reactive to light.  Cardiovascular:     Rate and Rhythm: Normal rate and regular rhythm.  Pulmonary:      Effort: Pulmonary effort is normal.     Breath sounds: Normal breath sounds.  Abdominal:     General: Abdomen is flat.     Palpations: Abdomen is soft.  Musculoskeletal:        General: Normal range of motion.  Skin:    General: Skin is warm and dry.  Neurological:     General: No focal deficit present.     Mental Status: She is alert. Mental status is at baseline.  Psychiatric:        Attention and Perception: Attention normal.        Mood and Affect: Mood normal.        Speech: Speech normal.        Behavior: Behavior normal.        Thought Content: Thought content normal.        Cognition and Memory: Cognition normal.        Judgment: Judgment normal.    Review of Systems  Constitutional: Negative.   HENT: Negative.    Eyes: Negative.   Respiratory: Negative.    Cardiovascular: Negative.   Gastrointestinal: Negative.   Musculoskeletal: Negative.   Skin: Negative.   Neurological: Negative.   Psychiatric/Behavioral: Negative.     Blood pressure 107/81, pulse 69, temperature 97.9 F (36.6 C), temperature source Oral, resp. rate 18, height 5\' 7"  (1.702 m), weight 92 kg, last menstrual period 07/17/2012, SpO2 98 %. Body mass index is 31.77 kg/m.  Mental Status Per Nursing Assessment::   On Admission:  Suicidal ideation indicated by patient  Demographic Factors:  Caucasian  Loss Factors: NA  Historical Factors: Impulsivity  Risk Reduction Factors:   Religious beliefs about death, Employed, Living with another person, especially a relative, Positive social support, Positive therapeutic relationship,  and Positive coping skills or problem solving skills  Continued Clinical Symptoms:  Depression:   Impulsivity  Cognitive Features That Contribute To Risk:  None    Suicide Risk:  Minimal: No identifiable suicidal ideation.  Patients presenting with no risk factors but with morbid ruminations; may be classified as minimal risk based on the severity of the  depressive symptoms    Plan Of Care/Follow-up recommendations:  Other:  Patient seen today.  She is appropriate in her interaction and reports that mood is feeling better and feeling stable.  She denies any suicidal thought at all.  She has tolerated medication and is agreeable to continued follow-up with her usual outpatient provider.  Patient is requesting discharge.  Does not meet commitment criteria.  Psychoeducation and supportive counseling completed.  Discharge on current medicine to outpatient treatment  Alethia Berthold, MD 01/19/2023, 10:23 AM

## 2023-01-19 NOTE — Progress Notes (Signed)
Man has been active on the unit since the start of the shift. Watching TV and interacting well with her peers.  She has no qhs medication tonight and states that she does not need anything to help aid in sleep.  She denies si/hi/avh.  Has no anxiety or depression at this encounter. Reports feeling better. Staff encouraged her to come to the nurses station if situation changes.      C Butler-Nicholson, LPN

## 2023-01-19 NOTE — Plan of Care (Signed)

## 2023-01-21 ENCOUNTER — Telehealth: Payer: Self-pay

## 2023-01-21 NOTE — Telephone Encounter (Signed)
Transition Care Management Follow-up Telephone Call Date of discharge and from where: TCM DC [facility] 01-19-23 Dx: MDD  How have you been since you were released from the hospital? Doing ok  Any questions or concerns? No  Items Reviewed: Did the pt receive and understand the discharge instructions provided? Yes  Medications obtained and verified? Yes  Other? No  Any new allergies since your discharge? No  Dietary orders reviewed? Yes Do you have support at home? Yes   Home Care and Equipment/Supplies: Were home health services ordered? no If so, what is the name of the agency? na  Has the agency set up a time to come to the patient's home? not applicable Were any new equipment or medical supplies ordered?  No What is the name of the medical supply agency? na Were you able to get the supplies/equipment? not applicable Do you have any questions related to the use of the equipment or supplies? No  Functional Questionnaire: (I = Independent and D = Dependent) ADLs: I  Bathing/Dressing- I  Meal Prep- I  Eating- I  Maintaining continence- I  Transferring/Ambulation- I  Managing Meds- I  Follow up appointments reviewed:  PCP Hospital f/u appt confirmed? No  . [facility] f/u appt confirmed? Yes  Patient has called and left message to schedule fu appt with psychiatry. Are transportation arrangements needed? No  If their condition worsens, is the pt aware to call PCP or go to the Emergency Dept.? Yes Was the patient provided with contact information for the PCP's office or ED? Yes Was to pt encouraged to call back with questions or concerns? Yes   Juanda Crumble LPN [place] Direct Dial [phone]

## 2023-01-22 ENCOUNTER — Telehealth (HOSPITAL_COMMUNITY): Payer: Self-pay | Admitting: Professional

## 2023-01-23 ENCOUNTER — Telehealth (HOSPITAL_COMMUNITY): Payer: Self-pay | Admitting: Licensed Clinical Social Worker

## 2023-01-23 ENCOUNTER — Encounter: Payer: Self-pay | Admitting: Psychiatry

## 2023-01-23 ENCOUNTER — Ambulatory Visit (INDEPENDENT_AMBULATORY_CARE_PROVIDER_SITE_OTHER): Payer: BC Managed Care – PPO | Admitting: Psychiatry

## 2023-01-23 VITALS — BP 133/81 | HR 66 | Temp 98.3°F | Ht 67.0 in | Wt 205.2 lb

## 2023-01-23 DIAGNOSIS — F419 Anxiety disorder, unspecified: Secondary | ICD-10-CM | POA: Diagnosis not present

## 2023-01-23 DIAGNOSIS — F332 Major depressive disorder, recurrent severe without psychotic features: Secondary | ICD-10-CM

## 2023-01-23 DIAGNOSIS — F640 Transsexualism: Secondary | ICD-10-CM | POA: Diagnosis not present

## 2023-01-23 MED ORDER — VENLAFAXINE HCL ER 75 MG PO CP24: 75.0000 mg | ORAL_CAPSULE | Freq: Every day | ORAL | 0 refills | Status: DC

## 2023-01-23 MED ORDER — VENLAFAXINE HCL ER 37.5 MG PO CP24: 37.5000 mg | ORAL_CAPSULE | Freq: Every day | ORAL | 0 refills | Status: DC

## 2023-01-23 NOTE — Progress Notes (Unsigned)
BH MD OP Progress Note  01/23/2023 12:05 PM Katherine Steele  MRN:  [id number]  Chief Complaint:  Chief Complaint  Patient presents with   Follow-up   Depression   Anxiety   Medication Refill   HPI: Katherine Steele is a 52 year old Caucasian female, married, employed, lives in [place], has a history of MDD, anxiety unspecified, gender dysphoria, long-term use of cannabis was evaluated in office today after her most recent inpatient behavioral health admission.  Patient with recent overdose on propranolol-30 pills of 10 mg, in a suicide attempt was admitted to [facility] behavioral health inpatient unit-01/18/2023 - 01/19/2023-medications were readjusted and patient was discharged to outpatient care.  I have reviewed notes per Dr. Toni Amend.  Patient was restarted on Prozac 20 mg and continued on venlafaxine, dosage adjusted to 75 mg daily.'  Patient today returns for a follow-up, reports she continues to struggle with depression and anxiety although she has noticed some improvement.  She is currently tolerating the Effexor well, interested in dosage readjustment.  Patient reports she is motivated to start the partial hospitalization program, has an evaluation coming up tomorrow.  Looks forward to that.  Reports she continues to work at this time, work situation seems to be better.  She has been talking to her Production designer, theatre/television/film.  Does have some relationship struggles with the peer how she is taking it 1 day at a time.  Patient with recent suicide attempt, currently denies any suicidality.  Denies any homicidality or perceptual disturbances.  Patient appeared to be alert, oriented to person place time and situation.  Patient denies any other concerns today.  Visit Diagnosis:    ICD-10-CM   1. Severe episode of recurrent major depressive disorder, without psychotic features (HCC)  F33.2 venlafaxine XR (EFFEXOR XR) 75 MG 24 hr capsule    venlafaxine XR (EFFEXOR XR) 37.5 MG 24 hr capsule    2. Anxiety  disorder, unspecified type  F41.9     3. Gender dysphoria in adult  F64.0       Past Psychiatric History: Reviewed past psychiatric history from progress note on 11/30/2022.  Past also medications like Zoloft, Wellbutrin-made her crazy, Xanax. Patient with recent suicide attempt-inpatient behavioral health admission-01/18/2023 - 01/19/2023-after an overdose on 30 pills of propranolol 10 mg.  Past Medical History:  Past Medical History:  Diagnosis Date   BMI 33.0-33.9,adult    Endometriosis    s/p hysterectomy   GAD (generalized anxiety disorder)    Dr. Lucianne Muss psychiatrist   Gender dysphoria 2014   did undergo 10 mo testosterone treatment   GERD (gastroesophageal reflux disease)    HLD (hyperlipidemia) 08/12/2014   Mild off meds    MDD (major depressive disorder)    Dr. Lucianne Muss psychiatrist   PONV (postoperative nausea and vomiting)    after finger surgery    Past Surgical History:  Procedure Laterality Date   CESAREAN SECTION     COLONOSCOPY  2007   WNL [facility])   COLONOSCOPY WITH PROPOFOL N/A 03/31/2021   WNL, rpt 10 yrs [name], [place] B, MD)   ESOPHAGOGASTRODUODENOSCOPY (EGD) WITH PROPOFOL N/A 03/21/2018   barrett's without dysplasia, mild chronic gastritis, reflux gastroesophagitis [name], [name], MD)   ESOPHAGOGASTRODUODENOSCOPY (EGD) WITH PROPOFOL N/A 03/31/2021   small HH, shatski ring, ?barretts - biopsies WNL [name], [name], MD)   FINGER SURGERY Right 2007   little finger   OOPHORECTOMY Left 2006   TONSILLECTOMY  1975   TOTAL ABDOMINAL HYSTERECTOMY  12/2012   endometriosis and ovarian  cysts s/p ovaries removed   TYMPANOSTOMY TUBE PLACEMENT Bilateral 1976    Family Psychiatric History: Reviewed family psychiatric History from progress note on 11/30/2022.  Family History:  Family History  Problem Relation Age of Onset   Diabetes Mother    Cancer Mother 67       ovarian with mets   Rheum arthritis Mother    Hypertension Father    Alcohol  abuse Father        history   Rheum arthritis Maternal Aunt    CAD Maternal Uncle 70       MI   Alcohol abuse Paternal Uncle    Suicidality Paternal Uncle    CAD Maternal Grandfather 18       MI   Alcohol abuse Cousin    Suicidality Cousin    Drug abuse Cousin    Suicidality Cousin    Anxiety disorder Son    Autism spectrum disorder Son    Stroke Neg Hx     Social History: Reviewed social history from progress note on 11/30/2022. Social History   Socioeconomic History   Marital status: Married    Spouse name: Katherine Steele   Number of children: 1   Years of education: Not on file   Highest education level: Associate degree: academic program  Occupational History   Not on file  Tobacco Use   Smoking status: Former    Types: Cigarettes    Quit date: 02/06/1993    Years since quitting: 29.9   Smokeless tobacco: Never  Vaping Use   Vaping Use: Never used  Substance and Sexual Activity   Alcohol use: Not Currently    Comment: social/weekends   Drug use: Not Currently    Types: Marijuana    Comment: stopped 2 weeks ago smoking marijuana   Sexual activity: Yes  Other Topics Concern   Not on file  Social History Narrative   Lives with husband Katherine Steele) and son Katherine Steele)   Occupation: Customer service manager   Edu: Associate's degree   Activity: exercise 3x/wk   Diet: good water, fruits/vegetables some   Social Determinants of Health   Financial Resource Strain: Not on file  Food Insecurity: No Food Insecurity (01/18/2023)   Hunger Vital Sign    Worried About [place] in the Last Year: Never true    [place] in the Last Year: Never true  Transportation Needs: No Transportation Needs (01/18/2023)   PRAPARE - Hydrologist (Medical): No    Lack of Transportation (Non-Medical): No  Physical Activity: Not on file  Stress: Not on file  Social Connections: Not on file    Allergies: No Known Allergies  Metabolic Disorder Labs: No  results found for: "HGBA1C", "MPG" No results found for: "PROLACTIN" Lab Results  Component Value Date   CHOL 236 (H) 01/16/2023   TRIG 142.0 01/16/2023   HDL 58.70 01/16/2023   CHOLHDL 4 01/16/2023   VLDL 28.4 01/16/2023   LDLCALC 149 (H) 01/16/2023   LDLCALC 125 (H) 12/29/2021   Lab Results  Component Value Date   TSH 1.793 12/20/2022   TSH 2.88 06/26/2021    Therapeutic Level Labs: No results found for: "LITHIUM" No results found for: "VALPROATE" No results found for: "CBMZ"  Current Medications: Current Outpatient Medications  Medication Sig Dispense Refill   FLUoxetine (PROZAC) 20 MG capsule Take 1 capsule (20 mg total) by mouth daily. 30 capsule 1   omeprazole (PRILOSEC) 40 MG capsule Take 1  capsule (40 mg total) by mouth daily. 30 capsule 0   rosuvastatin (CRESTOR) 5 MG tablet Take 1 tablet (5 mg total) by mouth daily after supper. 30 tablet 0   venlafaxine XR (EFFEXOR XR) 37.5 MG 24 hr capsule Take 1 capsule (37.5 mg total) by mouth daily with breakfast. Take along with 75 mg daily 30 capsule 0   venlafaxine XR (EFFEXOR XR) 75 MG 24 hr capsule Take 1 capsule (75 mg total) by mouth daily with breakfast. 30 capsule 0   No current facility-administered medications for this visit.     Musculoskeletal: Strength & Muscle Tone: within normal limits Gait & Station: normal Patient leans: N/A  Psychiatric Specialty Exam: Review of Systems  Psychiatric/Behavioral:  Positive for dysphoric mood. The patient is nervous/anxious.   All other systems reviewed and are negative.   Blood pressure 133/81, pulse 66, temperature 98.3 F (36.8 C), temperature source Oral, height 5\' 7"  (1.702 m), weight 205 lb 3.2 oz (93.1 kg), last menstrual period 07/17/2012, SpO2 97 %.Body mass index is 32.14 kg/m.  General Appearance: Casual  Eye Contact:  Fair  Speech:  Clear and Coherent  Volume:  Normal  Mood:  Anxious and Depressed  Affect:  Congruent  Thought Process:  Goal Directed and  Descriptions of Associations: Intact  Orientation:  Full (Time, Place, and Person)  Thought Content: Logical   Suicidal Thoughts:  No  Homicidal Thoughts:  No  Memory:  Immediate;   Fair Recent;   Fair Remote;   Fair  Judgement:  Fair  Insight:  Fair  Psychomotor Activity:  Normal  Concentration:  Concentration: Fair and Attention Span: Fair  Recall:  [employer] of Knowledge: Fair  Language: Fair  Akathisia:  No  Handed:  Right  AIMS (if indicated): not done  Assets:  Communication Skills Desire for [place] Talents/Skills Transportation  ADL's:  Intact  Cognition: WNL  Sleep:   improving   Screenings: AIMS    Flowsheet Row Admission (Discharged) from 01/18/2023 in [place] Total Score 0      AUDIT    [place] Admission (Discharged) from 01/18/2023 in [place]  Alcohol Use Disorder Identification Test Final Score (AUDIT) 0      GAD-7    Flowsheet Row Office Visit from 01/23/2023 in [place] Office Visit from 01/17/2023 in [place] Office Visit from 01/16/2023 in [place] [facility] [place] Visit from 11/30/2022 in [place] Office Visit from 01/16/2022 in [place] [facility]  Total GAD-7 Score 9 13 14 14  0      PHQ2-9    [place] Visit from 01/23/2023 in [place] Office Visit from 01/17/2023 in [place] Office Visit from 01/16/2023 in [facility] [place] Visit from 11/30/2022 in [place] Office Visit from 01/16/2022 in [facility] [place]  PHQ-2 Total Score 5 4 4 6  0  PHQ-9 Total Score 16 17 18 20 2        [place] Office Visit from 01/23/2023 in [place] Admission (Discharged) from 01/18/2023 in [place] ED from 01/17/2023 in [facility] Emergency Department at [place] High Risk Moderate Risk Error: Q2 is Yes,  you must answer 3, 4, and 5        Assessment and Plan: ALLIZON WOZNICK is a 52 year old female, currently employed, married, has a history of depression, gender dysphoria was evaluated in office today, status post recent inpatient behavioral health admission after suicide attempt, currently improving however patient continues to be high risk due to continued depression symptoms, recent suicide attempt, recent discharge from inpatient facility, will benefit from continued treatment, partial hospitalization program-discussed plan as noted below.  Plan MDD severe-unstable Increase venlafaxine extended release to 112.5 mg p.o. daily Continue Prozac 20 mg p.o. daily for now, will consider tapering off in the future. Patient aware of drug to drug interaction including serotonin syndrome. Refer patient for PHP-I have already coordinated care with [place] partial hospitalization program.  Anxiety disorder unspecified-unstable Dosage of the venlafaxine readjusted as noted above. Discontinue propranolol, status post recent suicide attempt by overdose on propranolol.  Gender dysphoria-improving Patient will continue to need psychotherapy, patient to establish care with therapist.   I have reviewed notes per Dr. Juliann Pulse noted above-most recent hospital admission to 02/05/2023 - 01/19/2023.  Follow-up in clinic in 4 to 8 weeks or sooner if needed.  Patient in the meantime to attend PHP program.    This note was generated in part or whole with voice recognition software. Voice recognition is usually quite accurate but there are transcription errors that can and very often do occur. I  apologize for any typographical errors that were not detected and corrected.      Ursula Alert, MD 01/23/2023, 12:05 PM

## 2023-01-24 ENCOUNTER — Ambulatory Visit (HOSPITAL_COMMUNITY): Payer: BC Managed Care – PPO | Admitting: Licensed Clinical Social Worker

## 2023-01-24 ENCOUNTER — Encounter (HOSPITAL_COMMUNITY): Payer: Self-pay

## 2023-01-24 DIAGNOSIS — F332 Major depressive disorder, recurrent severe without psychotic features: Secondary | ICD-10-CM | POA: Insufficient documentation

## 2023-01-24 NOTE — Treatment Plan (Signed)
Active     Anxiety     LTG: Katherine Steele will score less than 5 on the Generalized Anxiety Disorder 7 Scale (GAD-7)  (Initial)     Start:  01/24/23    Expected End:  04/24/23           OP Depression     LTG: Increase coping skills to manage depression and improve ability to perform daily activities (Initial)     Start:  01/24/23    Expected End:  04/24/23         STG: Katherine Steele will attend at least 80% of scheduled PHP sessions    (Initial)     Start:  01/24/23    Expected End:  04/24/23         STG: Katherine Steele will attend at least 80% of scheduled group psychotherapy sessions  (Initial)     Start:  01/24/23    Expected End:  04/24/23         STG: Katherine Steele will complete at least 80% of assigned homework  (Initial)     Start:  01/24/23    Expected End:  04/24/23         STG: Katherine Steele will identify cognitive patterns and beliefs that support depression (Initial)     Start:  01/24/23    Expected End:  04/24/23          Pt agrees to tx plan

## 2023-01-24 NOTE — Psych (Signed)
Virtual Visit via Video Note  I connected with Katherine Steele on 01/24/23 at 10:00 AM EST by a video enabled telemedicine application and verified that I am speaking with the correct person using two identifiers.  Location: Patient: pt's car, confirmed she is stationary Provider: clinical home office   I discussed the limitations of evaluation and management by telemedicine and the availability of in person appointments. The patient expressed understanding and agreed to proceed.   I discussed the assessment and treatment plan with the patient. The patient was provided an opportunity to ask questions and all were answered. The patient agreed with the plan and demonstrated an understanding of the instructions.   The patient was advised to call back or seek an in-person evaluation if the symptoms worsen or if the condition fails to improve as anticipated.  I provided 67 minutes of non-face-to-face time during this encounter.   Wyvonnia Lora, [place]   Comprehensive Clinical Assessment (CCA) Note  01/24/2023 DYNESHIA MITRO [id number]  Chief Complaint:  Chief Complaint  Patient presents with   Anxiety   Depression   Suicidal   Visit Diagnosis: MDD, Anxiety    CCA Screening, Triage and Referral (STR)  Patient Reported Information How did you hear about [name]? Other (Comment)  Referral name: Psychiatrist- Dr. Elna Breslow  Referral phone number: No data recorded  Whom do you see for routine medical problems? Primary Care  Practice/Facility Name: [place] [facility] or [facility]  Practice/Facility Phone Number: No data recorded Name of Contact: No data recorded Contact Number: No data recorded Contact Fax Number: No data recorded Prescriber Name: No data recorded Prescriber Address (if known): No data recorded  What Is the Reason for Your Visit/Call Today? worsening anxiety  How Long Has This Been Causing You Problems? > than 6 months  What Do You Feel Would Help You  the Most Today? Treatment for Depression or other mood problem   Have You Recently Been in Any Inpatient Treatment (Hospital/Detox/Crisis Center/28-Day Program)? Yes  Name/Location of Program/Hospital:[facility]  How Long Were You There? Two nights  When Were You Discharged? 01/26/23   Have You Ever Received Services From [employer] Before? Yes  Who Do You See at [facility]? No data recorded  Have You Recently Had Any Thoughts About Hurting Yourself? Yes  Are You Planning to Commit Suicide/Harm Yourself At This time? No   Have you Recently Had Thoughts About Hurting Someone Karolee Ohs? No  Explanation: No data recorded  Have You Used Any Alcohol or Drugs in the Past 24 Hours? No  How Long Ago Did You Use Drugs or Alcohol? No data recorded What Did You Use and How Much? No data recorded  Do You Currently Have a Therapist/Psychiatrist? Yes  Name of Therapist/Psychiatrist: Dr. Elna Breslow,   Have You Been Recently Discharged From Any Office Practice or Programs? No  Explanation of Discharge From Practice/Program: No data recorded    CCA Screening Triage Referral Assessment Type of Contact: Tele-Assessment  Is this Initial or Reassessment? Initial Assessment  Date Telepsych consult ordered in [facility]:  No data recorded Time Telepsych consult ordered in [facility]:  No data recorded  Patient Reported Information Reviewed? No data recorded Patient Left Without Being Seen? No data recorded Reason for Not Completing Assessment: No data recorded  Collateral Involvement: chart review   Does Patient Have a Court Appointed Legal Guardian? No data recorded Name and Contact of Legal Guardian: No data recorded If Minor and Not Living with Parent(s), Who  has Custody? No data recorded Is CPS involved or ever been involved? Never  Is APS involved or ever been involved? Never   Patient Determined To Be At Risk for Harm To Self or Others Based on Review of Patient Reported Information or  Presenting Complaint? No  Method: No data recorded Availability of Means: No data recorded Intent: No data recorded Notification Required: No data recorded Additional Information for Danger to Others Potential: No data recorded Additional Comments for Danger to Others Potential: No data recorded Are There Guns or Other Weapons in Your Home? Yes  Types of Guns/Weapons: husband has a Theme park manager Secured?                            Yes  Who Could Verify You Are Able To Have These Secured: husband  Do You Have any Outstanding Charges, Pending Court Dates, Parole/Probation? No data recorded Contacted To Inform of Risk of Harm To Self or Others: No data recorded  Location of Assessment: Other (comment)   Does Patient Present under Involuntary Commitment? No  IVC Papers Initial File Date: No data recorded  [place] of Residence: [place]   Patient Currently Receiving the Following Services: Not Receiving Services; Medication Management   Determination of Need: Routine (7 days)   Options For Referral: Partial Hospitalization     CCA Biopsychosocial Intake/Chief Complaint:  Ryna Beckstrom is a 52yo female referred to [facility] by Dr. Elna Breslow after a brief hospitalization following a suicide attempt via overdose. She cites her stressors as people in general, finances, and her identity as a trans female who chooses not to transition or use female pronouns. She states she struggles with reconciling her identity with her strong [place] faith. She reports decreased ADLs in taking care of household chores, but states they are improving. She reports she previously saw a therapist at [facility] in [place] but stopped because she is searching for a Librarian, academic. She endorses two psychiatric hospitalizations, one at age [age] and one a week ago, both of which followed a suicide attempt by intentional overdose. She reports she takes Prozac and Effexor and is diagnosed with GAD and  MDD. She denies SI at this time. She denies HI, AVH, recent substance use and history of substance abuse, medical diagnoses, and NSSI. She reports family history of alcohol abuse, depression, and three completed suicides, all on her father's side. She cites her husband and 21yo son as her supports, both of whom she lives with. She reports she previously smoked marijuana but stopped 2-3 months ago due to increased paranoia and obsessive thoughts with use. She states there is a firearm in the home that her husband has secured.  Current Symptoms/Problems: Obsessive negative thoughts, ruminating, thinking about what ifs, difficulty sleeping (5-6 hours), "becoming standoffish at work," anger issues, feeling paranoid at work like everyone is against her   Patient Reported Schizophrenia/Schizoaffective Diagnosis in Past: No   Strengths: motivation for tx  Preferences: open to PHP  Abilities: able to engage in tx   Type of Services Patient Feels are Needed: improvement in functioning and reduction in symptoms   Initial Clinical Notes/Concerns: No data recorded  Mental Health Symptoms Depression:   Change in energy/activity; Difficulty Concentrating; Fatigue; Hopelessness; Irritability; Sleep (too much or little); Tearfulness; Worthlessness   Duration of Depressive symptoms:  Greater than two weeks   Mania:   None   Anxiety:    Difficulty concentrating; Fatigue; Irritability;  Restlessness; Worrying   Psychosis:   None   Duration of Psychotic symptoms: No data recorded  Trauma:   None   Obsessions:   Cause anxiety; Disrupts routine/functioning; Intrusive/time consuming   Compulsions:   None   Inattention:   None   Hyperactivity/Impulsivity:   None   Oppositional/Defiant Behaviors:   None   Emotional Irregularity:   None   Other Mood/Personality Symptoms:  No data recorded   Mental Status Exam Appearance and self-care  Stature:   Average   Weight:   Average  weight   Clothing:   Casual   Grooming:   Normal   Cosmetic use:   None   Posture/gait:   Normal   Motor activity:   Not Remarkable   Sensorium  Attention:   Normal   Concentration:   Normal   Orientation:   X5   Recall/memory:   Normal   Affect and Mood  Affect:   Blunted   Mood:   Depressed   Relating  Eye contact:   Normal   Facial expression:   Depressed   Attitude toward examiner:   Cooperative   Thought and Language  Speech flow:  Clear and Coherent   Thought content:   Appropriate to Mood and Circumstances   Preoccupation:   None   Hallucinations:   None   Organization:  goal-directed  [employer] of Knowledge:   Average   Intelligence:   Average   Abstraction:   Normal   Judgement:   Fair   Art therapist:   Adequate   [employer]ight:   Fair   Decision Making:   Normal   Social Functioning  Social Maturity:   Responsible   Social Judgement:   Normal   Stress  Stressors:   Illness; Work; Teacher, music Ability:   Overwhelmed; Deficient supports   Skill Deficits:   Activities of daily living; Self-care; Interpersonal   Supports:   Family     Religion: Religion/Spirituality Are You A Religious Person?: Yes What is Your Religious Affiliation?: Christian How Might This Affect Treatment?: pt reports she is seeking a Editor, commissioning for outpatient therapy  Leisure/Recreation: Leisure / [place]?: Yes Leisure and Hobbies: "I don't really do a whole lot of hobbies. Play games on ipad and watch tv. Walk, I like to walk but don't know if you'd count that as a hobby."  Exercise/Diet: Exercise/Diet Do You Exercise?: No Have You Gained or Lost A Significant Amount of Weight in the Past Six Months?: No Do You Follow a Special Diet?: No Do You Have Any Trouble Sleeping?: Yes Explanation of Sleeping Difficulties: decreased sleep   CCA  Employment/Education Employment/Work Situation: Employment / Work Situation Employment Situation: Employed Where is Patient Currently Employed?: [employer] How Long has Patient Been Employed?: 15 years Are You Satisfied With Your Job?: Yes Work Stressors: Paranoia and obsessive thoughts at work. Not having enough engineering support. Patient's Job has Been Impacted by Current Illness: Yes Describe how Patient's Job has Been Impacted: "It has recently. I went through some medication changes because I was having a difficult time with focus and concentration." What is the Longest Time Patient has Held a Job?: 15 years Where was the Patient Employed at that Time?: [employer] Has Patient ever Been in the [employer]?: No  Education: Education Is Patient Currently Attending School?: No Last Grade Completed: 12 Did Teacher, adult education From [employer]?: Yes Did [place]?: No Did [place]?:  No Did You Have An Individualized Education Program (IIEP): No Did You Have Any Difficulty At School?: No Patient's Education Has Been Impacted by Current Illness: No   CCA Family/Childhood History Family and Relationship History: Family history Marital status: Married Number of Years Married: 72 What types of issues is patient dealing with in the relationship?: "Intimacy issues but outside of that we're like best friends." Additional relationship information: N/A Are you sexually active?: No What is your sexual orientation?: "I'm heterosexual but I'm trans." Declines to share pronouns outside of she. Has your sexual activity been affected by drugs, alcohol, medication, or emotional stress?: Unable to assess Does patient have children?: Yes How many children?: 1 How is patient's relationship with their children?: "I'd say it's pretty good."  Childhood History:  Childhood History By whom was/is the patient raised?: Both parents Additional childhood history information: "It was good except  my parents got divorced." Pt shares that they divorced when she was in the fourth grade and her and her brothers remained with their mother until she got married when pt was in the sixth grade. She states that they returned to her father's home after the marriage until the 11th grade when her and her twin brother returned to mother while her older brother remained with her father. Description of patient's relationship with caregiver when they were a child: "It was really good." Patient's description of current relationship with people who raised him/her: Mother and stepfather are deceased. Father: It's good, we just don't see eachother as much as we should. He spends a lot of time taking care of my stepmother." How were you disciplined when you got in trouble as a child/adolescent?: "We were spanked." Does patient have siblings?: Yes Number of Siblings: 2 Description of patient's current relationship with siblings: "Good, really good. We've always been close." Did patient suffer any verbal/emotional/physical/sexual abuse as a child?: No Did patient suffer from severe childhood neglect?: No Has patient ever been sexually abused/assaulted/raped as an adolescent or adult?: No Was the patient ever a victim of a crime or a disaster?: No Witnessed domestic violence?: No Has patient been affected by domestic violence as an adult?: No  Child/Adolescent Assessment:     CCA Substance Use Alcohol/Drug Use: Alcohol / Drug Use History of alcohol / drug use?: No history of alcohol / drug abuse                         ASAM's:  Six Dimensions of Multidimensional Assessment  Dimension 1:  Acute Intoxication and/or Withdrawal Potential:      Dimension 2:  Biomedical Conditions and Complications:      Dimension 3:  Emotional, Behavioral, or Cognitive Conditions and Complications:     Dimension 4:  Readiness to Change:     Dimension 5:  Relapse, Continued use, or Continued Problem Potential:      Dimension 6:  Recovery/Living Environment:     ASAM Severity Score:    ASAM Recommended Level of Treatment:     Substance use Disorder (SUD)    Recommendations for Services/Supports/Treatments:    DSM5 Diagnoses: Patient Active Problem List   Diagnosis Date Noted   MDD (major depressive disorder), recurrent episode, severe ([place]) 01/24/2023   Gender dysphoria 11/30/2022   Long term current use of cannabis 11/30/2022   High risk medication use 11/30/2022   Anxiety attack 09/28/2022   Adult attention deficit disorder 02/14/2021   Obesity, Class I, BMI 30-34.9    Surgical  menopause, asymptomatic 12/09/2018   Schatzki's ring    Barrett's esophagus    Gastroesophageal reflux disease    Esophageal dysphagia    Hiatal hernia    Acquired trigger finger 09/19/2016   HLD (hyperlipidemia) 08/12/2014   Health maintenance examination 05/04/2014   Gender dysphoria in adult 07/17/2012   Anxiety disorder 02/07/2012   MDD (major depressive disorder), recurrent episode, moderate ([place]) 02/07/2012    Patient Centered Plan: Patient is on the following Treatment Plan(s):  Anxiety and Depression   Referrals to Alternative Service(s): Referred to Alternative Service(s):   Place:   Date:   Time:    Referred to Alternative Service(s):   Place:   Date:   Time:    Referred to Alternative Service(s):   Place:   Date:   Time:    Referred to Alternative Service(s):   Place:   Date:   Time:      Collaboration of Care: Psychiatrist AEB referred by Dr. Shea Evans  Patient/Guardian was advised Release of Information must be obtained prior to any record release in order to collaborate their care with an outside provider. Patient/Guardian was advised if they have not already done so to contact the registration department to sign all necessary forms in order for [name] to release information regarding their care.   Consent: Patient/Guardian gives verbal consent for treatment and assignment of benefits for  services provided during this visit. Patient/Guardian expressed understanding and agreed to proceed.   Heron Nay, LCSWA

## 2023-01-28 ENCOUNTER — Encounter: Payer: Self-pay | Admitting: Family Medicine

## 2023-01-29 ENCOUNTER — Other Ambulatory Visit (HOSPITAL_COMMUNITY): Payer: BC Managed Care – PPO | Attending: Psychiatry

## 2023-01-29 ENCOUNTER — Other Ambulatory Visit (HOSPITAL_COMMUNITY): Payer: BC Managed Care – PPO | Attending: Psychiatry | Admitting: Licensed Clinical Social Worker

## 2023-01-29 DIAGNOSIS — F332 Major depressive disorder, recurrent severe without psychotic features: Secondary | ICD-10-CM | POA: Insufficient documentation

## 2023-01-29 DIAGNOSIS — F649 Gender identity disorder, unspecified: Secondary | ICD-10-CM | POA: Diagnosis not present

## 2023-01-29 DIAGNOSIS — Z79899 Other long term (current) drug therapy: Secondary | ICD-10-CM | POA: Diagnosis not present

## 2023-01-29 DIAGNOSIS — F640 Transsexualism: Secondary | ICD-10-CM | POA: Diagnosis not present

## 2023-01-29 DIAGNOSIS — F419 Anxiety disorder, unspecified: Secondary | ICD-10-CM | POA: Insufficient documentation

## 2023-01-29 DIAGNOSIS — F411 Generalized anxiety disorder: Secondary | ICD-10-CM | POA: Insufficient documentation

## 2023-01-29 DIAGNOSIS — R4589 Other symptoms and signs involving emotional state: Secondary | ICD-10-CM

## 2023-01-29 MED ORDER — FLUOXETINE HCL 10 MG PO CAPS: 20.0000 mg | ORAL_CAPSULE | Freq: Every day | ORAL | 0 refills | Status: DC

## 2023-01-29 MED ORDER — ROSUVASTATIN CALCIUM 10 MG PO TABS: 10.0000 mg | ORAL_TABLET | Freq: Every day | ORAL | 3 refills | Status: DC
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
## 2023-01-30 ENCOUNTER — Other Ambulatory Visit (HOSPITAL_COMMUNITY): Payer: BC Managed Care – PPO | Admitting: Licensed Clinical Social Worker

## 2023-01-30 ENCOUNTER — Other Ambulatory Visit (HOSPITAL_COMMUNITY): Payer: BC Managed Care – PPO

## 2023-01-30 ENCOUNTER — Encounter (HOSPITAL_COMMUNITY): Payer: Self-pay

## 2023-01-30 DIAGNOSIS — Z79899 Other long term (current) drug therapy: Secondary | ICD-10-CM | POA: Diagnosis not present

## 2023-01-30 DIAGNOSIS — F411 Generalized anxiety disorder: Secondary | ICD-10-CM

## 2023-01-30 DIAGNOSIS — F649 Gender identity disorder, unspecified: Secondary | ICD-10-CM | POA: Diagnosis not present

## 2023-01-30 DIAGNOSIS — F332 Major depressive disorder, recurrent severe without psychotic features: Secondary | ICD-10-CM

## 2023-01-30 DIAGNOSIS — R4589 Other symptoms and signs involving emotional state: Secondary | ICD-10-CM

## 2023-01-30 DIAGNOSIS — F640 Transsexualism: Secondary | ICD-10-CM | POA: Diagnosis not present

## 2023-01-30 DIAGNOSIS — F419 Anxiety disorder, unspecified: Secondary | ICD-10-CM | POA: Diagnosis not present

## 2023-01-31 ENCOUNTER — Encounter (HOSPITAL_COMMUNITY): Payer: Self-pay

## 2023-01-31 ENCOUNTER — Other Ambulatory Visit (HOSPITAL_COMMUNITY): Payer: BC Managed Care – PPO

## 2023-01-31 ENCOUNTER — Other Ambulatory Visit (HOSPITAL_COMMUNITY): Payer: BC Managed Care – PPO | Admitting: Licensed Clinical Social Worker

## 2023-01-31 DIAGNOSIS — Z79899 Other long term (current) drug therapy: Secondary | ICD-10-CM | POA: Diagnosis not present

## 2023-01-31 DIAGNOSIS — F332 Major depressive disorder, recurrent severe without psychotic features: Secondary | ICD-10-CM | POA: Diagnosis not present

## 2023-01-31 DIAGNOSIS — F419 Anxiety disorder, unspecified: Secondary | ICD-10-CM | POA: Diagnosis not present

## 2023-01-31 DIAGNOSIS — F411 Generalized anxiety disorder: Secondary | ICD-10-CM | POA: Diagnosis not present

## 2023-01-31 DIAGNOSIS — F649 Gender identity disorder, unspecified: Secondary | ICD-10-CM | POA: Diagnosis not present

## 2023-01-31 DIAGNOSIS — F640 Transsexualism: Secondary | ICD-10-CM | POA: Diagnosis not present

## 2023-01-31 DIAGNOSIS — R4589 Other symptoms and signs involving emotional state: Secondary | ICD-10-CM

## 2023-01-31 NOTE — Therapy (Signed)
[place], [place], [address] Phone: [phone]   Fax:  [phone]  Occupational Therapy Treatment Virtual Visit via Video Note  I connected with Katherine Steele on 01/31/23 at  8:00 AM EST by a video enabled telemedicine application and verified that I am speaking with the correct person using two identifiers.  Location: Patient: home Provider: office   I discussed the limitations of evaluation and management by telemedicine and the availability of in person appointments. The patient expressed understanding and agreed to proceed.    The patient was advised to call back or seek an in-person evaluation if the symptoms worsen or if the condition fails to improve as anticipated.  I provided 55 minutes of non-face-to-face time during this encounter.   Patient Details  Name: Katherine Steele MRN: [id number] Date of Birth: 29-Dec-1970 No data recorded  Encounter Date: 01/30/2023   OT End of Session - 01/31/23 1931     Visit Number 2    Number of Visits 20    Date for OT Re-Evaluation 03/02/23    OT Start Time 1200    OT Stop Time [id number]    OT Time Calculation (min) 55 min             Past Medical History:  Diagnosis Date   BMI 33.0-33.9,adult    Endometriosis    s/p hysterectomy   GAD (generalized anxiety disorder)    Dr. Annitta Jersey psychiatrist   Gender dysphoria 2014   did undergo 10 mo testosterone treatment   GERD (gastroesophageal reflux disease)    HLD (hyperlipidemia) 08/12/2014   Mild off meds    MDD (major depressive disorder)    Dr. Annitta Jersey psychiatrist   PONV (postoperative nausea and vomiting)    after finger surgery    Past Surgical History:  Procedure Laterality Date   CESAREAN SECTION     COLONOSCOPY  2007   WNL [facility])   COLONOSCOPY WITH PROPOFOL N/A 03/31/2021   WNL, rpt 10 yrs Bonna Gains, Barlow B, MD)   ESOPHAGOGASTRODUODENOSCOPY (EGD) WITH PROPOFOL N/A 03/21/2018    barrett's without dysplasia, mild chronic gastritis, reflux gastroesophagitis Bonna Gains, [name], MD)   ESOPHAGOGASTRODUODENOSCOPY (EGD) WITH PROPOFOL N/A 03/31/2021   small HH, shatski ring, ?barretts - biopsies WNL Bonna Gains, Lennette Bihari, MD)   FINGER SURGERY Right 2007   little finger   OOPHORECTOMY Left 2006   TONSILLECTOMY  1975   TOTAL ABDOMINAL HYSTERECTOMY  12/2012   endometriosis and ovarian cysts s/p ovaries removed   TYMPANOSTOMY TUBE PLACEMENT Bilateral 1976    There were no vitals filed for this visit.   Subjective Assessment - 01/31/23 1930     Currently in Pain? No/denies    Pain Score 0-No pain                Group Session:  S: Doing a bit better today. Feeling safe today being here.  O: The session centered on the neuroscientific underpinnings of motivation and its inverse relationship with procrastination. Through a review of current scientific literature, the group examined:  The function of dopamine as a critical neurotransmitter in the motivation circuitry of the brain. The physiological and psychological aspects of procrastination, including the impact of dopamine on the tendency to delay tasks. Strategies for leveraging an understanding of dopamine's role to enhance motivation, address procrastination, and foster productive habits. The objective was to provide a framework for participants to understand the neurochemical dynamics of motivation, relate these  to personal experiences of procrastination, and apply evidence-based methods to improve focus and drive in both professional and personal contexts.   A: The patient demonstrates a strong grasp of the material presented on the neurobiology of motivation and its implications for procrastination. They actively participated in discussions, showing an ability to connect scientific concepts to personal experiences. The patient exhibits insight into their own behavior patterns and expresses a keen  interest in applying the strategies discussed to mitigate tendencies towards procrastination. The patient's engagement level indicates a readiness to implement behavioral changes that may enhance personal productivity and reduce procrastination.    P: Continue to attend PHP OT group sessions 5x week for 4 weeks to promote daily structure, social engagement, and opportunities to develop and utilize adaptive strategies to maximize functional performance in preparation for safe transition and integration back into school, work, and the community. Plan to address topic of tbd in next OT group session.                   OT Education - 01/31/23 1930     Education Details Procrastination 4              OT Short Term Goals - 01/31/23 1907       OT SHORT TERM GOAL #1   Title Client will develop and utilize a personalized coping toolbox containing at least five coping strategies to manage challenging situations, demonstrating their use in real-life scenarios by the end of therapy.    Time 4    Period Weeks    Status On-going    Target Date 03/02/23      OT SHORT TERM GOAL #2   Title By discharge, client will demonstrate the ability to adapt and modify routines when faced with unexpected events or changes, maintaining a balanced approach to daily tasks.      OT SHORT TERM GOAL #3   Title By the time of discharge, client will independently set, track, and make progress towards a long-term goal, demonstrating resilience in overcoming obstacles and seeking support when needed.                      Plan - 01/31/23 1931     Psychosocial Skills Interpersonal Interaction;Routines and Behaviors;Habits;Coping Strategies             Patient will benefit from skilled therapeutic intervention in order to improve the following deficits and impairments:       Psychosocial Skills: Interpersonal Interaction, Routines and Behaviors, Habits, Coping Strategies   Visit  Diagnosis: Difficulty coping    Problem List Patient Active Problem List   Diagnosis Date Noted   MDD (major depressive disorder), recurrent episode, severe ([place]) 01/24/2023   Gender dysphoria 11/30/2022   Long term current use of cannabis 11/30/2022   High risk medication use 11/30/2022   Anxiety attack 09/28/2022   Adult attention deficit disorder 02/14/2021   Obesity, Class I, BMI 30-34.9    Surgical menopause, asymptomatic 12/09/2018   Schatzki's ring    Barrett's esophagus    Gastroesophageal reflux disease    Esophageal dysphagia    Hiatal hernia    Acquired trigger finger 09/19/2016   HLD (hyperlipidemia) 08/12/2014   Health maintenance examination 05/04/2014   Gender dysphoria in adult 07/17/2012   Anxiety disorder 02/07/2012   MDD (major depressive disorder), recurrent episode, moderate ([place]) 02/07/2012    Brantley Stage, OT 01/31/2023, 7:32 PM  [facility], [place]  [facility]PITAL[facility] PRO[facility] [address], [place], [address] Phone: [phone]   Fax:  [phone]  Name: Katherine Steele MRN: [id number] Date of Birth: 1971/01/14

## 2023-01-31 NOTE — Therapy (Signed)
[place], [place], [address] Phone: [phone]   Fax:  [phone]  Occupational Therapy Evaluation Virtual Visit via Video Note  I connected with [name] on 01/31/23 at  8:00 AM EST by a video enabled telemedicine application and verified that I am speaking with the correct person using two identifiers.  Location: Patient: home Provider: office   I discussed the limitations of evaluation and management by telemedicine and the availability of in person appointments. The patient expressed understanding and agreed to proceed.    The patient was advised to call back or seek an in-person evaluation if the symptoms worsen or if the condition fails to improve as anticipated.  I provided 85 minutes of non-face-to-face time during this encounter.   Patient Details  Name: Katherine Steele MRN: [id number] Date of Birth: Aug 07, 1971 No data recorded  Encounter Date: 01/29/2023   OT End of Session - 01/31/23 1904     Visit Number 1    Number of Visits 20    Date for OT Re-Evaluation 03/02/23    OT Start Time 0930    OT Stop Time [id number]   Eval: 59; Tx: 55   OT Time Calculation (min) 205 min    Activity Tolerance Patient tolerated treatment well             Past Medical History:  Diagnosis Date   BMI 33.0-33.9,adult    Endometriosis    s/p hysterectomy   GAD (generalized anxiety disorder)    Dr. Annitta [place] psychiatrist   Gender dysphoria 2014   did undergo 10 mo testosterone treatment   GERD (gastroesophageal reflux disease)    HLD (hyperlipidemia) 08/12/2014   Mild off meds    MDD (major depressive disorder)    Dr. Annitta Jersey psychiatrist   PONV (postoperative nausea and vomiting)    after finger surgery    Past Surgical History:  Procedure Laterality Date   CESAREAN SECTION     COLONOSCOPY  2007   WNL [facility])   COLONOSCOPY WITH PROPOFOL N/A 03/31/2021   WNL, rpt 10 yrs Bonna Gains,  [place] B, MD)   ESOPHAGOGASTRODUODENOSCOPY (EGD) WITH PROPOFOL N/A 03/21/2018   barrett's without dysplasia, mild chronic gastritis, reflux gastroesophagitis Bonna Gains, [name], MD)   ESOPHAGOGASTRODUODENOSCOPY (EGD) WITH PROPOFOL N/A 03/31/2021   small HH, shatski ring, ?barretts - biopsies WNL Bonna Gains, Lennette Bihari, MD)   FINGER SURGERY Right 2007   little finger   OOPHORECTOMY Left 2006   TONSILLECTOMY  1975   TOTAL ABDOMINAL HYSTERECTOMY  12/2012   endometriosis and ovarian cysts s/p ovaries removed   TYMPANOSTOMY TUBE PLACEMENT Bilateral 1976    There were no vitals filed for this visit.   Subjective Assessment - 01/31/23 1900     Subjective  Looking forward to leanring new skills to help me with communication and coping in my daily life.    Pertinent History MDD    Patient Stated Goals Improve coping skills, routines, habits and goals    Currently in Pain? No/denies    Pain Score 0-No pain    Multiple Pain Sites No                Group Session:  S: Looking forward to learning new way to cope and manage my daily life and work life.  O: The session centered on the neuroscientific underpinnings of motivation and its inverse relationship with procrastination. Through a review of current scientific literature, the group examined:  The function of dopamine as a critical neurotransmitter in the motivation circuitry of the brain. The physiological and psychological aspects of procrastination, including the impact of dopamine on the tendency to delay tasks. Strategies for leveraging an understanding of dopamine's role to enhance motivation, address procrastination, and foster productive habits. The objective was to provide a framework for participants to understand the neurochemical dynamics of motivation, relate these to personal experiences of procrastination, and apply evidence-based methods to improve focus and drive in both professional and personal contexts.   A:  The patient demonstrates a strong grasp of the material presented on the neurobiology of motivation and its implications for procrastination. They actively participated in discussions, showing an ability to connect scientific concepts to personal experiences. The patient exhibits insight into their own behavior patterns and expresses a keen interest in applying the strategies discussed to mitigate tendencies towards procrastination. The patient's engagement level indicates a readiness to implement behavioral changes that may enhance personal productivity and reduce procrastination.    P: Continue to attend PHP OT group sessions 5x week for 4 weeks to promote daily structure, social engagement, and opportunities to develop and utilize adaptive strategies to maximize functional performance in preparation for safe transition and integration back into school, work, and the community. Plan to address topic of tbd in next OT group session.    OT Assessment  Diagnosis: MDD Past medical history/referral information:MDD Living situation: husband / son ADLs: independent Work: fulltime employment / FMLA Leisure: in deficits Social support:  fair Struggles: communications, goals, routines and habits / social behavior  OT goal:  improve deficits that inhibit participation in these psychosocial domains  [place] Summary of Client Scores:  Facilitates participation in occupation Allows participation in occupation Inhibits participation in occupation Restricts participation in occupation Comments:  Roles   X    Habits   X    Personal Causation  X     Values  X     Interests   X    Skills   X    [place]   X    Long-term Goals   X    Interpretation of Past Experiences   X    Physical Environment  X     Social Environment  X     Readiness for Change   X      Need for Occupational Therapy:  4 Shows positive occupational participation, no need for OT.   3 Need for  minimal intervention/consultative participation   2 Need for OT intervention indicated to restore/improve participation   1 Need for extensive OT intervention indicated to improve participation.  Referral for follow up services also recommended.   Assessment:  Patient demonstrates behavior that INHIBITS participation in occupation.  Patient will benefit from occupational therapy intervention in order to improve time management, financial management, stress management, job readiness skills, social skills, and health management skills in preparation to return to full time community living and to be a productive community member.    Plan:  Patient will participate in skilled occupational therapy sessions individually or in a group setting to improve coping skills, psychosocial skills, and emotional skills required to return to prior level of function. Treatment will be 4-5 times per week for 4 weeks.                 OT Education - 01/31/23 1904     Education Details OT Evaluation / OCAIRS / OT Tx    Person(s) Educated  Patient    Methods Explanation;Handout    Comprehension Verbalized understanding              OT Short Term Goals - 01/31/23 1907       OT SHORT TERM GOAL #1   Title Client will develop and utilize a personalized coping toolbox containing at least five coping strategies to manage challenging situations, demonstrating their use in real-life scenarios by the end of therapy.    Time 4    Period Weeks    Status On-going    Target Date 03/02/23      OT SHORT TERM GOAL #2   Title By discharge, client will demonstrate the ability to adapt and modify routines when faced with unexpected events or changes, maintaining a balanced approach to daily tasks.      OT SHORT TERM GOAL #3   Title By the time of discharge, client will independently set, track, and make progress towards a long-term goal, demonstrating resilience in overcoming obstacles and seeking support when  needed.                      Plan - 01/31/23 1905     Clinical Impression Statement Pt presents w/ deficits across multiple psychosocial domains to the extent that they inhibit participation in daily self care, work and community re-entry    OT Occupational Profile and History Problem Focused Assessment - Including review of records relating to presenting problem    Occupational performance deficits (Please refer to evaluation for details): IADL's;Rest and Sleep;Work;Leisure;Social Participation    Psychosocial Skills Interpersonal Interaction;Routines and Behaviors;Habits;Coping Strategies    Rehab Potential Good    Clinical Decision Making Limited treatment options, no task modification necessary    Comorbidities Affecting Occupational Performance: None    Modification or Assistance to Complete Evaluation  No modification of tasks or assist necessary to complete eval    OT Frequency 5x / week    OT Duration 4 weeks    OT Treatment/Interventions Psychosocial skills training;Coping strategies training    Consulted and Agree with Plan of Care Patient             Patient will benefit from skilled therapeutic intervention in order to improve the following deficits and impairments:       Psychosocial Skills: Interpersonal Interaction, Routines and Behaviors, Habits, Coping Strategies   Visit Diagnosis: Difficulty coping    Problem List Patient Active Problem List   Diagnosis Date Noted   MDD (major depressive disorder), recurrent episode, severe ([place]) 01/24/2023   Gender dysphoria 11/30/2022   Long term current use of cannabis 11/30/2022   High risk medication use 11/30/2022   Anxiety attack 09/28/2022   Adult attention deficit disorder 02/14/2021   Obesity, Class I, BMI 30-34.9    Surgical menopause, asymptomatic 12/09/2018   Schatzki's ring    Barrett's esophagus    Gastroesophageal reflux disease    Esophageal dysphagia    Hiatal hernia    Acquired  trigger finger 09/19/2016   HLD (hyperlipidemia) 08/12/2014   Health maintenance examination 05/04/2014   Gender dysphoria in adult 07/17/2012   Anxiety disorder 02/07/2012   MDD (major depressive disorder), recurrent episode, moderate ([place]) 02/07/2012    Brantley Stage, OT 01/31/2023, 7:10 PM  [facility], [place], [place], [address] Phone: [phone]   Fax:  [phone]  Name: JIANNA GIUDICE MRN: [id number] Date of Birth: 1971/04/07

## 2023-02-01 ENCOUNTER — Other Ambulatory Visit (HOSPITAL_COMMUNITY): Payer: BC Managed Care – PPO | Admitting: Licensed Clinical Social Worker

## 2023-02-01 ENCOUNTER — Encounter (HOSPITAL_COMMUNITY): Payer: Self-pay

## 2023-02-01 ENCOUNTER — Other Ambulatory Visit (HOSPITAL_COMMUNITY): Payer: BC Managed Care – PPO

## 2023-02-01 DIAGNOSIS — F332 Major depressive disorder, recurrent severe without psychotic features: Secondary | ICD-10-CM | POA: Diagnosis not present

## 2023-02-01 DIAGNOSIS — Z79899 Other long term (current) drug therapy: Secondary | ICD-10-CM | POA: Diagnosis not present

## 2023-02-01 DIAGNOSIS — F640 Transsexualism: Secondary | ICD-10-CM | POA: Diagnosis not present

## 2023-02-01 DIAGNOSIS — F649 Gender identity disorder, unspecified: Secondary | ICD-10-CM | POA: Diagnosis not present

## 2023-02-01 DIAGNOSIS — F411 Generalized anxiety disorder: Secondary | ICD-10-CM

## 2023-02-01 DIAGNOSIS — F419 Anxiety disorder, unspecified: Secondary | ICD-10-CM | POA: Diagnosis not present

## 2023-02-01 DIAGNOSIS — R4589 Other symptoms and signs involving emotional state: Secondary | ICD-10-CM

## 2023-02-01 NOTE — Progress Notes (Unsigned)
Spoke with patient via WebEx video call, used 2 identifiers to correctly identify patient. States that groups are going well. This is her first time in PHP as recommended by her psychiatrist after a suicide attempt. She took a handful of anxiety pills but woke up the next day. Was having a lot of work stress and not being able to let go of an issue that happened at work. Realized her medications are not working for her. Would like to discuss increasing her Effexor as it seems to be helping more. On scale 1-10 as 10 being worst she rates depression at 5 and anxiety at 4. Denies SI/HI or AV hallucinations. PHQ9=8. No issues or complaints.

## 2023-02-02 ENCOUNTER — Encounter (HOSPITAL_COMMUNITY): Payer: Self-pay

## 2023-02-02 NOTE — Therapy (Signed)
[place], [place], [address] Phone: [phone]   Fax:  [phone]  Occupational Therapy Treatment Virtual Visit via Video Note  I connected with Katherine Steele on 02/02/23 at  8:00 AM EST by a video enabled telemedicine application and verified that I am speaking with the correct person using two identifiers.  Location: Patient: home Provider: office   I discussed the limitations of evaluation and management by telemedicine and the availability of in person appointments. The patient expressed understanding and agreed to proceed.    The patient was advised to call back or seek an in-person evaluation if the symptoms worsen or if the condition fails to improve as anticipated.  I provided 55 minutes of non-face-to-face time during this encounter.   Patient Details  Name: Katherine Steele MRN: [id number] Date of Birth: 26-Mar-1971 No data recorded  Encounter Date: 01/31/2023   OT End of Session - 02/02/23 1140     Visit Number 3    Number of Visits 20    Date for OT Re-Evaluation 03/02/23    OT Start Time 1200    OT Stop Time 1255    OT Time Calculation (min) 55 min             Past Medical History:  Diagnosis Date   BMI 33.0-33.9,adult    Endometriosis    s/p hysterectomy   GAD (generalized anxiety disorder)    Dr. Annitta Jersey psychiatrist   Gender dysphoria 2014   did undergo 10 mo testosterone treatment   GERD (gastroesophageal reflux disease)    HLD (hyperlipidemia) 08/12/2014   Mild off meds    MDD (major depressive disorder)    Dr. Annitta Jersey psychiatrist   PONV (postoperative nausea and vomiting)    after finger surgery    Past Surgical History:  Procedure Laterality Date   CESAREAN SECTION     COLONOSCOPY  2007   WNL [facility])   COLONOSCOPY WITH PROPOFOL N/A 03/31/2021   WNL, rpt 10 yrs Bonna Gains, [place] B, MD)   ESOPHAGOGASTRODUODENOSCOPY (EGD) WITH PROPOFOL N/A 03/21/2018    barrett's without dysplasia, mild chronic gastritis, reflux gastroesophagitis Bonna Gains, [name], MD)   ESOPHAGOGASTRODUODENOSCOPY (EGD) WITH PROPOFOL N/A 03/31/2021   small HH, shatski ring, ?barretts - biopsies WNL Bonna Gains, Lennette Bihari, MD)   FINGER SURGERY Right 2007   little finger   OOPHORECTOMY Left 2006   TONSILLECTOMY  1975   TOTAL ABDOMINAL HYSTERECTOMY  12/2012   endometriosis and ovarian cysts s/p ovaries removed   TYMPANOSTOMY TUBE PLACEMENT Bilateral 1976    There were no vitals filed for this visit.   Subjective Assessment - 02/02/23 1140     Currently in Pain? No/denies    Pain Score 0-No pain               Group Session:  S: Doing better today.   O: In this group therapy session, the objective was to explore the multifaceted concept of purpose. The participants engaged in a deep exploration of the innate human impulse for purposeful engagement and the impact of purpose on mental and emotional well-being. The discussion centered around the challenges of modern life, including feelings of isolation despite technological advancements, the connection between purposelessness and depression/anxiety, and the importance of occupational roles in achieving fulfillment. Strategies were shared to identify areas of life where purpose is lacking and to develop immediate and actionable plans to overcome these challenges. The participants gained valuable insights into the  intersection of purpose and mental health, as well as practical tools to navigate their personal journeys towards [place] lives. Overall, the session fostered a sense of empowerment and inspired the participants to take proactive steps towards aligning their lives with their true purpose.   A: Patient demonstrated a high level of engagement and active participation. They actively contributed to the discussions, sharing personal insights and experiences related to the concept of purpose. Patient  demonstrated a clear understanding of the relationship between purpose and mental well-being, acknowledging the importance of aligning actions with values and setting meaningful goals. They actively engaged in self-reflection exercises and expressed a commitment to incorporating strategies discussed in the session into their daily life. Patient exhibited a strong motivation to cultivate purpose and showed a willingness to take proactive steps towards living a more purposeful and fulfilling life. Overall, Patient's active participation and enthusiasm contributed to a positive and collaborative therapeutic environment.   P: Continue to attend PHP OT group sessions 5x week for 4 weeks to promote daily structure, social engagement, and opportunities to develop and utilize adaptive strategies to maximize functional performance in preparation for safe transition and integration back into school, work, and the community. Plan to address topic of pt 2 in next OT group session.                    OT Education - 02/02/23 1140     Education Details Purpose 1              OT Short Term Goals - 01/31/23 1907       OT SHORT TERM GOAL #1   Title Client will develop and utilize a personalized coping toolbox containing at least five coping strategies to manage challenging situations, demonstrating their use in real-life scenarios by the end of therapy.    Time 4    Period Weeks    Status On-going    Target Date 03/02/23      OT SHORT TERM GOAL #2   Title By discharge, client will demonstrate the ability to adapt and modify routines when faced with unexpected events or changes, maintaining a balanced approach to daily tasks.      OT SHORT TERM GOAL #3   Title By the time of discharge, client will independently set, track, and make progress towards a long-term goal, demonstrating resilience in overcoming obstacles and seeking support when needed.                      Plan -  02/02/23 1140     Psychosocial Skills Interpersonal Interaction;Routines and Behaviors;Habits;Coping Strategies             Patient will benefit from skilled therapeutic intervention in order to improve the following deficits and impairments:       Psychosocial Skills: Interpersonal Interaction, Routines and Behaviors, Habits, Coping Strategies   Visit Diagnosis: Difficulty coping    Problem List Patient Active Problem List   Diagnosis Date Noted   MDD (major depressive disorder), recurrent episode, severe ([place]) 01/24/2023   Gender dysphoria 11/30/2022   Long term current use of cannabis 11/30/2022   High risk medication use 11/30/2022   Anxiety attack 09/28/2022   Adult attention deficit disorder 02/14/2021   Obesity, Class I, BMI 30-34.9    Surgical menopause, asymptomatic 12/09/2018   Schatzki's ring    Barrett's esophagus    Gastroesophageal reflux disease    Esophageal dysphagia    Hiatal hernia  Acquired trigger finger 09/19/2016   HLD (hyperlipidemia) 08/12/2014   Health maintenance examination 05/04/2014   Gender dysphoria in adult 07/17/2012   Anxiety disorder 02/07/2012   MDD (major depressive disorder), recurrent episode, moderate ([place]) 02/07/2012    Brantley Stage, OT 02/02/2023, 11:41 AM  Cornell Barman, OT   [facility] PROGRAM [place], [place], [address] Phone: [phone]   Fax:  [phone]  Name: AJSA GODIN MRN: [id number] Date of Birth: [birth year]

## 2023-02-04 ENCOUNTER — Other Ambulatory Visit (HOSPITAL_COMMUNITY): Payer: BC Managed Care – PPO

## 2023-02-04 ENCOUNTER — Encounter (HOSPITAL_COMMUNITY): Payer: Self-pay

## 2023-02-04 ENCOUNTER — Other Ambulatory Visit (HOSPITAL_COMMUNITY): Payer: BC Managed Care – PPO | Admitting: Licensed Clinical Social Worker

## 2023-02-04 DIAGNOSIS — F411 Generalized anxiety disorder: Secondary | ICD-10-CM | POA: Diagnosis not present

## 2023-02-04 DIAGNOSIS — F649 Gender identity disorder, unspecified: Secondary | ICD-10-CM | POA: Diagnosis not present

## 2023-02-04 DIAGNOSIS — R4589 Other symptoms and signs involving emotional state: Secondary | ICD-10-CM

## 2023-02-04 DIAGNOSIS — F640 Transsexualism: Secondary | ICD-10-CM | POA: Diagnosis not present

## 2023-02-04 DIAGNOSIS — F332 Major depressive disorder, recurrent severe without psychotic features: Secondary | ICD-10-CM

## 2023-02-04 DIAGNOSIS — F419 Anxiety disorder, unspecified: Secondary | ICD-10-CM | POA: Diagnosis not present

## 2023-02-04 DIAGNOSIS — Z79899 Other long term (current) drug therapy: Secondary | ICD-10-CM | POA: Diagnosis not present

## 2023-02-04 MED ORDER — ARIPIPRAZOLE 2 MG PO TABS: 2.0000 mg | ORAL_TABLET | Freq: Every day | ORAL | 1 refills | Status: DC

## 2023-02-04 NOTE — Therapy (Signed)
[place], [place], [address] Phone: [phone]   Fax:  [phone]  Occupational Therapy Treatment Virtual Visit via Video Note  I connected with [name] on 02/04/23 at  8:00 AM EST by a video enabled telemedicine application and verified that I am speaking with the correct person using two identifiers.  Location: Patient: home Provider: office   I discussed the limitations of evaluation and management by telemedicine and the availability of in person appointments. The patient expressed understanding and agreed to proceed.    The patient was advised to call back or seek an in-person evaluation if the symptoms worsen or if the condition fails to improve as anticipated.  I provided 55 minutes of non-face-to-face time during this encounter.   Patient Details  Name: NEMESIS KOVALSKY MRN: [id number] Date of Birth: 1971/03/01 No data recorded  Encounter Date: 02/04/2023   OT End of Session - 02/04/23 1651     Visit Number 5    Number of Visits 20    Date for OT Re-Evaluation 03/02/23    OT Start Time 1200    OT Stop Time 1255    OT Time Calculation (min) 55 min             Past Medical History:  Diagnosis Date   BMI 33.0-33.9,adult    Endometriosis    s/p hysterectomy   GAD (generalized anxiety disorder)    Dr. Annitta [place] psychiatrist   Gender dysphoria 2014   did undergo 10 mo testosterone treatment   GERD (gastroesophageal reflux disease)    HLD (hyperlipidemia) 08/12/2014   Mild off meds    MDD (major depressive disorder)    Dr. Annitta Jersey psychiatrist   PONV (postoperative nausea and vomiting)    after finger surgery    Past Surgical History:  Procedure Laterality Date   CESAREAN SECTION     COLONOSCOPY  2007   WNL [facility])   COLONOSCOPY WITH PROPOFOL N/A 03/31/2021   WNL, rpt 10 yrs Bonna Gains, Jefferson B, MD)   ESOPHAGOGASTRODUODENOSCOPY (EGD) WITH PROPOFOL N/A 03/21/2018    barrett's without dysplasia, mild chronic gastritis, reflux gastroesophagitis Bonna Gains, [name], MD)   ESOPHAGOGASTRODUODENOSCOPY (EGD) WITH PROPOFOL N/A 03/31/2021   small HH, shatski ring, ?barretts - biopsies WNL Bonna Gains, Lennette Bihari, MD)   FINGER SURGERY Right 2007   little finger   OOPHORECTOMY Left 2006   TONSILLECTOMY  1975   TOTAL ABDOMINAL HYSTERECTOMY  12/2012   endometriosis and ovarian cysts s/p ovaries removed   TYMPANOSTOMY TUBE PLACEMENT Bilateral 1976    There were no vitals filed for this visit.   Subjective Assessment - 02/04/23 1651     Currently in Pain? No/denies    Pain Score 0-No pain               Group Session:  S: doing better today  O: In this group therapy session, the objective was to explore the multifaceted concept of purpose. The participants engaged in a deep exploration of the innate human impulse for purposeful engagement and the impact of purpose on mental and emotional well-being. The discussion centered around the challenges of modern life, including feelings of isolation despite technological advancements, the connection between purposelessness and depression/anxiety, and the importance of occupational roles in achieving fulfillment. Strategies were shared to identify areas of life where purpose is lacking and to develop immediate and actionable plans to overcome these challenges. The participants gained valuable insights into the intersection  of purpose and mental health, as well as practical tools to navigate their personal journeys towards [place] lives. Overall, the session fostered a sense of empowerment and inspired the participants to take proactive steps towards aligning their lives with their true purpose.   A: Patient demonstrated a high level of engagement and active participation. They actively contributed to the discussions, sharing personal insights and experiences related to the concept of purpose. Patient  demonstrated a clear understanding of the relationship between purpose and mental well-being, acknowledging the importance of aligning actions with values and setting meaningful goals. They actively engaged in self-reflection exercises and expressed a commitment to incorporating strategies discussed in the session into their daily life. Patient exhibited a strong motivation to cultivate purpose and showed a willingness to take proactive steps towards living a more purposeful and fulfilling life. Overall, Patient's active participation and enthusiasm contributed to a positive and collaborative therapeutic environment.   P: Continue to attend PHP OT group sessions 5x week for 4 weeks to promote daily structure, social engagement, and opportunities to develop and utilize adaptive strategies to maximize functional performance in preparation for safe transition and integration back into school, work, and the community. Plan to address topic of tbd in next OT group session.                    OT Education - 02/04/23 1651     Education Details Purpose              OT Short Term Goals - 01/31/23 1907       OT SHORT TERM GOAL #1   Title Client will develop and utilize a personalized coping toolbox containing at least five coping strategies to manage challenging situations, demonstrating their use in real-life scenarios by the end of therapy.    Time 4    Period Weeks    Status On-going    Target Date 03/02/23      OT SHORT TERM GOAL #2   Title By discharge, client will demonstrate the ability to adapt and modify routines when faced with unexpected events or changes, maintaining a balanced approach to daily tasks.      OT SHORT TERM GOAL #3   Title By the time of discharge, client will independently set, track, and make progress towards a long-term goal, demonstrating resilience in overcoming obstacles and seeking support when needed.                      Plan -  02/04/23 1652     Occupational performance deficits (Please refer to evaluation for details): IADL's;Rest and Sleep;Work;Leisure;Social Participation             Patient will benefit from skilled therapeutic intervention in order to improve the following deficits and impairments:           Visit Diagnosis: Difficulty coping    Problem List Patient Active Problem List   Diagnosis Date Noted   MDD (major depressive disorder), recurrent episode, severe ([place]) 01/24/2023   Gender dysphoria 11/30/2022   Long term current use of cannabis 11/30/2022   High risk medication use 11/30/2022   Anxiety attack 09/28/2022   Adult attention deficit disorder 02/14/2021   Obesity, Class I, BMI 30-34.9    Surgical menopause, asymptomatic 12/09/2018   Schatzki's ring    Barrett's esophagus    Gastroesophageal reflux disease    Esophageal dysphagia    Hiatal hernia    Acquired trigger finger 09/19/2016  HLD (hyperlipidemia) 08/12/2014   Health maintenance examination 05/04/2014   Gender dysphoria in adult 07/17/2012   Anxiety disorder 02/07/2012   MDD (major depressive disorder), recurrent episode, moderate ([place]) 02/07/2012    Brantley Stage, OT 02/04/2023, 4:52 PM  Cornell Barman, OT   [facility] HOSPITALIZATION PROGRAM [place], [place], [address] Phone: [phone]   Fax:  [phone]  Name: HAYLIEGH RUDZINSKI MRN: [id number] Date of Birth: 09/07/1971

## 2023-02-04 NOTE — Therapy (Signed)
[place], [place], [address] Phone: [phone]   Fax:  [phone]  Occupational Therapy Treatment Virtual Visit via Video Note  I connected with [name] on 02/04/23 at  8:00 AM EST by a video enabled telemedicine application and verified that I am speaking with the correct person using two identifiers.  Location: Patient: home Provider: office   I discussed the limitations of evaluation and management by telemedicine and the availability of in person appointments. The patient expressed understanding and agreed to proceed.    The patient was advised to call back or seek an in-person evaluation if the symptoms worsen or if the condition fails to improve as anticipated.  I provided 55 minutes of non-face-to-face time during this encounter.   Patient Details  Name: Katherine Steele MRN: [id number] Date of Birth: May 19, 1971 No data recorded  Encounter Date: 02/01/2023   OT End of Session - 02/04/23 1641     Visit Number 4    Number of Visits 20    Date for OT Re-Evaluation 03/02/23    OT Start Time 1200    OT Stop Time 1255    OT Time Calculation (min) 55 min             Past Medical History:  Diagnosis Date   BMI 33.0-33.9,adult    Endometriosis    s/p hysterectomy   GAD (generalized anxiety disorder)    Dr. Annitta Jersey psychiatrist   Gender dysphoria 2014   did undergo 10 mo testosterone treatment   GERD (gastroesophageal reflux disease)    HLD (hyperlipidemia) 08/12/2014   Mild off meds    MDD (major depressive disorder)    Dr. Annitta Jersey psychiatrist   PONV (postoperative nausea and vomiting)    after finger surgery    Past Surgical History:  Procedure Laterality Date   CESAREAN SECTION     COLONOSCOPY  2007   WNL [facility])   COLONOSCOPY WITH PROPOFOL N/A 03/31/2021   WNL, rpt 10 yrs Bonna Gains, [place] B, MD)   ESOPHAGOGASTRODUODENOSCOPY (EGD) WITH PROPOFOL N/A 03/21/2018    barrett's without dysplasia, mild chronic gastritis, reflux gastroesophagitis Bonna Gains, [name], MD)   ESOPHAGOGASTRODUODENOSCOPY (EGD) WITH PROPOFOL N/A 03/31/2021   small HH, shatski ring, ?barretts - biopsies WNL Bonna Gains, Lennette Bihari, MD)   FINGER SURGERY Right 2007   little finger   OOPHORECTOMY Left 2006   TONSILLECTOMY  1975   TOTAL ABDOMINAL HYSTERECTOMY  12/2012   endometriosis and ovarian cysts s/p ovaries removed   TYMPANOSTOMY TUBE PLACEMENT Bilateral 1976    There were no vitals filed for this visit.   Subjective Assessment - 02/04/23 1641     Currently in Pain? No/denies    Pain Score 0-No pain                 Group Session:  S: Doing better today  O: In this group therapy session, the objective was to explore the multifaceted concept of purpose. The participants engaged in a deep exploration of the innate human impulse for purposeful engagement and the impact of purpose on mental and emotional well-being. The discussion centered around the challenges of modern life, including feelings of isolation despite technological advancements, the connection between purposelessness and depression/anxiety, and the importance of occupational roles in achieving fulfillment. Strategies were shared to identify areas of life where purpose is lacking and to develop immediate and actionable plans to overcome these challenges. The participants gained valuable insights into  the intersection of purpose and mental health, as well as practical tools to navigate their personal journeys towards [place] lives. Overall, the session fostered a sense of empowerment and inspired the participants to take proactive steps towards aligning their lives with their true purpose.   A: Patient demonstrated a high level of engagement and active participation. They actively contributed to the discussions, sharing personal insights and experiences related to the concept of purpose. Patient  demonstrated a clear understanding of the relationship between purpose and mental well-being, acknowledging the importance of aligning actions with values and setting meaningful goals. They actively engaged in self-reflection exercises and expressed a commitment to incorporating strategies discussed in the session into their daily life. Patient exhibited a strong motivation to cultivate purpose and showed a willingness to take proactive steps towards living a more purposeful and fulfilling life. Overall, Patient's active participation and enthusiasm contributed to a positive and collaborative therapeutic environment.   P: Continue to attend PHP OT group sessions 5x week for 4 weeks to promote daily structure, social engagement, and opportunities to develop and utilize adaptive strategies to maximize functional performance in preparation for safe transition and integration back into school, work, and the community. Plan to address topic of purpose in next OT group session.                  OT Education - 02/04/23 1641     Education Details Purpose              OT Short Term Goals - 01/31/23 1907       OT SHORT TERM GOAL #1   Title Client will develop and utilize a personalized coping toolbox containing at least five coping strategies to manage challenging situations, demonstrating their use in real-life scenarios by the end of therapy.    Time 4    Period Weeks    Status On-going    Target Date 03/02/23      OT SHORT TERM GOAL #2   Title By discharge, client will demonstrate the ability to adapt and modify routines when faced with unexpected events or changes, maintaining a balanced approach to daily tasks.      OT SHORT TERM GOAL #3   Title By the time of discharge, client will independently set, track, and make progress towards a long-term goal, demonstrating resilience in overcoming obstacles and seeking support when needed.                      Plan -  02/04/23 1641     Psychosocial Skills Interpersonal Interaction;Routines and Behaviors;Habits;Coping Strategies             Patient will benefit from skilled therapeutic intervention in order to improve the following deficits and impairments:       Psychosocial Skills: Interpersonal Interaction, Routines and Behaviors, Habits, Coping Strategies   Visit Diagnosis: Difficulty coping    Problem List Patient Active Problem List   Diagnosis Date Noted   MDD (major depressive disorder), recurrent episode, severe ([place]) 01/24/2023   Gender dysphoria 11/30/2022   Long term current use of cannabis 11/30/2022   High risk medication use 11/30/2022   Anxiety attack 09/28/2022   Adult attention deficit disorder 02/14/2021   Obesity, Class I, BMI 30-34.9    Surgical menopause, asymptomatic 12/09/2018   Schatzki's ring    Barrett's esophagus    Gastroesophageal reflux disease    Esophageal dysphagia    Hiatal hernia    Acquired trigger finger  09/19/2016   HLD (hyperlipidemia) 08/12/2014   Health maintenance examination 05/04/2014   Gender dysphoria in adult 07/17/2012   Anxiety disorder 02/07/2012   MDD (major depressive disorder), recurrent episode, moderate ([place]) 02/07/2012    Brantley Stage, OT 02/04/2023, 4:42 PM  Cornell Barman, OT   [facility] HOSPITALIZATION PROGRAM [place], [place], [address] Phone: [phone]   Fax:  [phone]  Name: Katherine Steele MRN: [id number] Date of Birth: 30-Oct-1971

## 2023-02-04 NOTE — Progress Notes (Unsigned)
Spoke with patient via WebEx video call, used 2 identifiers to correctly identify patient. States that groups are going good. No issues with medication. On scale 1-10 as 10 being worst she rates depression at 4 and anxiety at 3/4. Denies SI/HI or AV hallucinations. No issues or complaints. Pleasant with bright affect.

## 2023-02-04 NOTE — Psych (Signed)
Virtual Visit via Video Note  I connected with Katherine Steele on 02/01/23 at  9:00 AM EST by a video enabled telemedicine application and verified that I am speaking with the correct person using two identifiers.  Location: Patient: patient home Provider: clinical home office   I discussed the limitations of evaluation and management by telemedicine and the availability of in person appointments. The patient expressed understanding and agreed to proceed.  I discussed the assessment and treatment plan with the patient. The patient was provided an opportunity to ask questions and all were answered. The patient agreed with the plan and demonstrated an understanding of the instructions.   The patient was advised to call back or seek an in-person evaluation if the symptoms worsen or if the condition fails to improve as anticipated.  Pt was provided 240 minutes of non-face-to-face time during this encounter.   Lorin Glass, LCSW   [facility] PHP THERAPIST PROGRESS NOTE  Katherine Steele [id number]  Session Time: 9:00- 10:00  Participation Level: Active  Behavioral Response: CasualAlertDepressed  Type of Therapy: Group Therapy  Treatment Goals addressed: Coping  Progress Towards Goals: Initial  Interventions: CBT, DBT, Supportive, and Reframing  Summary: Katherine Steele is a 52 y.o. female who presents with depression and anxiety symptoms.  Clinician led check-in regarding current stressors and situation, and review of patient completed daily inventory. Clinician utilized active listening and empathetic response and validated patient emotions. Clinician facilitated processing group on pertinent issues.?    Therapist Response: Patient arrived within time allowed. Patient rates her mood at a 7 on a scale of 1-10 with 10 being best. Pt states she feels "okay." Pt states she slept 8 hours and ate 3x. Pt reports ruminating about work issues and wanting to be angry, however feeling more hurt.  Pt reports she wants to be able to detach herself, but instead takes things personally.  Patient able to process. Patient engaged in discussion.             Session Time: 10:00 am - 11:00 am   Participation Level: Active   Behavioral Response: CasualAlertDepressed   Type of Therapy: Group Therapy   Treatment Goals addressed: Coping   Progress Towards Goals: Progressing   Interventions: CBT, DBT, Solution Focused, Strength-based, Supportive, and Reframing   Therapist Response: Cln led discussion on guilt and the way it impacts [name]. Cln utilized CBT cognitive distortion: emotional reasoning to inform discussion. Cln encouraged pt's to consider whether the guilt was founded as a first step to address the feeling. Group members discussed feelings of guilt and worked to determine whether those feelings were founded in truth or feeling.    Therapist Response: Pt engaged in discussion and is able to offer a guilt example and work through it with the group.            Session Time: 11:00 -12:00   Participation Level: Active   Behavioral Response: CasualAlertDepressed   Type of Therapy: Group Therapy   Treatment Goals addressed: Coping   Progress Towards Goals: Progressing   Interventions: CBT, DBT, Solution Focused, Strength-based, Supportive, and Reframing   Summary: Cln led discussion on ways to manage stressors and feelings over the weekend. Group members  brainstormed things to do over the weekend for multiple levels of energy, access, and moods. Cln reviewed crisis services should they be needed and provided pt's with the text crisis line, mobile crisis, national suicide hotline, [facility] 24/7 line, and information on [facility]gent [facility].  Therapist Response: Pt engaged in discussion and is able to identify 3 ideas of what to do over the weekend to keep their mind engaged.            Session Time: 12:00 -1:00   Participation Level: Active   Behavioral Response:  CasualAlertDepressed   Type of Therapy: Group therapy, Occupational Therapy   Treatment Goals addressed: Coping   Progress Towards Goals: Progressing   Interventions: Supportive; Psychoeducation   Summary: 12:00 - 12:50: Occupational Therapy group led by cln E. Hollan. 12:50 - 1:00 Clinician assessed for immediate needs, medication compliance and efficacy, and safety concerns.   Therapist Response: 12:00 - 12:50: See OT note 12:50 - 1:00 pm: At check-out, patient reports no immediate concerns. Patient demonstrates progress as evidenced by continued engagement in group and responsiveness to treatment. Patient denies SI/HI/self-harm thoughts at the end of group.    Suicidal/Homicidal: Nowithout intent/plan  Plan: Pt will continue in PHP while working to decrease depression and anxiety symptoms, decrease negative response to stressors, and increase ability to manage symptoms in a healthy manner.   Collaboration of Care: Medication Management AEB A [place]  Patient/Guardian was advised Release of Information must be obtained prior to any record release in order to collaborate their care with an outside provider. Patient/Guardian was advised if they have not already done so to contact the registration department to sign all necessary forms in order for [name] to release information regarding their care.   Consent: Patient/Guardian gives verbal consent for treatment and assignment of benefits for services provided during this visit. Patient/Guardian expressed understanding and agreed to proceed.   Diagnosis: Severe episode of recurrent major depressive disorder, without psychotic features ([place]) [F33.2]    1. Severe episode of recurrent major depressive disorder, without psychotic features ([place])   2. GAD (generalized anxiety disorder)       Lorin Glass, LCSW 02/04/2023

## 2023-02-04 NOTE — Psych (Signed)
Virtual Visit via Video Note  I connected with Katherine Steele on 01/29/23 at  9:00 AM EST by a video enabled telemedicine application and verified that I am speaking with the correct person using two identifiers.  Location: Patient: patient home Provider: clinical home office   I discussed the limitations of evaluation and management by telemedicine and the availability of in person appointments. The patient expressed understanding and agreed to proceed.  I discussed the assessment and treatment plan with the patient. The patient was provided an opportunity to ask questions and all were answered. The patient agreed with the plan and demonstrated an understanding of the instructions.   The patient was advised to call back or seek an in-person evaluation if the symptoms worsen or if the condition fails to improve as anticipated.  Pt was provided 240 minutes of non-face-to-face time during this encounter.   Lorin Glass, LCSW   [facility] BH PHP THERAPIST PROGRESS NOTE  Katherine Steele [id number]  Session Time: 9:00- 10:00  Participation Level: Active  Behavioral Response: CasualAlertDepressed  Type of Therapy: Group Therapy  Treatment Goals addressed: Coping  Progress Towards Goals: Initial  Interventions: CBT, DBT, Supportive, and Reframing  Summary: Katherine Steele is a 52 y.o. female who presents with depression and anxiety symptoms.  Clinician led check-in regarding current stressors and situation, and review of patient completed daily inventory. Clinician utilized active listening and empathetic response and validated patient emotions. Clinician facilitated processing group on pertinent issues.?    Therapist Response: Patient arrived within time allowed. Patient rates her mood at a 6 on a scale of 1-10 with 10 being best. Pt states she feels "good." Pt states she slept 7.5 hours and ate 3x. Pt reports she is in a good place today because she had yesterday off work and is not  there today either. Pt states work is her biggest stressor. Pt reports struggles with self esteem, self-doubt, "caring too much what other people think," and depression.  Patient able to process. Patient engaged in discussion.             Session Time: 10:00 am - 11:00 am   Participation Level: Active   Behavioral Response: CasualAlertDepressed   Type of Therapy: Group Therapy   Treatment Goals addressed: Coping   Progress Towards Goals: Progressing   Interventions: CBT, DBT, Solution Focused, Strength-based, Supportive, and Reframing   Therapist Response: Cln led discussion on personal standards and they way in which it impacts the way we view ourselves and our abilities. Group members discussed judgment, struggles, and barriers they experience in terms of personal standards. Cln brought in topics of balance, grace, and kindness.   Therapist Response: Pt engaged in discussion and is able to discuss how to adapt standards.            Session Time: 11:00 -12:00   Participation Level: Active   Behavioral Response: CasualAlertDepressed   Type of Therapy: Group Therapy   Treatment Goals addressed: Coping   Progress Towards Goals: Progressing   Interventions: CBT, DBT, Solution Focused, Strength-based, Supportive, and Reframing   Summary: Cln continued topic of CBT cognitive distortions and utilized handout "Unhealthy Thought Patterns "to review common examples of distorted thought to increase awareness of the distorted thoughts.   Therapist Response: Pt engaged in discussion and is able to make connections from her life to the distorted thoughts.             Session Time: 12:00 -1:00   Participation Level: Active  Behavioral Response: CasualAlertDepressed   Type of Therapy: Group therapy, Occupational Therapy   Treatment Goals addressed: Coping   Progress Towards Goals: Progressing   Interventions: Supportive; Psychoeducation   Summary: 12:00 - 12:50:  Occupational Therapy group led by cln E. Hollan. 12:50 - 1:00 Clinician assessed for immediate needs, medication compliance and efficacy, and safety concerns.   Therapist Response: 12:00 - 12:50: See OT note 12:50 - 1:00 pm: At check-out, patient reports no immediate concerns. Patient demonstrates progress as evidenced by continued engagement in group and responsiveness to treatment. Patient denies SI/HI/self-harm thoughts at the end of group.    Suicidal/Homicidal: Nowithout intent/plan  Plan: Pt will continue in PHP while working to decrease depression and anxiety symptoms, decrease negative response to stressors, and increase ability to manage symptoms in a healthy manner.   Collaboration of Care: Medication Management AEB A Pashayan  Patient/Guardian was advised Release of Information must be obtained prior to any record release in order to collaborate their care with an outside provider. Patient/Guardian was advised if they have not already done so to contact the registration department to sign all necessary forms in order for [name] to release information regarding their care.   Consent: Patient/Guardian gives verbal consent for treatment and assignment of benefits for services provided during this visit. Patient/Guardian expressed understanding and agreed to proceed.   Diagnosis: Severe episode of recurrent major depressive disorder, without psychotic features ([place]) [F33.2]    1. Severe episode of recurrent major depressive disorder, without psychotic features ([place])   2. GAD (generalized anxiety disorder)   3. Gender dysphoria in adult       Media, [place] 02/04/2023

## 2023-02-04 NOTE — Psych (Signed)
Virtual Visit via Video Note  I connected with Katherine Steele on 01/30/23 at  9:00 AM EST by a video enabled telemedicine application and verified that I am speaking with the correct person using two identifiers.  Location: Patient: patient home Provider: clinical home office   I discussed the limitations of evaluation and management by telemedicine and the availability of in person appointments. The patient expressed understanding and agreed to proceed.  I discussed the assessment and treatment plan with the patient. The patient was provided an opportunity to ask questions and all were answered. The patient agreed with the plan and demonstrated an understanding of the instructions.   The patient was advised to call back or seek an in-person evaluation if the symptoms worsen or if the condition fails to improve as anticipated.  Pt was provided 240 minutes of non-face-to-face time during this encounter.   Lorin Glass, LCSW   [facility] BH PHP THERAPIST PROGRESS NOTE  Katherine Steele [id number]  Session Time: 9:00- 10:00  Participation Level: Active  Behavioral Response: CasualAlertDepressed  Type of Therapy: Group Therapy  Treatment Goals addressed: Coping  Progress Towards Goals: Initial  Interventions: CBT, DBT, Supportive, and Reframing  Summary: Katherine Steele is a 52 y.o. female who presents with depression and anxiety symptoms.  Clinician led check-in regarding current stressors and situation, and review of patient completed daily inventory. Clinician utilized active listening and empathetic response and validated patient emotions. Clinician facilitated processing group on pertinent issues.?    Therapist Response: Patient arrived within time allowed. Patient rates her mood at a 6 on a scale of 1-10 with 10 being best. Pt states she feels "okay." Pt states she slept 8 hours and ate 3x. Pt reports she continues to feel better simply by not being at work. Pt reports difficulty  managing stressors that are present at work and becomes overwhelmed and in her head quickly. Pt states struggle with her husband understanding her mental health. Patient able to process. Patient engaged in discussion.             Session Time: 10:00 am - 11:00 am   Participation Level: Active   Behavioral Response: CasualAlertDepressed   Type of Therapy: Group Therapy   Treatment Goals addressed: Coping   Progress Towards Goals: Progressing   Interventions: CBT, DBT, Solution Focused, Strength-based, Supportive, and Reframing   Therapist Response: Cln led processing group for pt's current struggles. Group members shared stressors and provided support and feedback. Cln brought in topics of boundaries, healthy relationships, and unhealthy thought processes to inform discussion.    Therapist Response:  Pt able to process and provide support to group.          Session Time: 11:00 -12:00   Participation Level: Active   Behavioral Response: CasualAlertDepressed   Type of Therapy: Group Therapy, Spiritual Care   Treatment Goals addressed: Coping   Progress Towards Goals: Progressing   Interventions: Supportive, Education   Summary:  Katherine Steele, Chaplain, led group.   Therapist Response: Pt participated           Session Time: 12:00 -1:00   Participation Level: Active   Behavioral Response: CasualAlertDepressed   Type of Therapy: Group therapy, Occupational Therapy   Treatment Goals addressed: Coping   Progress Towards Goals: Progressing   Interventions: Supportive; Psychoeducation   Summary: 12:00 - 12:50: Occupational Therapy group led by cln E. Hollan. 12:50 - 1:00 Clinician assessed for immediate needs, medication compliance and efficacy, and safety concerns.  Therapist Response: 12:00 - 12:50: See OT note 12:50 - 1:00 pm: At check-out, patient reports no immediate concerns. Patient demonstrates progress as evidenced by continued engagement in group  and responsiveness to treatment. Patient denies SI/HI/self-harm thoughts at the end of group.    Suicidal/Homicidal: Nowithout intent/plan  Plan: Pt will continue in PHP while working to decrease depression and anxiety symptoms, decrease negative response to stressors, and increase ability to manage symptoms in a healthy manner.   Collaboration of Care: Medication Management AEB A Pashayan  Patient/Guardian was advised Release of Information must be obtained prior to any record release in order to collaborate their care with an outside provider. Patient/Guardian was advised if they have not already done so to contact the registration department to sign all necessary forms in order for [name] to release information regarding their care.   Consent: Patient/Guardian gives verbal consent for treatment and assignment of benefits for services provided during this visit. Patient/Guardian expressed understanding and agreed to proceed.   Diagnosis: Severe episode of recurrent major depressive disorder, without psychotic features ([place]) [F33.2]    1. Severe episode of recurrent major depressive disorder, without psychotic features ([place])   2. GAD (generalized anxiety disorder)       Lorin Glass, LCSW 02/04/2023

## 2023-02-04 NOTE — Psych (Signed)
Virtual Visit via Video Note  I connected with Katherine Steele on 01/31/23 at  9:00 AM EST by a video enabled telemedicine application and verified that I am speaking with the correct person using two identifiers.  Location: Patient: patient home Provider: clinical home office   I discussed the limitations of evaluation and management by telemedicine and the availability of in person appointments. The patient expressed understanding and agreed to proceed.  I discussed the assessment and treatment plan with the patient. The patient was provided an opportunity to ask questions and all were answered. The patient agreed with the plan and demonstrated an understanding of the instructions.   The patient was advised to call back or seek an in-person evaluation if the symptoms worsen or if the condition fails to improve as anticipated.  Pt was provided 240 minutes of non-face-to-face time during this encounter.   Katherine Glass, LCSW   [facility] BH PHP THERAPIST PROGRESS NOTE  Katherine Steele [id number]  Session Time: 9:00- 10:00  Participation Level: Active  Behavioral Response: CasualAlertDepressed  Type of Therapy: Group Therapy  Treatment Goals addressed: Coping  Progress Towards Goals: Initial  Interventions: CBT, DBT, Supportive, and Reframing  Summary: Katherine Steele is a 52 y.o. female who presents with depression and anxiety symptoms.  Clinician led check-in regarding current stressors and situation, and review of patient completed daily inventory. Clinician utilized active listening and empathetic response and validated patient emotions. Clinician facilitated processing group on pertinent issues.?    Therapist Response: Patient arrived within time allowed. Patient rates her mood at a 5 on a scale of 1-10 with 10 being best. Pt states she feels "okay." Pt states she slept 6 hours and ate 2x. Pt reports she is "not as good a the past few days" and states she is worried/upset about  her son leaving a job. Pt shares struggles with "caring too much." Patient able to process. Patient engaged in discussion.             Session Time: 10:00 am - 11:00 am   Participation Level: Active   Behavioral Response: CasualAlertDepressed   Type of Therapy: Group Therapy   Treatment Goals addressed: Coping   Progress Towards Goals: Progressing   Interventions: CBT, DBT, Solution Focused, Strength-based, Supportive, and Reframing   Therapist Response: Cln led discussion on knowing our limits, especially when they need to be adjusted due to fluctuation in our energy, mood, and functioning. Cln discussed limits may be taking a break from a conversation, scheduling less activities during the day, and/or being willing to stop and rest when needed. Group members shared times they have been exhausted or overwhelmed recently, and cln helped work back to determine where setting a limit may have prevented them from getting to that point. Group discussed barriers to setting limits for themselves and brainstormed ways to address those barriers.    Therapist Response: Pt engaged in discussion and is able to identify where they could set limits to help their recovery.           Session Time: 11:00 -12:00   Participation Level: Active   Behavioral Response: CasualAlertDepressed   Type of Therapy: Group Therapy   Treatment Goals addressed: Coping   Progress Towards Goals: Progressing   Interventions: CBT, DBT, Solution Focused, Strength-based, Supportive, and Reframing   Summary: Cln continued topic of CBT cognitive distortions. Cln utilized Web designer questions" as a way to introduce challenges and reframe distorted thinking. Group members worked through pt examples  to practice challenging distorted thinking.   Therapist Response: Pt engaged in discussion and demonstrates understanding of challenging distorted thoughts through practice.            Session Time: 12:00  -1:00   Participation Level: Active   Behavioral Response: CasualAlertDepressed   Type of Therapy: Group therapy, Occupational Therapy   Treatment Goals addressed: Coping   Progress Towards Goals: Progressing   Interventions: Supportive; Psychoeducation   Summary: 12:00 - 12:50: Occupational Therapy group led by cln E. Hollan. 12:50 - 1:00 Clinician assessed for immediate needs, medication compliance and efficacy, and safety concerns.   Therapist Response: 12:00 - 12:50: See OT note 12:50 - 1:00 pm: At check-out, patient reports no immediate concerns. Patient demonstrates progress as evidenced by continued engagement in group and responsiveness to treatment. Patient denies SI/HI/self-harm thoughts at the end of group.    Suicidal/Homicidal: Nowithout intent/plan  Plan: Pt will continue in PHP while working to decrease depression and anxiety symptoms, decrease negative response to stressors, and increase ability to manage symptoms in a healthy manner.   Collaboration of Care: Medication Management AEB A Pashayan  Patient/Guardian was advised Release of Information must be obtained prior to any record release in order to collaborate their care with an outside provider. Patient/Guardian was advised if they have not already done so to contact the registration department to sign all necessary forms in order for [name] to release information regarding their care.   Consent: Patient/Guardian gives verbal consent for treatment and assignment of benefits for services provided during this visit. Patient/Guardian expressed understanding and agreed to proceed.   Diagnosis: Severe episode of recurrent major depressive disorder, without psychotic features ([place]) [F33.2]    1. Severe episode of recurrent major depressive disorder, without psychotic features ([place])   2. GAD (generalized anxiety disorder)       Katherine Glass, LCSW 02/04/2023

## 2023-02-04 NOTE — Progress Notes (Signed)
BH MD/PA/NP PHP Progress Note  Virtual Visit via Video Note  I connected with Katherine Steele on 02/04/23 at  9:00 AM EST by a video enabled telemedicine application and verified that I am speaking with the correct person using two identifiers.  Location: Patient: Home Provider: [facility]   I discussed the limitations of evaluation and management by telemedicine and the availability of in person appointments. The patient expressed understanding and agreed to proceed.    Briant Cedar, MD  02/04/2023 2:47 PM Katherine Steele  MRN:  [id number]  Chief Complaint:  Chief Complaint  Patient presents with   Follow-up   Depression   Anxiety   HPI:  Katherine Steele is a 52 yr old female who presents via Virtual Video Visit to [place] and for Medication Management.  PPHx is significant for Depression, GAD, and Gender Dysphoria, and 2 Suicide Attempts Via OD (last 01/2023) and 2 Psychiatric Hospitalizations (last- [facility] 01/2023), and no history of Self Injurious Behavior.   She reports that she is doing better since stopping her Prozac.  She reports that her anxiety is much better.  She reports her sleep is improving.  She does report continued issues with depression.  Discussed starting Abilify with her to augment her Effexor.  Discussed potential risks and side effects and she was agreeable to a trial.  She reports no SI, HI, or AVH.  She reports her sleep is fair and improving.  She reports her appetite is good.  She reports no other concerns at present.   Visit Diagnosis:    ICD-10-CM   1. Severe episode of recurrent major depressive disorder, without psychotic features (HCC)  F33.2 ARIPiprazole (ABILIFY) 2 MG tablet    2. GAD (generalized anxiety disorder)  F41.1 ARIPiprazole (ABILIFY) 2 MG tablet    3. Gender dysphoria in adult  F64.0       Past Psychiatric History: Depression, GAD, and Gender Dysphoria, and 2 Suicide Attempts Via OD (last 01/2023) and 2 Psychiatric  Hospitalizations (last- [facility] 01/2023), and no history of Self Injurious Behavior.   Past Medical History:  Past Medical History:  Diagnosis Date   BMI 33.0-33.9,adult    Endometriosis    s/p hysterectomy   GAD (generalized anxiety disorder)    Dr. Annitta [place] psychiatrist   Gender dysphoria 2014   did undergo 10 mo testosterone treatment   GERD (gastroesophageal reflux disease)    HLD (hyperlipidemia) 08/12/2014   Mild off meds    MDD (major depressive disorder)    Dr. Annitta Jersey psychiatrist   PONV (postoperative nausea and vomiting)    after finger surgery    Past Surgical History:  Procedure Laterality Date   CESAREAN SECTION     COLONOSCOPY  2007   WNL [facility])   COLONOSCOPY WITH PROPOFOL N/A 03/31/2021   WNL, rpt 10 yrs Bonna Gains, [place] B, MD)   ESOPHAGOGASTRODUODENOSCOPY (EGD) WITH PROPOFOL N/A 03/21/2018   barrett's without dysplasia, mild chronic gastritis, reflux gastroesophagitis Bonna Gains, [name], MD)   ESOPHAGOGASTRODUODENOSCOPY (EGD) WITH PROPOFOL N/A 03/31/2021   small HH, shatski ring, ?barretts - biopsies WNL Bonna Gains, Margretta Sidle B, MD)   FINGER SURGERY Right 2007   little finger   OOPHORECTOMY Left 2006   TONSILLECTOMY  1975   TOTAL ABDOMINAL HYSTERECTOMY  12/2012   endometriosis and ovarian cysts s/p ovaries removed   TYMPANOSTOMY TUBE PLACEMENT Bilateral 1976    Family Psychiatric History: Father- EtOH Abuse Son- High Functioning Autism Cousin- Low Functioning Autism Paternal Cousins x2 and Uncle-  Completed Suicide Multiple Paternal Aunts and Uncles- EtOH Abuse  Family History:  Family History  Problem Relation Age of Onset   Diabetes Mother    Cancer Mother 31       ovarian with mets   Rheum arthritis Mother    Hypertension Father    Alcohol abuse Father        history   Rheum arthritis Maternal Aunt    CAD Maternal Uncle 34       MI   Depression Paternal Uncle    Alcohol abuse Paternal Uncle    Suicidality Paternal Uncle    CAD Maternal  Grandfather 75       MI   Depression Cousin    Alcohol abuse Cousin    Suicidality Cousin    Drug abuse Cousin    Suicidality Cousin    Anxiety disorder Son    Autism spectrum disorder Son    Stroke Neg Hx     Social History:  Social History   Socioeconomic History   Marital status: Married    Spouse name: michael   Number of children: 1   Years of education: Not on file   Highest education level: Associate degree: academic program  Occupational History   Not on file  Tobacco Use   Smoking status: Former    Types: Cigarettes    Quit date: 02/06/1993    Years since quitting: 30.0   Smokeless tobacco: Never  Vaping Use   Vaping Use: Never used  Substance and Sexual Activity   Alcohol use: Not Currently    Comment: social/weekends   Drug use: Not Currently    Types: Marijuana    Comment: stopped 2 weeks ago smoking marijuana   Sexual activity: Yes  Other Topics Concern   Not on file  Social History Narrative   Lives with husband Katherine Steele) and son Katherine Steele)   Occupation: Customer service manager   Edu: Associate's degree   Activity: exercise 3x/wk   Diet: good water, fruits/vegetables some   Social Determinants of Health   Financial Resource Strain: Not on file  Food Insecurity: No Food Insecurity (01/18/2023)   Hunger Vital Sign    Worried About Running Out of Food in the Last Year: Never true    [place] in the Last Year: Never true  Transportation Needs: No Transportation Needs (01/18/2023)   PRAPARE - Hydrologist (Medical): No    Lack of Transportation (Non-Medical): No  Physical Activity: Not on file  Stress: Not on file  Social Connections: Not on file    Allergies: No Known Allergies  Metabolic Disorder Labs: No results found for: "HGBA1C", "MPG" No results found for: "PROLACTIN" Lab Results  Component Value Date   CHOL 236 (H) 01/16/2023   TRIG 142.0 01/16/2023   HDL 58.70 01/16/2023   CHOLHDL 4 01/16/2023   VLDL  28.4 01/16/2023   LDLCALC 149 (H) 01/16/2023   LDLCALC 125 (H) 12/29/2021   Lab Results  Component Value Date   TSH 1.793 12/20/2022   TSH 2.88 06/26/2021    Therapeutic Level Labs: No results found for: "LITHIUM" No results found for: "VALPROATE" No results found for: "CBMZ"  Current Medications: Current Outpatient Medications  Medication Sig Dispense Refill   ARIPiprazole (ABILIFY) 2 MG tablet Take 1 tablet (2 mg total) by mouth daily. 30 tablet 1   omeprazole (PRILOSEC) 40 MG capsule Take 1 capsule (40 mg total) by mouth daily. 30 capsule 0   rosuvastatin (  CRESTOR) 10 MG tablet Take 1 tablet (10 mg total) by mouth daily after supper. 90 tablet 3   venlafaxine XR (EFFEXOR XR) 37.5 MG 24 hr capsule Take 1 capsule (37.5 mg total) by mouth daily with breakfast. Take along with 75 mg daily 30 capsule 0   venlafaxine XR (EFFEXOR XR) 75 MG 24 hr capsule Take 1 capsule (75 mg total) by mouth daily with breakfast. 30 capsule 0   No current facility-administered medications for this visit.     Musculoskeletal: Strength & Muscle Tone: within normal limits Gait & Station:  Sitting During Interview Patient leans: N/A  Psychiatric Specialty Exam: Review of Systems  Respiratory:  Negative for shortness of breath.   Cardiovascular:  Negative for chest pain.  Gastrointestinal:  Negative for abdominal pain, constipation, diarrhea, nausea and vomiting.  Neurological:  Negative for dizziness, weakness and headaches.  Psychiatric/Behavioral:  Positive for dysphoric mood. Negative for hallucinations, sleep disturbance and suicidal ideas. The patient is not nervous/anxious.     Last menstrual period 07/17/2012.There is no height or weight on file to calculate BMI.  General Appearance: Casual and Fairly Groomed  Eye Contact:  Good  Speech:  Clear and Coherent and Normal Rate  Volume:  Normal  Mood:  Dysphoric  Affect:  Congruent  Thought Process:  Coherent and Goal Directed  Orientation:   Full (Time, Place, and Person)  Thought Content: WDL and Logical   Suicidal Thoughts:  No  Homicidal Thoughts:  No  Memory:  Immediate;   Good Recent;   Good  Judgement:  Good  Insight:  Good  Psychomotor Activity:  Normal  Concentration:  Concentration: Good and Attention Span: Good  Recall:  Good  Fund of Knowledge: Good  Language: Good  Akathisia:  Negative  Handed:  Right  AIMS (if indicated): not done  Assets:  Communication Skills Desire for Improvement Housing Resilience Social Support  ADL's:  Intact  Cognition: WNL  Sleep:  Fair improving   Screenings: AIMS    Flowsheet Row Admission (Discharged) from 01/18/2023 in [place] Total Score 0      AUDIT    [place] Admission (Discharged) from 01/18/2023 in [place]  Alcohol Use Disorder Identification Test Final Score (AUDIT) 0      GAD-7    Flowsheet Row Counselor from 01/24/2023 in [place] Office Visit from 01/23/2023 in [place] Office Visit from 01/17/2023 in [place] Office Visit from 01/16/2023 in [place] at [place] Office Visit from 11/30/2022 in [place]  Total GAD-7 Score '13 9 13 14 14      '$ PHQ2-9    Flowsheet Row Counselor from 02/01/2023 in [place] Counselor from 01/24/2023 in [place] Office Visit from 01/23/2023 in [place] Office Visit from 01/17/2023 in [place] Office Visit from 01/16/2023 in [place] at [place]  PHQ-2 Total Score '2 4 5 4 4  '$ PHQ-9 Total Score '8 15 16 17 18      '$ Flowsheet Row Counselor from 02/01/2023 in [place] Counselor from 01/24/2023 in [place] Office Visit from 01/23/2023 in [place] Error: Q3, 4, or 5 should not be populated when Q2 is No Error:  Q3, 4, or 5 should not be populated when Q2 is No High Risk        Assessment and Plan:  Tysheria Vogt is a 52 yr old female who presents via Virtual Video Visit to [place] and for Medication Management.  PPHx is significant for Depression, GAD, and Gender Dysphoria, and 2 Suicide Attempts Via OD (last 01/2023) and 2 Psychiatric Hospitalizations (last- [facility] 01/2023), and no history of Self Injurious Behavior.    Sashay does report improvement in her anxiety since stopping her Prozac.  She reports continuing to have issues with her depression.  Given her impulsivity plus the potential for worsening her anxiety with further increases in the Effexor we will start Abilify at this time for augmentation.  Prior EKG obtained on 01/18/2023-sinus bradycardia with QTc 440.   MDD, Recurrent, Severe, w/out Psychosis  GAD: -Continue Effexor XR 117.5 mg daily.  No refills sent at this time. -Start Abilify 2 mg daily for augmentation.  30 tablets with 0 refills.   Collaboration of Care: Collaboration of Care: Other PHP Prrogram  Patient/Guardian was advised Release of Information must be obtained prior to any record release in order to collaborate their care with an outside provider. Patient/Guardian was advised if they have not already done so to contact the registration department to sign all necessary forms in order for [name] to release information regarding their care.   Consent: Patient/Guardian gives verbal consent for treatment and assignment of benefits for services provided during this visit. Patient/Guardian expressed understanding and agreed to proceed.    Briant Cedar, MD 02/04/2023, 2:47 PM  Follow Up  Instructions:    I discussed the assessment and treatment plan with the patient. The patient was provided an opportunity to ask questions and all were answered. The patient agreed with the plan and demonstrated an understanding of the instructions.   The patient was advised to call back or seek an in-person evaluation if the symptoms worsen or if the condition fails to improve as anticipated.  I provided 15 minutes of non-face-to-face time during this encounter.

## 2023-02-05 ENCOUNTER — Other Ambulatory Visit (HOSPITAL_COMMUNITY): Payer: BC Managed Care – PPO

## 2023-02-05 ENCOUNTER — Other Ambulatory Visit (HOSPITAL_COMMUNITY): Payer: BC Managed Care – PPO | Admitting: Licensed Clinical Social Worker

## 2023-02-05 DIAGNOSIS — Z79899 Other long term (current) drug therapy: Secondary | ICD-10-CM | POA: Diagnosis not present

## 2023-02-05 DIAGNOSIS — F411 Generalized anxiety disorder: Secondary | ICD-10-CM

## 2023-02-05 DIAGNOSIS — F640 Transsexualism: Secondary | ICD-10-CM | POA: Diagnosis not present

## 2023-02-05 DIAGNOSIS — F332 Major depressive disorder, recurrent severe without psychotic features: Secondary | ICD-10-CM

## 2023-02-06 ENCOUNTER — Other Ambulatory Visit (HOSPITAL_COMMUNITY): Payer: BC Managed Care – PPO

## 2023-02-06 ENCOUNTER — Other Ambulatory Visit (HOSPITAL_COMMUNITY): Payer: BC Managed Care – PPO | Admitting: Licensed Clinical Social Worker

## 2023-02-06 DIAGNOSIS — F411 Generalized anxiety disorder: Secondary | ICD-10-CM

## 2023-02-06 DIAGNOSIS — F332 Major depressive disorder, recurrent severe without psychotic features: Secondary | ICD-10-CM | POA: Diagnosis not present

## 2023-02-06 DIAGNOSIS — Z79899 Other long term (current) drug therapy: Secondary | ICD-10-CM | POA: Diagnosis not present

## 2023-02-06 DIAGNOSIS — F640 Transsexualism: Secondary | ICD-10-CM | POA: Diagnosis not present

## 2023-02-07 ENCOUNTER — Other Ambulatory Visit (HOSPITAL_COMMUNITY): Payer: BC Managed Care – PPO | Admitting: Licensed Clinical Social Worker

## 2023-02-07 ENCOUNTER — Encounter (HOSPITAL_COMMUNITY): Payer: Self-pay

## 2023-02-07 ENCOUNTER — Other Ambulatory Visit (HOSPITAL_COMMUNITY): Payer: BC Managed Care – PPO

## 2023-02-07 DIAGNOSIS — F640 Transsexualism: Secondary | ICD-10-CM | POA: Diagnosis not present

## 2023-02-07 DIAGNOSIS — F332 Major depressive disorder, recurrent severe without psychotic features: Secondary | ICD-10-CM

## 2023-02-07 DIAGNOSIS — F411 Generalized anxiety disorder: Secondary | ICD-10-CM

## 2023-02-07 DIAGNOSIS — Z79899 Other long term (current) drug therapy: Secondary | ICD-10-CM | POA: Diagnosis not present

## 2023-02-07 DIAGNOSIS — F649 Gender identity disorder, unspecified: Secondary | ICD-10-CM | POA: Diagnosis not present

## 2023-02-07 DIAGNOSIS — F419 Anxiety disorder, unspecified: Secondary | ICD-10-CM | POA: Diagnosis not present

## 2023-02-07 DIAGNOSIS — R4589 Other symptoms and signs involving emotional state: Secondary | ICD-10-CM

## 2023-02-07 NOTE — Progress Notes (Signed)
BH MD/PA/NP PHP Progress Note  Virtual Visit via Video Note  I connected with Katherine Steele on 02/08/23 at  9:00 AM EST by a video enabled telemedicine application and verified that I am speaking with the correct person using two identifiers.  Location: Patient: Home Provider: [facility]   I discussed the limitations of evaluation and management by telemedicine and the availability of in person appointments. The patient expressed understanding and agreed to proceed.  02/08/2023 4:26 AM Katherine Steele  MRN:  [id number]  Chief Complaint:  Chief Complaint  Patient presents with   Follow-up   Depression   Anxiety   HPI:  Katherine Steele is a 52 yr old female who presents via Virtual Video Visit for Follow Up and Medication Management, she enrolled in the [facility] Program on 01/29/2023.  PPHx is significant for Depression, GAD, and Gender Dysphoria, and 2 Suicide Attempts Via OD (last 01/2023) and 2 Psychiatric Hospitalizations (last- [facility] 01/2023), and no history of Self Injurious Behavior.   She reports that she has noticed a significant improvement since starting the Abilify.  She reports that her racing thoughts that used to get "stuck" have disappeared.  She reports that once again she has goals and energy to do them.  She reports she has not had that for a long while.  When asked about side effects she reports that when she took the first dose Monday it was late evening and her sleep was not that good so she did not take it on Tuesday but has now taken it the last 2 days.  She reports that last night her sleep hours wise was good but that it was not as restful as it has been but states that it is better than Monday nights and thinks that is resolving.  When asked if she was having any restlessness or urge to move she reports she is not having any of that.  Discussed with her that we would continue with the Abilify for now as it does seem that this issue is resolving on its own but that if it does not we  will discuss medication changes and she was agreeable with this.  She reports no SI, HI, or AVH.  She reports her sleep is fair.  She reports her appetite is good.  She reports no other concerns at present.   Visit Diagnosis:    ICD-10-CM   1. Severe episode of recurrent major depressive disorder, without psychotic features ([place])  F33.2     2. GAD (generalized anxiety disorder)  F41.1     3. Gender dysphoria in adult  F64.0       Past Psychiatric History: Depression, GAD, and Gender Dysphoria, and 2 Suicide Attempts Via OD (last 01/2023) and 2 Psychiatric Hospitalizations (last- [facility] 01/2023), and no history of Self Injurious Behavior.  Past Medical History:  Past Medical History:  Diagnosis Date   BMI 33.0-33.9,adult    Endometriosis    s/p hysterectomy   GAD (generalized anxiety disorder)    Dr. Annitta Jersey psychiatrist   Gender dysphoria 2014   did undergo 10 mo testosterone treatment   GERD (gastroesophageal reflux disease)    HLD (hyperlipidemia) 08/12/2014   Mild off meds    MDD (major depressive disorder)    Dr. Annitta Jersey psychiatrist   PONV (postoperative nausea and vomiting)    after finger surgery    Past Surgical History:  Procedure Laterality Date   CESAREAN SECTION     COLONOSCOPY  2007  WNL ([place])   COLONOSCOPY WITH PROPOFOL N/A 03/31/2021   WNL, rpt 10 yrs Bonna Gains, [place] B, MD)   ESOPHAGOGASTRODUODENOSCOPY (EGD) WITH PROPOFOL N/A 03/21/2018   barrett's without dysplasia, mild chronic gastritis, reflux gastroesophagitis Bonna Gains, [name], MD)   ESOPHAGOGASTRODUODENOSCOPY (EGD) WITH PROPOFOL N/A 03/31/2021   small HH, shatski ring, ?barretts - biopsies WNL Bonna Gains, [name], MD)   FINGER SURGERY Right 2007   little finger   OOPHORECTOMY Left 2006   TONSILLECTOMY  1975   TOTAL ABDOMINAL HYSTERECTOMY  12/2012   endometriosis and ovarian cysts s/p ovaries removed   TYMPANOSTOMY TUBE PLACEMENT Bilateral 1976    Family Psychiatric History: Father-  EtOH Abuse Son- High Functioning Autism Cousin- Low Functioning Autism Paternal Cousins x2 and Interior and spatial designer- Completed Suicide Multiple Paternal Aunts and Uncles- EtOH Abuse  Family History:  Family History  Problem Relation Age of Onset   Diabetes Mother    Cancer Mother 68       ovarian with mets   Rheum arthritis Mother    Hypertension Father    Alcohol abuse Father        history   Rheum arthritis Maternal Aunt    CAD Maternal Uncle 44       MI   Depression Paternal Uncle    Alcohol abuse Paternal Uncle    Suicidality Paternal Uncle    CAD Maternal Grandfather 21       MI   Depression Cousin    Alcohol abuse Cousin    Suicidality Cousin    Drug abuse Cousin    Suicidality Cousin    Anxiety disorder Son    Autism spectrum disorder Son    Stroke Neg Hx     Social History:  Social History   Socioeconomic History   Marital status: Married    Spouse name: michael   Number of children: 1   Years of education: Not on file   Highest education level: Associate degree: academic program  Occupational History   Not on file  Tobacco Use   Smoking status: Former    Types: Cigarettes    Quit date: 02/06/1993    Years since quitting: 30.0   Smokeless tobacco: Never  Vaping Use   Vaping Use: Never used  Substance and Sexual Activity   Alcohol use: Not Currently    Comment: social/weekends   Drug use: Not Currently    Types: Marijuana    Comment: stopped 2 weeks ago smoking marijuana   Sexual activity: Yes  Other Topics Concern   Not on file  Social History Narrative   Lives with husband Ronalee Belts) and son Remo Lipps)   Occupation: Customer service manager   Edu: Associate's degree   Activity: exercise 3x/wk   Diet: good water, fruits/vegetables some   Social Determinants of Health   Financial Resource Strain: Not on file  Food Insecurity: No Food Insecurity (01/18/2023)   Hunger Vital Sign    Worried About Running Out of Food in the Last Year: Never true    Ran Out of Food  in the Last Year: Never true  Transportation Needs: No Transportation Needs (01/18/2023)   PRAPARE - Hydrologist (Medical): No    Lack of Transportation (Non-Medical): No  Physical Activity: Not on file  Stress: Not on file  Social Connections: Not on file    Allergies: No Known Allergies  Metabolic Disorder Labs: No results found for: "HGBA1C", "MPG" No results found for: "PROLACTIN" Lab Results  Component Value Date  CHOL 236 (H) 01/16/2023   TRIG 142.0 01/16/2023   HDL 58.70 01/16/2023   CHOLHDL 4 01/16/2023   VLDL 28.4 01/16/2023   LDLCALC 149 (H) 01/16/2023   LDLCALC 125 (H) 12/29/2021   Lab Results  Component Value Date   TSH 1.793 12/20/2022   TSH 2.88 06/26/2021    Therapeutic Level Labs: No results found for: "LITHIUM" No results found for: "VALPROATE" No results found for: "CBMZ"  Current Medications: Current Outpatient Medications  Medication Sig Dispense Refill   ARIPiprazole (ABILIFY) 2 MG tablet Take 1 tablet (2 mg total) by mouth daily. 30 tablet 1   omeprazole (PRILOSEC) 40 MG capsule Take 1 capsule (40 mg total) by mouth daily. 30 capsule 0   rosuvastatin (CRESTOR) 10 MG tablet Take 1 tablet (10 mg total) by mouth daily after supper. 90 tablet 3   venlafaxine XR (EFFEXOR XR) 37.5 MG 24 hr capsule Take 1 capsule (37.5 mg total) by mouth daily with breakfast. Take along with 75 mg daily 30 capsule 0   venlafaxine XR (EFFEXOR XR) 75 MG 24 hr capsule Take 1 capsule (75 mg total) by mouth daily with breakfast. 30 capsule 0   No current facility-administered medications for this visit.     Musculoskeletal: Strength & Muscle Tone: within normal limits Gait & Station:  Sitting During Interview Patient leans: N/A  Psychiatric Specialty Exam: Review of Systems  Respiratory:  Negative for shortness of breath.   Cardiovascular:  Negative for chest pain.  Gastrointestinal:  Negative for abdominal pain, constipation, diarrhea,  nausea and vomiting.  Neurological:  Negative for dizziness, weakness and headaches.  Psychiatric/Behavioral:  Positive for sleep disturbance (mild). Negative for dysphoric mood, hallucinations and suicidal ideas. The patient is not nervous/anxious.     Last menstrual period 07/17/2012.There is no height or weight on file to calculate BMI.  General Appearance: Casual and Fairly Groomed  Eye Contact:  Good  Speech:  Clear and Coherent and Normal Rate  Volume:  Normal  Mood:   "ok"  Affect:  Appropriate and Congruent  Thought Process:  Coherent and Goal Directed  Orientation:  Full (Time, Place, and Person)  Thought Content: WDL and Logical   Suicidal Thoughts:  No  Homicidal Thoughts:  No  Memory:  Immediate;   Good Recent;   Good  Judgement:  Good  Insight:  Good  Psychomotor Activity:  Normal  Concentration:  Concentration: Good and Attention Span: Good  Recall:  Good  Fund of Knowledge: Good  Language: Good  Akathisia:  Negative  Handed:  Right  AIMS (if indicated): not done  Assets:  Communication Skills Desire for Improvement Housing Resilience Social Support  ADL's:  Intact  Cognition: WNL  Sleep:  Fair   Screenings: AIMS    [place] Admission (Discharged) from 01/18/2023 in [place] Total Score 0      AUDIT    [place] Admission (Discharged) from 01/18/2023 in [place]  Alcohol Use Disorder Identification Test Final Score (AUDIT) 0      GAD-7    Flowsheet Row Counselor from 01/24/2023 in [place] Office Visit from 01/23/2023 in [place] Office Visit from 01/17/2023 in [place] Office Visit from 01/16/2023 in [place] [facility] [place] Visit from 11/30/2022 in [place]  Total GAD-7 Score '13 9 13 14 '$ 14  PHQ2-9    Flowsheet Row Counselor from 02/01/2023 in [place] Counselor from 01/24/2023 in [place] Office Visit from 01/23/2023 in [place] Office Visit from 01/17/2023 in [place] Office Visit from 01/16/2023 in [place] [facility] [place]  PHQ-2 Total Score '2 4 5 4 4  '$ PHQ-9 Total Score '8 15 16 17 18      '$ Flowsheet Row Counselor from 02/01/2023 in [place] Counselor from 01/24/2023 in [place] Office Visit from 01/23/2023 in [place] Error: Q3, 4, or 5 should not be populated when Q2 is No Error: Q3, 4, or 5 should not be populated when Q2 is No High Risk        Assessment and Plan:  Alhia Kovanda is a 52 yr old female who presents via Virtual Video Visit for Follow Up and Medication Management, she enrolled in the [facility] Program on 01/29/2023.  PPHx is significant for Depression, GAD, and Gender Dysphoria, and 2 Suicide Attempts Via OD (last 01/2023) and 2 Psychiatric Hospitalizations (last- [facility] 01/2023), and no history of Self Injurious Behavior.     Majorie had some slight worsening of her sleep but significant improvement in her depression/anxiety with the start of Abilify.  Since there is already some improving in her sleep we will not make any changes to her medications at this time.  May consider decreasing her Abilify if it continues to be an issue.  We will continue to monitor.   MDD, Recurrent, Severe, w/out Psychosis  GAD: -Continue Effexor XR 117.5 mg daily.  No refills sent at this time. -Continue Abilify 2 mg daily for augmentation.  No refills sent at this time.    Collaboration of Care: Collaboration of Care: Other PHP Program  Patient/Guardian  was advised Release of Information must be obtained prior to any record release in order to collaborate their care with an outside provider. Patient/Guardian was advised if they have not already done so to contact the registration department to sign all necessary forms in order for [name] to release information regarding their care.   Consent: Patient/Guardian gives verbal consent for treatment and assignment of benefits for services provided during this visit. Patient/Guardian expressed understanding and agreed to proceed.    Briant Cedar, MD 02/08/2023, 4:26 AM   Follow Up Instructions:    I discussed the assessment and treatment plan with the patient. The patient was provided an opportunity to ask questions and all were answered. The patient agreed with the plan and demonstrated an understanding of the instructions.   The patient was advised to call back or seek an in-person evaluation if the symptoms worsen or if the condition fails to improve as anticipated.  I provided 15 minutes of non-face-to-face time during this encounter.   Briant Cedar, MD

## 2023-02-08 ENCOUNTER — Other Ambulatory Visit (HOSPITAL_COMMUNITY): Payer: BC Managed Care – PPO | Admitting: Licensed Clinical Social Worker

## 2023-02-08 ENCOUNTER — Encounter (HOSPITAL_COMMUNITY): Payer: Self-pay

## 2023-02-08 ENCOUNTER — Other Ambulatory Visit (HOSPITAL_COMMUNITY): Payer: BC Managed Care – PPO

## 2023-02-08 DIAGNOSIS — F419 Anxiety disorder, unspecified: Secondary | ICD-10-CM | POA: Diagnosis not present

## 2023-02-08 DIAGNOSIS — R4589 Other symptoms and signs involving emotional state: Secondary | ICD-10-CM

## 2023-02-08 DIAGNOSIS — F411 Generalized anxiety disorder: Secondary | ICD-10-CM

## 2023-02-08 DIAGNOSIS — F332 Major depressive disorder, recurrent severe without psychotic features: Secondary | ICD-10-CM | POA: Diagnosis not present

## 2023-02-08 DIAGNOSIS — F649 Gender identity disorder, unspecified: Secondary | ICD-10-CM | POA: Diagnosis not present

## 2023-02-08 DIAGNOSIS — Z79899 Other long term (current) drug therapy: Secondary | ICD-10-CM | POA: Diagnosis not present

## 2023-02-08 DIAGNOSIS — F640 Transsexualism: Secondary | ICD-10-CM | POA: Diagnosis not present

## 2023-02-08 NOTE — Therapy (Signed)
[place], [place], [address] Phone: [phone]   Fax:  [phone]  Occupational Therapy Treatment Virtual Visit via Video Note  I connected with [name] on 02/08/23 at  8:00 AM EST by a video enabled telemedicine application and verified that I am speaking with the correct person using two identifiers.  Location: Patient: home Provider: office   I discussed the limitations of evaluation and management by telemedicine and the availability of in person appointments. The patient expressed understanding and agreed to proceed.    The patient was advised to call back or seek an in-person evaluation if the symptoms worsen or if the condition fails to improve as anticipated.  I provided 55 minutes of non-face-to-face time during this encounter.   Patient Details  Name: Katherine Steele MRN: [id number] Date of Birth: 04-22-1971 No data recorded  Encounter Date: 02/07/2023   OT End of Session - 02/08/23 0905     Visit Number 6    Number of Visits 20    Date for OT Re-Evaluation 03/02/23    OT Start Time 1200    OT Stop Time [id number]    OT Time Calculation (min) 55 min             Past Medical History:  Diagnosis Date   BMI 33.0-33.9,adult    Endometriosis    s/p hysterectomy   GAD (generalized anxiety disorder)    Dr. Annitta [place] psychiatrist   Gender dysphoria 2014   did undergo 10 mo testosterone treatment   GERD (gastroesophageal reflux disease)    HLD (hyperlipidemia) 08/12/2014   Mild off meds    MDD (major depressive disorder)    Dr. Annitta Jersey psychiatrist   PONV (postoperative nausea and vomiting)    after finger surgery    Past Surgical History:  Procedure Laterality Date   CESAREAN SECTION     COLONOSCOPY  2007   WNL [facility])   COLONOSCOPY WITH PROPOFOL N/A 03/31/2021   WNL, rpt 10 yrs Bonna Gains, Allen B, MD)   ESOPHAGOGASTRODUODENOSCOPY (EGD) WITH PROPOFOL N/A 03/21/2018    barrett's without dysplasia, mild chronic gastritis, reflux gastroesophagitis Bonna Gains, [name], MD)   ESOPHAGOGASTRODUODENOSCOPY (EGD) WITH PROPOFOL N/A 03/31/2021   small HH, shatski ring, ?barretts - biopsies WNL Bonna Gains, Lennette Bihari, MD)   FINGER SURGERY Right 2007   little finger   OOPHORECTOMY Left 2006   TONSILLECTOMY  1975   TOTAL ABDOMINAL HYSTERECTOMY  12/2012   endometriosis and ovarian cysts s/p ovaries removed   TYMPANOSTOMY TUBE PLACEMENT Bilateral 1976    There were no vitals filed for this visit.   Subjective Assessment - 02/08/23 0905     Currently in Pain? No/denies    Pain Score 0-No pain               Group Session:  S: Doing better today.   O: The primary objective of integrating mindfulness and meditation into the therapeutic process for this group of adults is to enhance their daily functioning and overall well-being, despite the challenges posed by depression, anxiety, and the distractions of modern life, including technology. This objective will be pursued through the following measurable and observable components: Mindfulness Practice: Track frequency, duration, and types of mindfulness activities. Concentration and Attention: Assess improvements in focus and present-moment engagement. Depression and Anxiety Symptoms: Measure changes using standardized scales and self-reports. Emotional Regulation: Observe advancements in managing emotional responses and stress. Physical Health and Sleep: Monitor changes in  health indicators and sleep quality related to mindfulness practice. Social Functioning: Document shifts in interpersonal relationships, communication, and empathy. These concise objectives aim to quantify the impact of mindfulness and meditation on enhancing life quality for participants, allowing for targeted adjustments to maximize therapeutic outcomes.   A: The patient has shown significant engagement in the group sessions, actively  participating in discussions, sharing personal experiences, and consistently practicing mindfulness and meditation exercises both within and outside of the therapy setting. This active involvement has correlated with noticeable improvements in their ability to manage symptoms of depression and anxiety, as evidenced by self-reports and observable changes in behavior. The patient demonstrates enhanced concentration and emotional regulation, actively applies coping strategies learned in sessions, and reports increased satisfaction with interpersonal relationships and daily functioning. The proactive approach to integrating mindfulness practices into their life suggests a positive trajectory in their mental health journey, with mindfulness serving as a beneficial tool for managing stress and emotional challenges.    P: Continue to attend PHP OT group sessions 5x week for 4 weeks to promote daily structure, social engagement, and opportunities to develop and utilize adaptive strategies to maximize functional performance in preparation for safe transition and integration back into school, work, and the community. Plan to address topic of pt 2 in next OT group session.                    OT Education - 02/08/23 0905     Education Details Mindfulness and Meditation Practices              OT Short Term Goals - 01/31/23 1907       OT SHORT TERM GOAL #1   Title Client will develop and utilize a personalized coping toolbox containing at least five coping strategies to manage challenging situations, demonstrating their use in real-life scenarios by the end of therapy.    Time 4    Period Weeks    Status On-going    Target Date 03/02/23      OT SHORT TERM GOAL #2   Title By discharge, client will demonstrate the ability to adapt and modify routines when faced with unexpected events or changes, maintaining a balanced approach to daily tasks.      OT SHORT TERM GOAL #3   Title By the  time of discharge, client will independently set, track, and make progress towards a long-term goal, demonstrating resilience in overcoming obstacles and seeking support when needed.                      Plan - 02/08/23 0905     Psychosocial Skills Interpersonal Interaction;Routines and Behaviors;Habits;Coping Strategies             Patient will benefit from skilled therapeutic intervention in order to improve the following deficits and impairments:       Psychosocial Skills: Interpersonal Interaction, Routines and Behaviors, Habits, Coping Strategies   Visit Diagnosis: Difficulty coping    Problem List Patient Active Problem List   Diagnosis Date Noted   MDD (major depressive disorder), recurrent episode, severe ([place]) 01/24/2023   Gender dysphoria 11/30/2022   Long term current use of cannabis 11/30/2022   High risk medication use 11/30/2022   Anxiety attack 09/28/2022   Adult attention deficit disorder 02/14/2021   Obesity, Class I, BMI 30-34.9    Surgical menopause, asymptomatic 12/09/2018   Schatzki's ring    Barrett's esophagus    Gastroesophageal reflux disease  Esophageal dysphagia    Hiatal hernia    Acquired trigger finger 09/19/2016   HLD (hyperlipidemia) 08/12/2014   Health maintenance examination 05/04/2014   Gender dysphoria in adult 07/17/2012   Anxiety disorder 02/07/2012   MDD (major depressive disorder), recurrent episode, moderate ([place]) 02/07/2012    Brantley Stage, OT 02/08/2023, 9:06 AM Cornell Barman, OT  [facility] PROGRAM [place], [place], [address] Phone: [phone]   Fax:  [phone]  Name: Katherine Steele MRN: [id number] Date of Birth: May 20, 1971

## 2023-02-09 NOTE — Psych (Signed)
Virtual Visit via Video Note  I connected with Katherine Steele on 02/04/23 at  9:00 AM EST by a video enabled telemedicine application and verified that I am speaking with the correct person using two identifiers.  Location: Patient: patient home Provider: clinical home office   I discussed the limitations of evaluation and management by telemedicine and the availability of in person appointments. The patient expressed understanding and agreed to proceed.  I discussed the assessment and treatment plan with the patient. The patient was provided an opportunity to ask questions and all were answered. The patient agreed with the plan and demonstrated an understanding of the instructions.   The patient was advised to call back or seek an in-person evaluation if the symptoms worsen or if the condition fails to improve as anticipated.  Pt was provided 240 minutes of non-face-to-face time during this encounter.   Lorin Glass, LCSW   [facility] BH PHP THERAPIST PROGRESS NOTE  Katherine Steele [id number]  Session Time: 9:00- 10:00  Participation Level: Active  Behavioral Response: CasualAlertDepressed  Type of Therapy: Group Therapy  Treatment Goals addressed: Coping  Progress Towards Goals: Progressing  Interventions: CBT, DBT, Supportive, and Reframing  Summary: Katherine Steele is a 52 y.o. female who presents with depression and anxiety symptoms.  Clinician led check-in regarding current stressors and situation, and review of patient completed daily inventory. Clinician utilized active listening and empathetic response and validated patient emotions. Clinician facilitated processing group on pertinent issues.?    Therapist Response: Patient arrived within time allowed. Patient rates her mood at a 4 on a scale of 1-10 with 10 being best. Pt states she feels "not as good." Pt states she slept 8 hours and ate 3x. Pt reports Saturday was a "drag" and she experienced hopelessness, rumination,  and negative self-talk/thinking. Pt reports thinking about work triggered the down slide and she was unable to move through it most of the day. Pt states cooking dinner and going to church on Sunday helped and cln discussed with pt way to intentionally seek distractions to cope.  Patient able to process. Patient engaged in discussion.             Session Time: 10:00 am - 11:00 am   Participation Level: Active   Behavioral Response: CasualAlertDepressed   Type of Therapy: Group Therapy   Treatment Goals addressed: Coping   Progress Towards Goals: Progressing   Interventions: CBT, DBT, Solution Focused, Strength-based, Supportive, and Reframing   Therapist Response: Cln led discussion on communicating our struggles with our support team. Cln introduced the concept of giving disclosures to the people close to [name] regarding the way we act in specific situations or the way we process certain stimuli. Group members discussed ways to provide this "road map to our brain" and in what situations this may be helpful to them   Therapist Response: Pt engaged in discussion and is able to apply concepts to their life.            Session Time: 11:00 -12:00   Participation Level: Active   Behavioral Response: CasualAlertDepressed   Type of Therapy: Group Therapy   Treatment Goals addressed: Coping   Progress Towards Goals: Progressing   Interventions: CBT, DBT, Solution Focused, Strength-based, Supportive, and Reframing   Summary: Cln led discussion on unrealistic expectations and the ways expectations can alter perspective. Group members discussed feelings and situations that have occurred due to expectation and how to know if they are unrealistic. Cln brought in  CBT thought challenging and reframing to aid discussion.    Therapist Response:  Pt engaged in discussion and reports gaining insight.            Session Time: 12:00 -1:00   Participation Level: Active   Behavioral  Response: CasualAlertDepressed   Type of Therapy: Group therapy, Occupational Therapy   Treatment Goals addressed: Coping   Progress Towards Goals: Progressing   Interventions: Supportive; Psychoeducation   Summary: 12:00 - 12:50: Occupational Therapy group led by cln E. Hollan. 12:50 - 1:00 Clinician assessed for immediate needs, medication compliance and efficacy, and safety concerns.   Therapist Response: 12:00 - 12:50: See OT note 12:50 - 1:00 pm: At check-out, patient reports no immediate concerns. Patient demonstrates progress as evidenced by continued engagement in group and responsiveness to treatment. Patient denies SI/HI/self-harm thoughts at the end of group.    Suicidal/Homicidal: Nowithout intent/plan  Plan: Pt will continue in PHP while working to decrease depression and anxiety symptoms, decrease negative response to stressors, and increase ability to manage symptoms in a healthy manner.   Collaboration of Care: Medication Management AEB A Pashayan  Patient/Guardian was advised Release of Information must be obtained prior to any record release in order to collaborate their care with an outside provider. Patient/Guardian was advised if they have not already done so to contact the registration department to sign all necessary forms in order for [name] to release information regarding their care.   Consent: Patient/Guardian gives verbal consent for treatment and assignment of benefits for services provided during this visit. Patient/Guardian expressed understanding and agreed to proceed.   Diagnosis: Severe episode of recurrent major depressive disorder, without psychotic features ([place]) [F33.2]    1. Severe episode of recurrent major depressive disorder, without psychotic features ([place])   2. GAD (generalized anxiety disorder)   3. Gender dysphoria in adult       [place], [place] 02/09/2023

## 2023-02-10 ENCOUNTER — Encounter (HOSPITAL_COMMUNITY): Payer: Self-pay

## 2023-02-10 NOTE — Psych (Signed)
Virtual Visit via Video Note  I connected with Katherine Steele on 02/05/23 at  9:00 AM EST by a video enabled telemedicine application and verified that I am speaking with the correct person using two identifiers.  Location: Patient: patient home Provider: clinical home office   I discussed the limitations of evaluation and management by telemedicine and the availability of in person appointments. The patient expressed understanding and agreed to proceed.  I discussed the assessment and treatment plan with the patient. The patient was provided an opportunity to ask questions and all were answered. The patient agreed with the plan and demonstrated an understanding of the instructions.   The patient was advised to call back or seek an in-person evaluation if the symptoms worsen or if the condition fails to improve as anticipated.  Pt was provided 240 minutes of non-face-to-face time during this encounter.   Lorin Glass, LCSW   [facility] BH PHP THERAPIST PROGRESS NOTE  Katherine Steele [id number]  Session Time: 9:00- 10:00  Participation Level: Active  Behavioral Response: CasualAlertDepressed  Type of Therapy: Group Therapy  Treatment Goals addressed: Coping  Progress Towards Goals: Progressing  Interventions: CBT, DBT, Supportive, and Reframing  Summary: Katherine Steele is a 52 y.o. female who presents with depression and anxiety symptoms.  Clinician led check-in regarding current stressors and situation, and review of patient completed daily inventory. Clinician utilized active listening and empathetic response and validated patient emotions. Clinician facilitated processing group on pertinent issues.?    Therapist Response: Patient arrived within time allowed. Patient rates her mood at a 5 on a scale of 1-10 with 10 being best. Pt states she feels "tired." Pt states she slept 7 hours and ate 3x. Pt reports she began a new medication last night and feels drowsy. Pt states she ran  some errands yesterday and felt good being out and about. Pt reports difficulty with ruminating and worrying. Patient able to process. Patient engaged in discussion.             Session Time: 10:00 am - 11:00 am   Participation Level: Active   Behavioral Response: CasualAlertDepressed   Type of Therapy: Group Therapy   Treatment Goals addressed: Coping   Progress Towards Goals: Progressing   Interventions: CBT, DBT, Solution Focused, Strength-based, Supportive, and Reframing   Therapist Response: Cln led discussion on Maslow's Hierarchy of Needs and how it can inform the way we approach self care and recovery.    Therapist Response: Pt engaged in discussion and identifies they are in the bottom two tiers of the pyramid.           Session Time: 11:00 -12:00   Participation Level: Active   Behavioral Response: CasualAlertDepressed   Type of Therapy: Group Therapy   Treatment Goals addressed: Coping   Progress Towards Goals: Progressing   Interventions: CBT, DBT, Solution Focused, Strength-based, Supportive, and Reframing   Summary: Cln led discussion on trust and how to establish healthy trust. Group discussed common missteps such as disclosing personal information too soon, ignoring behaviors being displayed, inaccurately defining the relationship, and not giving people credit. Cln utilized CBT thought challenging principles to encourage pt's to rely on "evidence" versus feelings. Group members shared struggles they experience with trust.    Therapist Response:  Pt engaged in discussion and is able to identify areas of improvement in their trust process.          Session Time: 12:00 -1:00   Participation Level: Active   Behavioral  Response: CasualAlertDepressed   Type of Therapy: Group therapy   Treatment Goals addressed: Coping   Progress Towards Goals: Progressing   Interventions: CBT, DBT, Solution Focused, Strength-based, Supportive, and Reframing    Summary: 12:00 - 12:50: Cln introduced topic of boundaries. Cln discussed how boundaries inform our relationships and affect self-esteem and personal agency. Group discussed the three types of boundaries: rigid, porous, and healthy and when each type is most helpful/harmful.  12:50 - 1:00 Clinician assessed for immediate needs, medication compliance and efficacy, and safety concerns.   Therapist Response: 12:00 - 12:50: Pt engaged in discussion and reports understanding.  12:50 - 1:00 pm: At check-out, patient reports no immediate concerns. Patient demonstrates progress as evidenced by continued engagement in group and responsiveness to treatment. Patient denies SI/HI/self-harm thoughts at the end of group.    Suicidal/Homicidal: Nowithout intent/plan  Plan: Pt will continue in PHP while working to decrease depression and anxiety symptoms, decrease negative response to stressors, and increase ability to manage symptoms in a healthy manner.   Collaboration of Care: Medication Management AEB A Pashayan  Patient/Guardian was advised Release of Information must be obtained prior to any record release in order to collaborate their care with an outside provider. Patient/Guardian was advised if they have not already done so to contact the registration department to sign all necessary forms in order for [name] to release information regarding their care.   Consent: Patient/Guardian gives verbal consent for treatment and assignment of benefits for services provided during this visit. Patient/Guardian expressed understanding and agreed to proceed.   Diagnosis: Severe episode of recurrent major depressive disorder, without psychotic features ([place]) [F33.2]    1. Severe episode of recurrent major depressive disorder, without psychotic features ([place])   2. GAD (generalized anxiety disorder)       Lorin Glass, LCSW 02/10/2023

## 2023-02-10 NOTE — Therapy (Signed)
[place], [place], [address] Phone: [phone]   Fax:  [phone]  Occupational Therapy Treatment Virtual Visit via Video Note  I connected with Katherine Steele on 02/10/23 at  8:00 AM EST by a video enabled telemedicine application and verified that I am speaking with the correct person using two identifiers.  Location: Patient: home Provider: office   I discussed the limitations of evaluation and management by telemedicine and the availability of in person appointments. The patient expressed understanding and agreed to proceed.    The patient was advised to call back or seek an in-person evaluation if the symptoms worsen or if the condition fails to improve as anticipated.  I provided 55 minutes of non-face-to-face time during this encounter.   Patient Details  Name: Katherine Steele MRN: [id number] Date of Birth: 09-29-1971 No data recorded  Encounter Date: 02/08/2023   OT End of Session - 02/10/23 1017     Visit Number 7    Number of Visits 20    Date for OT Re-Evaluation 03/02/23    OT Start Time 1200    OT Stop Time 1255    OT Time Calculation (min) 55 min             Past Medical History:  Diagnosis Date   BMI 33.0-33.9,adult    Endometriosis    s/p hysterectomy   GAD (generalized anxiety disorder)    Dr. Annitta [place] psychiatrist   Gender dysphoria 2014   did undergo 10 mo testosterone treatment   GERD (gastroesophageal reflux disease)    HLD (hyperlipidemia) 08/12/2014   Mild off meds    MDD (major depressive disorder)    Dr. Annitta Jersey psychiatrist   PONV (postoperative nausea and vomiting)    after finger surgery    Past Surgical History:  Procedure Laterality Date   CESAREAN SECTION     COLONOSCOPY  2007   WNL [facility])   COLONOSCOPY WITH PROPOFOL N/A 03/31/2021   WNL, rpt 10 yrs Bonna Gains, Gibsonia B, MD)   ESOPHAGOGASTRODUODENOSCOPY (EGD) WITH PROPOFOL N/A 03/21/2018    barrett's without dysplasia, mild chronic gastritis, reflux gastroesophagitis Bonna Gains, [name], MD)   ESOPHAGOGASTRODUODENOSCOPY (EGD) WITH PROPOFOL N/A 03/31/2021   small HH, shatski ring, ?barretts - biopsies WNL Bonna Gains, Lennette Bihari, MD)   FINGER SURGERY Right 2007   little finger   OOPHORECTOMY Left 2006   TONSILLECTOMY  1975   TOTAL ABDOMINAL HYSTERECTOMY  12/2012   endometriosis and ovarian cysts s/p ovaries removed   TYMPANOSTOMY TUBE PLACEMENT Bilateral 1976    There were no vitals filed for this visit.   Subjective Assessment - 02/10/23 1016     Currently in Pain? No/denies    Pain Score 0-No pain                Group Session:  S: doing better today I believe.  O: The primary objective of integrating mindfulness and meditation into the therapeutic process for this group of adults is to enhance their daily functioning and overall well-being, despite the challenges posed by depression, anxiety, and the distractions of modern life, including technology. This objective will be pursued through the following measurable and observable components: Mindfulness Practice: Track frequency, duration, and types of mindfulness activities. Concentration and Attention: Assess improvements in focus and present-moment engagement. Depression and Anxiety Symptoms: Measure changes using standardized scales and self-reports. Emotional Regulation: Observe advancements in managing emotional responses and stress. Physical Health and Sleep: Monitor  changes in health indicators and sleep quality related to mindfulness practice. Social Functioning: Document shifts in interpersonal relationships, communication, and empathy. These concise objectives aim to quantify the impact of mindfulness and meditation on enhancing life quality for participants, allowing for targeted adjustments to maximize therapeutic outcomes.   A: This patient, while less verbally active in group discussions,  exhibits a high level of attentiveness and presence during sessions. They consistently attend and participate through non-verbal means, such as mindful listening and practicing meditation exercises with the group. The patient's passive engagement does not detract from their therapeutic progress; rather, they have shown improvements in managing symptoms of depression and anxiety, as noted in subtle behavioral changes and self-reported measures. They demonstrate increased ability to stay present, a reduction in reactive behaviors to emotional triggers, and a gradual improvement in sleep quality and physical health indicators related to stress. The patient's attentive presence and consistent participation in mindfulness and meditation practices suggest a deepening understanding and application of these techniques to enhance their well-being and daily life functioning.   P: Continue to attend PHP OT group sessions 5x week for 4 weeks to promote daily structure, social engagement, and opportunities to develop and utilize adaptive strategies to maximize functional performance in preparation for safe transition and integration back into school, work, and the community. Plan to address topic of tbd in next OT group session.                   OT Education - 02/10/23 1017     Education Details Mindfulness and Meditation Practices              OT Short Term Goals - 01/31/23 1907       OT SHORT TERM GOAL #1   Title Client will develop and utilize a personalized coping toolbox containing at least five coping strategies to manage challenging situations, demonstrating their use in real-life scenarios by the end of therapy.    Time 4    Period Weeks    Status On-going    Target Date 03/02/23      OT SHORT TERM GOAL #2   Title By discharge, client will demonstrate the ability to adapt and modify routines when faced with unexpected events or changes, maintaining a balanced approach to daily  tasks.      OT SHORT TERM GOAL #3   Title By the time of discharge, client will independently set, track, and make progress towards a long-term goal, demonstrating resilience in overcoming obstacles and seeking support when needed.                      Plan - 02/10/23 1017     Psychosocial Skills Interpersonal Interaction;Routines and Behaviors;Habits;Coping Strategies             Patient will benefit from skilled therapeutic intervention in order to improve the following deficits and impairments:       Psychosocial Skills: Interpersonal Interaction, Routines and Behaviors, Habits, Coping Strategies   Visit Diagnosis: Difficulty coping    Problem List Patient Active Problem List   Diagnosis Date Noted   MDD (major depressive disorder), recurrent episode, severe ([place]) 01/24/2023   Gender dysphoria 11/30/2022   Long term current use of cannabis 11/30/2022   High risk medication use 11/30/2022   Anxiety attack 09/28/2022   Adult attention deficit disorder 02/14/2021   Obesity, Class I, BMI 30-34.9    Surgical menopause, asymptomatic 12/09/2018   Schatzki's ring  Barrett's esophagus    Gastroesophageal reflux disease    Esophageal dysphagia    Hiatal hernia    Acquired trigger finger 09/19/2016   HLD (hyperlipidemia) 08/12/2014   Health maintenance examination 05/04/2014   Gender dysphoria in adult 07/17/2012   Anxiety disorder 02/07/2012   MDD (major depressive disorder), recurrent episode, moderate ([place]) 02/07/2012    Brantley Stage, OT 02/10/2023, 10:17 AM  Cornell Barman, OT   [facility] [place], [place], [address] Phone: [phone]   Fax:  [phone]  Name: Katherine Steele MRN: [id number] Date of Birth: 03/13/1971

## 2023-02-11 ENCOUNTER — Ambulatory Visit (HOSPITAL_COMMUNITY): Payer: BC Managed Care – PPO

## 2023-02-11 ENCOUNTER — Other Ambulatory Visit (HOSPITAL_COMMUNITY): Payer: BC Managed Care – PPO | Attending: Psychiatry

## 2023-02-11 ENCOUNTER — Encounter (HOSPITAL_COMMUNITY): Payer: Self-pay

## 2023-02-11 ENCOUNTER — Other Ambulatory Visit (HOSPITAL_COMMUNITY): Payer: BC Managed Care – PPO | Attending: Psychiatry | Admitting: Licensed Clinical Social Worker

## 2023-02-11 DIAGNOSIS — R4589 Other symptoms and signs involving emotional state: Secondary | ICD-10-CM | POA: Diagnosis not present

## 2023-02-11 DIAGNOSIS — F332 Major depressive disorder, recurrent severe without psychotic features: Secondary | ICD-10-CM | POA: Insufficient documentation

## 2023-02-11 DIAGNOSIS — F411 Generalized anxiety disorder: Secondary | ICD-10-CM | POA: Insufficient documentation

## 2023-02-11 NOTE — Progress Notes (Signed)
Spoke with patient via WebEx video call, used 2 identifiers to correctly identify patient. States that groups are going well. Has had some trouble with concentration and feels brain fog. Believes it became worse when she started [employer]il[employer]. She is wondering how it will affect her ability to work. She will continue to monitor to see if its a problem or gets better. On scale 1-10 as 10 being worst she rates depression at 5 and anxiety at 5. Denies SI/HI or AV hallucinations. No issues or complaints. Bright, cheerful affect.

## 2023-02-11 NOTE — Psych (Signed)
Virtual Visit via Video Note  I connected with Katherine Steele on 02/08/23 at  9:00 AM EST by a video enabled telemedicine application and verified that I am speaking with the correct person using two identifiers.  Location: Patient: patient home Provider: clinical home office   I discussed the limitations of evaluation and management by telemedicine and the availability of in person appointments. The patient expressed understanding and agreed to proceed.  I discussed the assessment and treatment plan with the patient. The patient was provided an opportunity to ask questions and all were answered. The patient agreed with the plan and demonstrated an understanding of the instructions.   The patient was advised to call back or seek an in-person evaluation if the symptoms worsen or if the condition fails to improve as anticipated.  Pt was provided 240 minutes of non-face-to-face time during this encounter.   Lorin Glass, LCSW   [facility] BH PHP THERAPIST PROGRESS NOTE  NYKEBA LINGLE [id number]  Session Time: 9:00- 10:00  Participation Level: Active  Behavioral Response: CasualAlertDepressed  Type of Therapy: Group Therapy  Treatment Goals addressed: Coping  Progress Towards Goals: Progressing  Interventions: CBT, DBT, Supportive, and Reframing  Summary: Katherine Steele is a 52 y.o. female who presents with depression and anxiety symptoms.  Clinician led check-in regarding current stressors and situation, and review of patient completed daily inventory. Clinician utilized active listening and empathetic response and validated patient emotions. Clinician facilitated processing group on pertinent issues.?    Therapist Response: Patient arrived within time allowed. Patient rates her mood at a 7 on a scale of 1-10 with 10 being best. Pt states she feels "okay." Pt states she slept 8 hours and ate 3x. Pt reports she thinks her meds are working because she feels more capable to overcome  stressors and mood fluctuations. Pt states she took a walk and had dinner with her family. Pt reports continued anxieties regarding work. Patient able to process. Patient engaged in discussion.             Session Time: 10:00 am - 11:00 am   Participation Level: Active   Behavioral Response: CasualAlertDepressed   Type of Therapy: Group Therapy   Treatment Goals addressed: Coping   Progress Towards Goals: Progressing   Interventions: CBT, DBT, Solution Focused, Strength-based, Supportive, and Reframing   Therapist Response:  Cln led discussion on ways to manage stressors and feelings over the weekend. Group members  brainstormed things to do over the weekend for multiple levels of energy, access, and moods. Cln reviewed crisis services should they be needed and provided pt's with the text crisis line, mobile crisis, national suicide hotline, [facility] 24/7 line, and information on [facility].      Therapist Response: Pt engaged in discussion and is able to identify 3 ideas of what to do over the weekend to keep their mind engaged.          Session Time: 11:00 -12:00   Participation Level: Active   Behavioral Response: CasualAlertDepressed   Type of Therapy: Group Therapy, Spiritual Care   Treatment Goals addressed: Coping   Progress Towards Goals: Progressing   Interventions: Supportive, Education   Summary:  Katherine Steele, Chaplain, led group.   Therapist Response: Pt participated           Session Time: 12:00 -1:00   Participation Level: Active   Behavioral Response: CasualAlertDepressed   Type of Therapy: Group therapy, Occupational Therapy   Treatment Goals addressed: Coping  Progress Towards Goals: Progressing   Interventions: Supportive; Psychoeducation   Summary: 12:00 - 12:50: Occupational Therapy group led by cln E. Hollan. 12:50 - 1:00 Clinician assessed for immediate needs, medication compliance and efficacy, and safety concerns.   Therapist  Response: 12:00 - 12:50: See OT note 12:50 - 1:00 pm: At check-out, patient reports no immediate concerns. Patient demonstrates progress as evidenced by continued engagement in group and responsiveness to treatment. Patient denies SI/HI/self-harm thoughts at the end of group.    Suicidal/Homicidal: Nowithout intent/plan  Plan: Pt will continue in PHP while working to decrease depression and anxiety symptoms, decrease negative response to stressors, and increase ability to manage symptoms in a healthy manner.   Collaboration of Care: Medication Management AEB A Pashayan  Patient/Guardian was advised Release of Information must be obtained prior to any record release in order to collaborate their care with an outside provider. Patient/Guardian was advised if they have not already done so to contact the registration department to sign all necessary forms in order for [name] to release information regarding their care.   Consent: Patient/Guardian gives verbal consent for treatment and assignment of benefits for services provided during this visit. Patient/Guardian expressed understanding and agreed to proceed.   Diagnosis: Severe episode of recurrent major depressive disorder, without psychotic features ([place]) [F33.2]    1. Severe episode of recurrent major depressive disorder, without psychotic features ([place])   2. GAD (generalized anxiety disorder)       Lorin Glass, LCSW 02/11/2023

## 2023-02-11 NOTE — Psych (Signed)
Virtual Visit via Video Note  I connected with Katherine Steele on 02/06/23 at  9:00 AM EST by a video enabled telemedicine application and verified that I am speaking with the correct person using two identifiers.  Location: Patient: patient home Provider: clinical home office   I discussed the limitations of evaluation and management by telemedicine and the availability of in person appointments. The patient expressed understanding and agreed to proceed.  I discussed the assessment and treatment plan with the patient. The patient was provided an opportunity to ask questions and all were answered. The patient agreed with the plan and demonstrated an understanding of the instructions.   The patient was advised to call back or seek an in-person evaluation if the symptoms worsen or if the condition fails to improve as anticipated.  Pt was provided 240 minutes of non-face-to-face time during this encounter.   Lorin Glass, LCSW   [facility] BH PHP THERAPIST PROGRESS NOTE  Katherine Steele [id number]  Session Time: 9:00- 10:00  Participation Level: Active  Behavioral Response: CasualAlertDepressed  Type of Therapy: Group Therapy  Treatment Goals addressed: Coping  Progress Towards Goals: Progressing  Interventions: CBT, DBT, Supportive, and Reframing  Summary: Katherine Steele is a 52 y.o. female who presents with depression and anxiety symptoms.  Clinician led check-in regarding current stressors and situation, and review of patient completed daily inventory. Clinician utilized active listening and empathetic response and validated patient emotions. Clinician facilitated processing group on pertinent issues.?    Therapist Response: Patient arrived within time allowed. Patient rates her mood at a 7 on a scale of 1-10 with 10 being best. Pt states she feels "pretty good." Pt states she slept 8 hours and ate 3x. Pt reports getting adjusted to new medication and sleeping better. Pt states she  did not  leave the house yesterday. Pt reports she received a phone call about a job application she put in which increased her hopefulness. Pt reports irritability re: an issue with her husband and feeling unsupported. Pt continues to struggle with managing negative thinking. Patient able to process. Patient engaged in discussion.             Session Time: 10:00 am - 11:00 am   Participation Level: Active   Behavioral Response: CasualAlertDepressed   Type of Therapy: Group Therapy   Treatment Goals addressed: Coping   Progress Towards Goals: Progressing   Interventions: CBT, DBT, Solution Focused, Strength-based, Supportive, and Reframing   Therapist Response: Cln led discussion on coping skills that can help in intense feelings. Cln discussed DBT TIP skills of temperature and intense exercise. Cln shared DBT ACCEPTS sensation skill. Group discussed how to apply these skills and how to incorporate them into their coping plans.    Therapist Response: Pt engaged in discussion and was able to brainstorm ways to apply skills.            Session Time: 11:00 -12:00   Participation Level: Active   Behavioral Response: CasualAlertDepressed   Type of Therapy: Group Therapy   Treatment Goals addressed: Coping   Progress Towards Goals: Progressing   Interventions: CBT, DBT, Solution Focused, Strength-based, Supportive, and Reframing   Summary: Cln continued topic of boundaries. Cln discussed the different ways boundaries present: physical, emotional, intellectual, sexual, material, and time. Group talked about the ways in which each type presents for them and is a struggle.    Therapist Response:  Pt engaged in discussion and is able to discuss ways in which the  different boundary types present in their own life.          Session Time: 12:00 -1:00   Participation Level: Active   Behavioral Response: CasualAlertDepressed   Type of Therapy: Group therapy   Treatment Goals  addressed: Coping   Progress Towards Goals: Progressing   Interventions: CBT, DBT, Solution Focused, Strength-based, Supportive, and Reframing   Summary: 12:00 - 12:50: Cln continued topic of boundaries and introduced how to set and maintain healthy boundaries. Cln utilized handout "how to set boundaries" and group members worked through examples to practice setting appropriate boundaries.  12:50 - 1:00 Clinician assessed for immediate needs, medication compliance and efficacy, and safety concerns.   Therapist Response: 12:00 - 12:50: Pt engaged in discussion and demonstrates understanding through practice.  12:50 - 1:00 pm: At check-out, patient reports no immediate concerns. Patient demonstrates progress as evidenced by continued engagement in group and responsiveness to treatment. Patient denies SI/HI/self-harm thoughts at the end of group.    Suicidal/Homicidal: Nowithout intent/plan  Plan: Pt will continue in PHP while working to decrease depression and anxiety symptoms, decrease negative response to stressors, and increase ability to manage symptoms in a healthy manner.   Collaboration of Care: Medication Management AEB A Pashayan  Patient/Guardian was advised Release of Information must be obtained prior to any record release in order to collaborate their care with an outside provider. Patient/Guardian was advised if they have not already done so to contact the registration department to sign all necessary forms in order for [name] to release information regarding their care.   Consent: Patient/Guardian gives verbal consent for treatment and assignment of benefits for services provided during this visit. Patient/Guardian expressed understanding and agreed to proceed.   Diagnosis: Severe episode of recurrent major depressive disorder, without psychotic features ([place]) [F33.2]    1. Severe episode of recurrent major depressive disorder, without psychotic features ([place])   2. GAD  (generalized anxiety disorder)       Lorin Glass, LCSW 02/11/2023

## 2023-02-11 NOTE — Psych (Signed)
Virtual Visit via Video Note  I connected with Katherine Steele on 02/07/23 at  9:00 AM EST by a video enabled telemedicine application and verified that I am speaking with the correct person using two identifiers.  Location: Patient: patient home Provider: clinical home office   I discussed the limitations of evaluation and management by telemedicine and the availability of in person appointments. The patient expressed understanding and agreed to proceed.  I discussed the assessment and treatment plan with the patient. The patient was provided an opportunity to ask questions and all were answered. The patient agreed with the plan and demonstrated an understanding of the instructions.   The patient was advised to call back or seek an in-person evaluation if the symptoms worsen or if the condition fails to improve as anticipated.  Pt was provided 240 minutes of non-face-to-face time during this encounter.   Lorin Glass, LCSW   [facility] BH PHP THERAPIST PROGRESS NOTE  Katherine Steele [id number]  Session Time: 9:00- 10:00  Participation Level: Active  Behavioral Response: CasualAlertDepressed  Type of Therapy: Group Therapy  Treatment Goals addressed: Coping  Progress Towards Goals: Progressing  Interventions: CBT, DBT, Supportive, and Reframing  Summary: Katherine Steele is a 52 y.o. female who presents with depression and anxiety symptoms.  Clinician led check-in regarding current stressors and situation, and review of patient completed daily inventory. Clinician utilized active listening and empathetic response and validated patient emotions. Clinician facilitated processing group on pertinent issues.?    Therapist Response: Patient arrived within time allowed. Patient rates her mood at a 7 on a scale of 1-10 with 10 being best. Pt states she feels "okay." Pt states she slept 7 hours and ate 3x. Pt reports yesterday was "chill" and she took a nap and stayed at home. Pt reports  rumination on a social dynamic at work which creates panic in re: to returning to work.  Patient able to process. Patient engaged in discussion.             Session Time: 10:00 am - 11:00 am   Participation Level: Active   Behavioral Response: CasualAlertDepressed   Type of Therapy: Group Therapy   Treatment Goals addressed: Coping   Progress Towards Goals: Progressing   Interventions: CBT, DBT, Solution Focused, Strength-based, Supportive, and Reframing   Therapist Response: Cln led discussion on planning ahead as a way to mitigate anxiety. Group members shared worries about keeping up good habits when returning to "normal" life post-treatment. Group able to brainstorm ways to build habits now and how they can fit into their life after treatment.    Therapist Response: Pt engaged in discussion and identified ways to plan ahead for current anxieties.            Session Time: 11:00 -12:00   Participation Level: Active   Behavioral Response: CasualAlertDepressed   Type of Therapy: Group Therapy   Treatment Goals addressed: Coping   Progress Towards Goals: Progressing   Interventions: CBT, DBT, Solution Focused, Strength-based, Supportive, and Reframing   Summary: Cln continued topic of boundaries and led a "boundary workshop" in which group members brought current boundary issues and group worked together to apply boundary concepts to help address the concern. Cln helped shape conversation to maintain fidelity.    Therapist Response:  Pt participated in discussion and reports gaining insight.            Session Time: 12:00 -1:00   Participation Level: Active   Behavioral Response: CasualAlertDepressed  Type of Therapy: Group therapy, Occupational Therapy   Treatment Goals addressed: Coping   Progress Towards Goals: Progressing   Interventions: Supportive; Psychoeducation   Summary: 12:00 - 12:50: Occupational Therapy group led by cln E. Hollan. 12:50 -  1:00 Clinician assessed for immediate needs, medication compliance and efficacy, and safety concerns.   Therapist Response: 12:00 - 12:50: See OT note 12:50 - 1:00 pm: At check-out, patient reports no immediate concerns. Patient demonstrates progress as evidenced by continued engagement in group and responsiveness to treatment. Patient denies SI/HI/self-harm thoughts at the end of group.    Suicidal/Homicidal: Nowithout intent/plan  Plan: Pt will continue in PHP while working to decrease depression and anxiety symptoms, decrease negative response to stressors, and increase ability to manage symptoms in a healthy manner.   Collaboration of Care: Medication Management AEB A Pashayan  Patient/Guardian was advised Release of Information must be obtained prior to any record release in order to collaborate their care with an outside provider. Patient/Guardian was advised if they have not already done so to contact the registration department to sign all necessary forms in order for [name] to release information regarding their care.   Consent: Patient/Guardian gives verbal consent for treatment and assignment of benefits for services provided during this visit. Patient/Guardian expressed understanding and agreed to proceed.   Diagnosis: Severe episode of recurrent major depressive disorder, without psychotic features (Katherine Steele) [F33.2]    1. Severe episode of recurrent major depressive disorder, without psychotic features (Katherine Steele)   2. GAD (generalized anxiety disorder)   3. Gender dysphoria in adult       Katherine Steele, Katherine Steele 02/11/2023

## 2023-02-11 NOTE — Therapy (Signed)
[facility] [place], [place], [address] Phone: [phone]   Fax:  [phone]  Occupational Therapy Treatment Virtual Visit via Video Note  I connected with [name] on 02/11/23 at  8:00 AM EST by a video enabled telemedicine application and verified that I am speaking with the correct person using two identifiers.  Location: Patient: home Provider: office   I discussed the limitations of evaluation and management by telemedicine and the availability of in person appointments. The patient expressed understanding and agreed to proceed.    The patient was advised to call back or seek an in-person evaluation if the symptoms worsen or if the condition fails to improve as anticipated.  I provided 55 minutes of non-face-to-face time during this encounter.   Patient Details  Name: Katherine Steele MRN: [id number] Date of Birth: [birth year] No data recorded  Encounter Date: 02/11/2023   OT End of Session - 02/11/23 2027     Visit Number 8    Number of Visits 20    Date for OT Re-Evaluation 03/02/23    OT Start Time 1200    OT Stop Time 1255    OT Time Calculation (min) 55 min             Past Medical History:  Diagnosis Date   BMI 33.0-33.9,adult    Endometriosis    s/p hysterectomy   GAD (generalized anxiety disorder)    Dr. Annitta [place] psychiatrist   Gender dysphoria 2014   did undergo 10 mo testosterone treatment   GERD (gastroesophageal reflux disease)    HLD (hyperlipidemia) 08/12/2014   Mild off meds    MDD (major depressive disorder)    Dr. Annitta [place] psychiatrist   PONV (postoperative nausea and vomiting)    after finger surgery    Past Surgical History:  Procedure Laterality Date   CESAREAN SECTION     COLONOSCOPY  2007   WNL [facility])   COLONOSCOPY WITH PROPOFOL N/A 03/31/2021   WNL, rpt 10 yrs Bonna Gains, [place] B, MD)   ESOPHAGOGASTRODUODENOSCOPY (EGD) WITH PROPOFOL N/A 03/21/2018    barrett's without dysplasia, mild chronic gastritis, reflux gastroesophagitis Bonna Gains, [name], MD)   ESOPHAGOGASTRODUODENOSCOPY (EGD) WITH PROPOFOL N/A 03/31/2021   small HH, shatski ring, ?barretts - biopsies WNL Bonna Gains, Lennette Bihari, MD)   FINGER SURGERY Right 2007   little finger   OOPHORECTOMY Left 2006   TONSILLECTOMY  1975   TOTAL ABDOMINAL HYSTERECTOMY  12/2012   endometriosis and ovarian cysts s/p ovaries removed   TYMPANOSTOMY TUBE PLACEMENT Bilateral 1976    There were no vitals filed for this visit.   Subjective Assessment - 02/11/23 2027     Currently in Pain? No/denies    Pain Score 0-No pain                Group Session:  S: Doing better today.   O: The primary objective of integrating mindfulness and meditation into the therapeutic process for this group of adults is to enhance their daily functioning and overall well-being, despite the challenges posed by depression, anxiety, and the distractions of modern life, including technology. This objective will be pursued through the following measurable and observable components: Mindfulness Practice: Track frequency, duration, and types of mindfulness activities. Concentration and Attention: Assess improvements in focus and present-moment engagement. Depression and Anxiety Symptoms: Measure changes using standardized scales and self-reports. Emotional Regulation: Observe advancements in managing emotional responses and stress. Physical Health and Sleep: Monitor changes  in health indicators and sleep quality related to mindfulness practice. Social Functioning: Document shifts in interpersonal relationships, communication, and empathy. These concise objectives aim to quantify the impact of mindfulness and meditation on enhancing life quality for participants, allowing for targeted adjustments to maximize therapeutic outcomes.   A: The patient has shown significant engagement in the group sessions, actively  participating in discussions, sharing personal experiences, and consistently practicing mindfulness and meditation exercises both within and outside of the therapy setting. This active involvement has correlated with noticeable improvements in their ability to manage symptoms of depression and anxiety, as evidenced by self-reports and observable changes in behavior. The patient demonstrates enhanced concentration and emotional regulation, actively applies coping strategies learned in sessions, and reports increased satisfaction with interpersonal relationships and daily functioning. The proactive approach to integrating mindfulness practices into their life suggests a positive trajectory in their mental health journey, with mindfulness serving as a beneficial tool for managing stress and emotional challenges.   P: Continue to attend PHP OT group sessions 5x week for 4 weeks to promote daily structure, social engagement, and opportunities to develop and utilize adaptive strategies to maximize functional performance in preparation for safe transition and integration back into school, work, and the community. Plan to address topic of tbd in next OT group session.                   OT Education - 02/11/23 2027     Education Details Mindfulness and Meditation Practices              OT Short Term Goals - 01/31/23 1907       OT SHORT TERM GOAL #1   Title Client will develop and utilize a personalized coping toolbox containing at least five coping strategies to manage challenging situations, demonstrating their use in real-life scenarios by the end of therapy.    Time 4    Period Weeks    Status On-going    Target Date 03/02/23      OT SHORT TERM GOAL #2   Title By discharge, client will demonstrate the ability to adapt and modify routines when faced with unexpected events or changes, maintaining a balanced approach to daily tasks.      OT SHORT TERM GOAL #3   Title By the time  of discharge, client will independently set, track, and make progress towards a long-term goal, demonstrating resilience in overcoming obstacles and seeking support when needed.                      Plan - 02/11/23 2027     Psychosocial Skills Interpersonal Interaction;Routines and Behaviors;Habits;Coping Strategies             Patient will benefit from skilled therapeutic intervention in order to improve the following deficits and impairments:       Psychosocial Skills: Interpersonal Interaction, Routines and Behaviors, Habits, Coping Strategies   Visit Diagnosis: Difficulty coping    Problem List Patient Active Problem List   Diagnosis Date Noted   MDD (major depressive disorder), recurrent episode, severe ([place]) 01/24/2023   Gender dysphoria 11/30/2022   Long term current use of cannabis 11/30/2022   High risk medication use 11/30/2022   Anxiety attack 09/28/2022   Adult attention deficit disorder 02/14/2021   Obesity, Class I, BMI 30-34.9    Surgical menopause, asymptomatic 12/09/2018   Schatzki's ring    Barrett's esophagus    Gastroesophageal reflux disease    Esophageal dysphagia  Hiatal hernia    Acquired trigger finger 09/19/2016   HLD (hyperlipidemia) 08/12/2014   Health maintenance examination 05/04/2014   Gender dysphoria in adult 07/17/2012   Anxiety disorder 02/07/2012   MDD (major depressive disorder), recurrent episode, moderate ([place]) 02/07/2012    Brantley Stage, OT 02/11/2023, 8:28 PM  Cornell Barman, OT   [facility] HOSPITALIZATION PROGRAM [address] [place], [place], [address] Phone: [phone]   Fax:  [phone]  Name: YUI KIPNIS MRN: [id number] Date of Birth: Dec 08, 1971

## 2023-02-12 ENCOUNTER — Other Ambulatory Visit (HOSPITAL_COMMUNITY): Payer: BC Managed Care – PPO | Attending: Psychiatry | Admitting: Licensed Clinical Social Worker

## 2023-02-12 ENCOUNTER — Other Ambulatory Visit (HOSPITAL_COMMUNITY): Payer: BC Managed Care – PPO | Attending: Psychiatry

## 2023-02-12 DIAGNOSIS — Z79899 Other long term (current) drug therapy: Secondary | ICD-10-CM | POA: Diagnosis not present

## 2023-02-12 DIAGNOSIS — F332 Major depressive disorder, recurrent severe without psychotic features: Secondary | ICD-10-CM | POA: Diagnosis not present

## 2023-02-12 DIAGNOSIS — F411 Generalized anxiety disorder: Secondary | ICD-10-CM | POA: Diagnosis not present

## 2023-02-12 DIAGNOSIS — R4589 Other symptoms and signs involving emotional state: Secondary | ICD-10-CM | POA: Diagnosis not present

## 2023-02-12 DIAGNOSIS — Z9151 Personal history of suicidal behavior: Secondary | ICD-10-CM | POA: Insufficient documentation

## 2023-02-12 DIAGNOSIS — F640 Transsexualism: Secondary | ICD-10-CM | POA: Insufficient documentation

## 2023-02-12 DIAGNOSIS — Z87891 Personal history of nicotine dependence: Secondary | ICD-10-CM | POA: Diagnosis not present

## 2023-02-12 NOTE — Progress Notes (Signed)
BH MD/PA/NP PHP Progress Note  Virtual Visit via Video Note  I connected with Katherine Steele on 02/12/23 at  9:00 AM EST by a video enabled telemedicine application and verified that I am speaking with the correct person using two identifiers.  Location: Patient: Home Provider: [facility]   I discussed the limitations of evaluation and management by telemedicine and the availability of in person appointments. The patient expressed understanding and agreed to proceed.  02/12/2023 3:39 PM Katherine Steele  MRN:  [id number]  Chief Complaint:  Chief Complaint  Patient presents with   Follow-up   Depression   Anxiety   HPI:  Katherine Steele is a 52 yr old female who presents via Virtual Video Visit for Follow Up and Medication Management, she enrolled in the [facility] on 01/29/2023.  PPHx is significant for Depression, GAD, and Gender Dysphoria, and 2 Suicide Attempts Via OD (last 01/2023) and 2 Psychiatric Hospitalizations (last- [facility] 01/2023), and no history of Self Injurious Behavior.    She reports that this morning she is not in the best of spirits because she and her husband got into an argument about their relationship.  She reports that she does plan to discuss this in group today.  She reports that her sleep has continued to improve since the first night after starting the Abilify.  She reports she also had some brain fogginess but that this too has been improving.  She reports that she has been having issues with attention since she was young.  She inquired about treatment.  Discussed that she should get formal testing and that when there is also depression/anxiety these are often treated first since there is significant overlap of symptoms.  Discussed this would allow her time to get the testing to bring to her outpatient provider.  She was agreeable to this.  She reports no SI, HI, or AVH.  She reports her sleep is good.  She reports her appetite is good.  She reports no other concerns at  present.   Visit Diagnosis:    ICD-10-CM   1. Severe episode of recurrent major depressive disorder, without psychotic features ([place])  F33.2     2. GAD (generalized anxiety disorder)  F41.1     3. Gender dysphoria in adult  F64.0       Past Psychiatric History: Depression, GAD, and Gender Dysphoria, and 2 Suicide Attempts Via OD (last 01/2023) and 2 Psychiatric Hospitalizations (last- [facility] 01/2023), and no history of Self Injurious Behavior.    Past Medical History:  Past Medical History:  Diagnosis Date   BMI 33.0-33.9,adult    Endometriosis    s/p hysterectomy   GAD (generalized anxiety disorder)    Dr. Annitta [place] psychiatrist   Gender dysphoria 2014   did undergo 10 mo testosterone treatment   GERD (gastroesophageal reflux disease)    HLD (hyperlipidemia) 08/12/2014   Mild off meds    MDD (major depressive disorder)    Dr. Annitta Jersey psychiatrist   PONV (postoperative nausea and vomiting)    after finger surgery    Past Surgical History:  Procedure Laterality Date   CESAREAN SECTION     COLONOSCOPY  2007   WNL [facility])   COLONOSCOPY WITH PROPOFOL N/A 03/31/2021   WNL, rpt 10 yrs Bonna Gains, [place] B, MD)   ESOPHAGOGASTRODUODENOSCOPY (EGD) WITH PROPOFOL N/A 03/21/2018   barrett's without dysplasia, mild chronic gastritis, reflux gastroesophagitis Bonna Gains, [name], MD)   ESOPHAGOGASTRODUODENOSCOPY (EGD) WITH PROPOFOL N/A 03/31/2021   small HH,  shatski ring, ?barretts - biopsies WNL Bonna Gains, [name], MD)   FINGER SURGERY Right 2007   little finger   OOPHORECTOMY Left 2006   TONSILLECTOMY  1975   TOTAL ABDOMINAL HYSTERECTOMY  12/2012   endometriosis and ovarian cysts s/p ovaries removed   TYMPANOSTOMY TUBE PLACEMENT Bilateral 1976    Family Psychiatric History: Father- EtOH Abuse Son- High Functioning Autism Cousin- Low Functioning Autism Paternal Cousins x2 and Interior and spatial designer- Completed Suicide Multiple Paternal Aunts and Uncles- EtOH Abuse  Family History:  Family  History  Problem Relation Age of Onset   Diabetes Mother    Cancer Mother 80       ovarian with mets   Rheum arthritis Mother    Hypertension Father    Alcohol abuse Father        history   Rheum arthritis Maternal Aunt    CAD Maternal Uncle 27       MI   Depression Paternal Uncle    Alcohol abuse Paternal Uncle    Suicidality Paternal Uncle    CAD Maternal Grandfather 87       MI   Depression Cousin    Alcohol abuse Cousin    Suicidality Cousin    Drug abuse Cousin    Suicidality Cousin    Anxiety disorder Son    Autism spectrum disorder Son    Stroke Neg Hx     Social History:  Social History   Socioeconomic History   Marital status: Married    Spouse name: michael   Number of children: 1   Years of education: Not on file   Highest education level: Associate degree: academic program  Occupational History   Not on file  Tobacco Use   Smoking status: Former    Types: Cigarettes    Quit date: 02/06/1993    Years since quitting: 30.0   Smokeless tobacco: Never  Vaping Use   Vaping Use: Never used  Substance and Sexual Activity   Alcohol use: Not Currently    Comment: social/weekends   Drug use: Not Currently    Types: Marijuana    Comment: stopped 2 weeks ago smoking marijuana   Sexual activity: Yes  Other Topics Concern   Not on file  Social History Narrative   Lives with husband Ronalee Belts) and son Remo Lipps)   Occupation: Customer service manager   Edu: Associate's degree   Activity: exercise 3x/wk   Diet: good water, fruits/vegetables some   Social Determinants of Health   Financial Resource Strain: Not on file  Food Insecurity: No Food Insecurity (01/18/2023)   Hunger Vital Sign    Worried About Running Out of Food in the Last Year: Never true    [place] in the Last Year: Never true  Transportation Needs: No Transportation Needs (01/18/2023)   PRAPARE - Hydrologist (Medical): No    Lack of Transportation (Non-Medical):  No  Physical Activity: Not on file  Stress: Not on file  Social Connections: Not on file    Allergies: No Known Allergies  Metabolic Disorder Labs: No results found for: "HGBA1C", "MPG" No results found for: "PROLACTIN" Lab Results  Component Value Date   CHOL 236 (H) 01/16/2023   TRIG 142.0 01/16/2023   HDL 58.70 01/16/2023   CHOLHDL 4 01/16/2023   VLDL 28.4 01/16/2023   LDLCALC 149 (H) 01/16/2023   LDLCALC 125 (H) 12/29/2021   Lab Results  Component Value Date   TSH 1.793 12/20/2022   TSH  2.88 06/26/2021    Therapeutic Level Labs: No results found for: "LITHIUM" No results found for: "VALPROATE" No results found for: "CBMZ"  Current Medications: Current Outpatient Medications  Medication Sig Dispense Refill   ARIPiprazole (ABILIFY) 2 MG tablet Take 1 tablet (2 mg total) by mouth daily. 30 tablet 1   omeprazole (PRILOSEC) 40 MG capsule Take 1 capsule (40 mg total) by mouth daily. 30 capsule 0   rosuvastatin (CRESTOR) 10 MG tablet Take 1 tablet (10 mg total) by mouth daily after supper. 90 tablet 3   venlafaxine XR (EFFEXOR XR) 37.5 MG 24 hr capsule Take 1 capsule (37.5 mg total) by mouth daily with breakfast. Take along with 75 mg daily 30 capsule 0   venlafaxine XR (EFFEXOR XR) 75 MG 24 hr capsule Take 1 capsule (75 mg total) by mouth daily with breakfast. 30 capsule 0   No current facility-administered medications for this visit.     Musculoskeletal: Strength & Muscle Tone: within normal limits Gait & Station:  Sitting During Interview Patient leans: N/A  Psychiatric Specialty Exam: Review of Systems  Respiratory:  Negative for shortness of breath.   Cardiovascular:  Negative for chest pain.  Gastrointestinal:  Negative for abdominal pain, constipation, diarrhea, nausea and vomiting.  Neurological:  Negative for dizziness, weakness and headaches.  Psychiatric/Behavioral:  Positive for dysphoric mood. Negative for hallucinations, sleep disturbance and  suicidal ideas. The patient is not nervous/anxious.     Last menstrual period 07/17/2012.There is no height or weight on file to calculate BMI.  General Appearance: Casual and Fairly Groomed  Eye Contact:  Good  Speech:  Clear and Coherent and Normal Rate  Volume:  Normal  Mood:  Dysphoric  Affect:  Congruent  Thought Process:  Coherent and Goal Directed  Orientation:  Full (Time, Place, and Person)  Thought Content: WDL and Logical   Suicidal Thoughts:  No  Homicidal Thoughts:  No  Memory:  Immediate;   Good Recent;   Good  Judgement:  Good  Insight:  Good  Psychomotor Activity:  Normal  Concentration:  Concentration: Good and Attention Span: Good  Recall:  Good  Fund of Knowledge: Good  Language: Good  Akathisia:  Negative  Handed:  Right  AIMS (if indicated): not done  Assets:  Communication Skills Desire for Improvement Housing Resilience Social Support  ADL's:  Intact  Cognition: WNL  Sleep:  Good   Screenings: AIMS    Flowsheet Row Admission (Discharged) from 01/18/2023 in [place] Total Score 0      AUDIT    [facility] Admission (Discharged) from 01/18/2023 in [place]  Alcohol Use Disorder Identification Test Final Score (AUDIT) 0      GAD-7    Flowsheet Row Counselor from 01/24/2023 in [place] Office Visit from 01/23/2023 in [place] Office Visit from 01/17/2023 in [place] Office Visit from 01/16/2023 in [place] at [place] Visit from 11/30/2022 in [place]  Total GAD-7 Score '13 9 13 14 14      '$ PHQ2-9    Flowsheet Row Counselor from 02/01/2023 in [place] Counselor from 01/24/2023 in [place] Office Visit from  01/23/2023 in [place] Office Visit from 01/17/2023 in [place] Office Visit from 01/16/2023 in [place] at  [place]  PHQ-2 Total Score '2 4 5 4 4  '$ PHQ-9 Total Score '8 15 16 17 18      '$ Flowsheet Row Counselor from 02/01/2023 in [place] Counselor from 01/24/2023 in [place] Office Visit from [facility]/23/2023 in [facility]  C-SSRS RISK CATEGORY Error: Q3, 4, or 5 should not be populated when Q2 is No Error: Q3, 4, or 5 should not be populated when Q2 is No High Risk        Assessment and Plan:  Katherine Steele is a 52 yr old female who presents via Virtual Video Visit for Follow Up and Medication Management, she enrolled in the [facility] on 01/29/2023.  PPHx is significant for Depression, GAD, and Gender Dysphoria, and 2 Suicide Attempts Via OD (last 01/2023) and 2 Psychiatric Hospitalizations (last- [facility] 01/2023), and no history of Self Injurious Behavior.     Katherine Steele has had improvement with the Abilify and all minor side effects are improving.  She will look into scheduling ADHD testing so that she can follow up with her outpatient provider.  We will not make any medication changes at this time.  We will continue to monitor.    MDD, Recurrent, Severe, w/out Psychosis  GAD: -Continue Effexor XR 117.5 mg daily.  No refills sent at this time. -Continue Abilify 2 mg daily for augmentation.  No refills sent at this time.   Collaboration of Care: Collaboration of Care: Other PHP Program  Patient/Guardian was advised Release of Information must be obtained prior to any record release in order to collaborate their care with an outside provider. Patient/Guardian was advised if they have not already done so to contact the registration department to sign all necessary forms in order  for [name] to release information regarding their care.   Consent: Patient/Guardian gives verbal consent for treatment and assignment of benefits for services provided during this visit. Patient/Guardian expressed understanding and agreed to proceed.    Briant Cedar, MD 02/12/2023, 3:39 PM  Follow Up Instructions:    I discussed the assessment and treatment plan with the patient. The patient was provided an opportunity to ask questions and all were answered. The patient agreed with the plan and demonstrated an understanding of the instructions.   The patient was advised to call back or seek an in-person evaluation if the symptoms worsen or if the condition fails to improve as anticipated.  I provided 20 minutes of non-face-to-face time during this encounter.   Briant Cedar, MD

## 2023-02-13 ENCOUNTER — Other Ambulatory Visit (HOSPITAL_COMMUNITY): Payer: BC Managed Care – PPO | Attending: Psychiatry | Admitting: Licensed Clinical Social Worker

## 2023-02-13 ENCOUNTER — Encounter (HOSPITAL_COMMUNITY): Payer: Self-pay | Admitting: Psychiatry

## 2023-02-13 ENCOUNTER — Other Ambulatory Visit (HOSPITAL_COMMUNITY): Payer: BC Managed Care – PPO | Attending: Psychiatry

## 2023-02-13 DIAGNOSIS — F332 Major depressive disorder, recurrent severe without psychotic features: Secondary | ICD-10-CM | POA: Diagnosis not present

## 2023-02-13 DIAGNOSIS — R4589 Other symptoms and signs involving emotional state: Secondary | ICD-10-CM | POA: Insufficient documentation

## 2023-02-13 DIAGNOSIS — F411 Generalized anxiety disorder: Secondary | ICD-10-CM | POA: Diagnosis not present

## 2023-02-14 ENCOUNTER — Other Ambulatory Visit (HOSPITAL_COMMUNITY): Payer: BC Managed Care – PPO | Attending: Psychiatry | Admitting: Licensed Clinical Social Worker

## 2023-02-14 ENCOUNTER — Encounter (HOSPITAL_COMMUNITY): Payer: Self-pay

## 2023-02-14 ENCOUNTER — Telehealth: Payer: Self-pay

## 2023-02-14 ENCOUNTER — Other Ambulatory Visit (HOSPITAL_COMMUNITY): Payer: BC Managed Care – PPO | Attending: Psychiatry

## 2023-02-14 DIAGNOSIS — Z9151 Personal history of suicidal behavior: Secondary | ICD-10-CM | POA: Diagnosis not present

## 2023-02-14 DIAGNOSIS — F332 Major depressive disorder, recurrent severe without psychotic features: Secondary | ICD-10-CM | POA: Diagnosis not present

## 2023-02-14 DIAGNOSIS — F640 Transsexualism: Secondary | ICD-10-CM | POA: Diagnosis not present

## 2023-02-14 DIAGNOSIS — Z79899 Other long term (current) drug therapy: Secondary | ICD-10-CM | POA: Insufficient documentation

## 2023-02-14 DIAGNOSIS — R4589 Other symptoms and signs involving emotional state: Secondary | ICD-10-CM

## 2023-02-14 DIAGNOSIS — F411 Generalized anxiety disorder: Secondary | ICD-10-CM | POA: Diagnosis not present

## 2023-02-14 MED ORDER — VENLAFAXINE HCL ER 37.5 MG PO CP24: 37.5000 mg | ORAL_CAPSULE | Freq: Every day | ORAL | 0 refills | Status: DC

## 2023-02-14 MED ORDER — VENLAFAXINE HCL ER 75 MG PO CP24: 75.0000 mg | ORAL_CAPSULE | Freq: Every day | ORAL | 0 refills | Status: DC

## 2023-02-14 NOTE — Therapy (Signed)
[place] [employer], [place], [address] Phone: [phone]   Fax:  [phone]  Occupational Therapy Treatment Virtual Visit via Video Note  I connected with Katherine Steele on 02/14/23 at  8:00 AM EST by a video enabled telemedicine application and verified that I am speaking with the correct person using two identifiers.  Location: Patient: home Provider: office   I discussed the limitations of evaluation and management by telemedicine and the availability of in person appointments. The patient expressed understanding and agreed to proceed.    The patient was advised to call back or seek an in-person evaluation if the symptoms worsen or if the condition fails to improve as anticipated.  I provided 55 minutes of non-face-to-face time during this encounter.   Patient Details  Name: Katherine Steele MRN: [id number] Date of Birth: [birth year] No data recorded  Encounter Date: 02/14/2023   OT End of Session - 02/14/23 1317     Visit Number 11    Number of Visits 20    Date for OT Re-Evaluation 03/02/23    OT Start Time 1200    OT Stop Time 1255    OT Time Calculation (min) 55 min             Past Medical History:  Diagnosis Date   BMI 33.0-33.9,adult    Endometriosis    s/p hysterectomy   GAD (generalized anxiety disorder)    Dr. Annitta Jersey psychiatrist   Gender dysphoria 2014   did undergo 10 mo testosterone treatment   GERD (gastroesophageal reflux disease)    HLD (hyperlipidemia) 08/12/2014   Mild off meds    MDD (major depressive disorder)    Dr. Annitta Jersey psychiatrist   PONV (postoperative nausea and vomiting)    after finger surgery    Past Surgical History:  Procedure Laterality Date   CESAREAN SECTION     COLONOSCOPY  2007   WNL [facility])   COLONOSCOPY WITH PROPOFOL N/A 03/31/2021   WNL, rpt 10 yrs Katherine Steele, [place] B, MD)   ESOPHAGOGASTRODUODENOSCOPY (EGD) WITH PROPOFOL N/A 03/21/2018    barrett's without dysplasia, mild chronic gastritis, reflux gastroesophagitis Katherine Steele, Katherine B, MD)   ESOPHAGOGASTRODUODENOSCOPY (EGD) WITH PROPOFOL N/A 03/31/2021   small HH, shatski ring, ?barretts - biopsies WNL Katherine Steele, Katherine Bihari, MD)   FINGER SURGERY Right 2007   little finger   OOPHORECTOMY Left 2006   TONSILLECTOMY  1975   TOTAL ABDOMINAL HYSTERECTOMY  12/2012   endometriosis and ovarian cysts s/p ovaries removed   TYMPANOSTOMY TUBE PLACEMENT Bilateral 1976    There were no vitals filed for this visit.   Subjective Assessment - 02/14/23 1317     Currently in Pain? No/denies    Pain Score 0-No pain                 Group Session:  S: Doing a bit better today actually.  O: The primary objective of this topic is to explore and understand the concept of occupational balance in the context of daily living. The term "occupational balance" is defined broadly, encompassing all activities that occupy an individual's time and energy, including self-care, leisure, and work-related tasks. The goal is to guide participants towards achieving a harmonious blend of these activities, tailored to their personal values and life circumstances. This balance is aimed at enhancing overall well-being, not by equally distributing time across activities, but by ensuring that daily engagements are fulfilling and not draining. The content delves into  identifying various barriers that individuals face in achieving occupational balance, such as overcommitment, misaligned priorities, external pressures, and lack of effective time management. The impact of these barriers on occupational performance, roles, and lifestyles is examined, highlighting issues like reduced efficiency, strained relationships, and potential health problems. Strategies for cultivating occupational balance are a key focus. These strategies include practical methods like time blocking, prioritizing tasks, establishing  self-care rituals, decluttering, connecting with nature, and engaging in reflective practices. These approaches are designed to be adaptable and applicable to a wide range of life scenarios, promoting a proactive and mindful approach to daily living. The overall aim is to equip participants with the knowledge and tools to create a balanced lifestyle that supports their mental, emotional, and physical health, thereby improving their functional performance in daily life.   A:  The patient demonstrated a high level of engagement and active participation throughout the session on occupational balance. The patient frequently contributed to discussions, offering insightful reflections on personal experiences related to the barriers and strategies for achieving occupational balance. There was a clear understanding of the concept and an ability to relate it to their own life. The patient showed enthusiasm in learning and applying the strategies discussed, such as time blocking and self-care rituals, indicating a strong motivation to improve their occupational balance. The patient's proactive approach and responsiveness to the topic suggest a high potential for implementing these strategies effectively in their daily routine.   P: Continue to attend PHP OT group sessions 5x week for 4 weeks to promote daily structure, social engagement, and opportunities to develop and utilize adaptive strategies to maximize functional performance in preparation for safe transition and integration back into school, work, and the community. Plan to address topic of tbd in next OT group session.                  OT Education - 02/14/23 1317     Education Details Occupational Balance              OT Short Term Goals - 01/31/23 1907       OT SHORT TERM GOAL #1   Title Client will develop and utilize a personalized coping toolbox containing at least five coping strategies to manage challenging situations,  demonstrating their use in real-life scenarios by the end of therapy.    Time 4    Period Weeks    Status On-going    Target Date 03/02/23      OT SHORT TERM GOAL #2   Title By discharge, client will demonstrate the ability to adapt and modify routines when faced with unexpected events or changes, maintaining a balanced approach to daily tasks.      OT SHORT TERM GOAL #3   Title By the time of discharge, client will independently set, track, and make progress towards a long-term goal, demonstrating resilience in overcoming obstacles and seeking support when needed.                      Plan - 02/14/23 1317     Psychosocial Skills Interpersonal Interaction;Routines and Behaviors;Habits;Coping Strategies             Patient will benefit from skilled therapeutic intervention in order to improve the following deficits and impairments:       Psychosocial Skills: Interpersonal Interaction, Routines and Behaviors, Habits, Coping Strategies   Visit Diagnosis: Difficulty coping    Problem List Patient Active Problem List   Diagnosis Date Noted  MDD (major depressive disorder), recurrent episode, severe ([place]) 01/24/2023   Gender dysphoria 11/30/2022   Long term current use of cannabis 11/30/2022   High risk medication use 11/30/2022   Anxiety attack 09/28/2022   Adult attention deficit disorder 02/14/2021   Obesity, Class I, BMI 30-34.9    Surgical menopause, asymptomatic 12/09/2018   Schatzki's ring    Barrett's esophagus    Gastroesophageal reflux disease    Esophageal dysphagia    Hiatal hernia    Acquired trigger finger 09/19/2016   HLD (hyperlipidemia) 08/12/2014   Health maintenance examination 05/04/2014   Gender dysphoria in adult 07/17/2012   Anxiety disorder 02/07/2012   MDD (major depressive disorder), recurrent episode, moderate ([place]) 02/07/2012    Brantley Stage, OT 02/14/2023, 1:18 PM  Cornell Barman, OT   [facility] HOSPITALIZATION PROGRAM [place], [place], [address] Phone: [phone]   Fax:  [phone]  Name: KAZUKO KRUPNICK MRN: [id number] Date of Birth: [birth year]

## 2023-02-14 NOTE — Psych (Signed)
Virtual Visit via Video Note  I connected with Katherine Steele on 02/11/23 at  9:00 AM EST by a video enabled telemedicine application and verified that I am speaking with the correct person using two identifiers.  Location: Patient: patient home Provider: clinical home office   I discussed the limitations of evaluation and management by telemedicine and the availability of in person appointments. The patient expressed understanding and agreed to proceed.  I discussed the assessment and treatment plan with the patient. The patient was provided an opportunity to ask questions and all were answered. The patient agreed with the plan and demonstrated an understanding of the instructions.   The patient was advised to call back or seek an in-person evaluation if the symptoms worsen or if the condition fails to improve as anticipated.  Pt was provided 240 minutes of non-face-to-face time during this encounter.   Lorin Glass, LCSW   [facility] PHP THERAPIST PROGRESS NOTE  Katherine Steele [id number]  Session Time: 9:00- 10:00  Participation Level: Active  Behavioral Response: CasualAlertDepressed  Type of Therapy: Group Therapy  Treatment Goals addressed: Coping  Progress Towards Goals: Progressing  Interventions: CBT, DBT, Supportive, and Reframing  Summary: Katherine Steele is a 52 y.o. female who presents with depression and anxiety symptoms.  Clinician led check-in regarding current stressors and situation, and review of patient completed daily inventory. Clinician utilized active listening and empathetic response and validated patient emotions. Clinician facilitated processing group on pertinent issues.?    Therapist Response: Patient arrived within time allowed. Patient rates her mood at a 7 on a scale of 1-10 with 10 being best. Pt states she feels "okay." Pt states she slept 7 hours and ate 3x. Pt reports her weekend was "low key" and she "listened to my body" and rested. Pt states  she feels her medications working and there is an increase in her ability to complete tasks, however she remains below baseline. Pt reports turmoil regarding her work situation and continues to struggle with thinking about it without ruminating, spiraling, and not being able to rebound for a few hours. Pt reports some conflict with her husband which upset her.  Patient able to process. Patient engaged in discussion.             Session Time: 10:00 am - 11:00 am   Participation Level: Active   Behavioral Response: CasualAlertDepressed   Type of Therapy: Group Therapy   Treatment Goals addressed: Coping   Progress Towards Goals: Progressing   Interventions: CBT, DBT, Solution Focused, Strength-based, Supportive, and Reframing   Therapist Response: Cln led discussion on "work arounds" or finding intermediate solutions while we are in the process of working on a final solution. Group members shared situations in which they are struggling with an issue that they are unable to fix quickly. Cln encouraged pt's to consider ways to make things "doable" and not let "perfect" stand in the way.    Therapist Response: Pt engaged in discussion and is able to connect with topic.           Session Time: 11:00 -12:00   Participation Level: Active   Behavioral Response: CasualAlertDepressed   Type of Therapy: Group Therapy   Treatment Goals addressed: Coping   Progress Towards Goals: Progressing   Interventions: CBT, DBT, Solution Focused, Strength-based, Supportive, and Reframing   Summary: Cln continued discussion on topic of boundaries. Group reviewed previous aspects of boundaries discussed. Cln utilized handout "Tips for [employer]" and group members  discussed how to apply the tips. Cln shaped conversation and cued for healthy boundary characteristics.    Therapist Response:  Pt engaged in discussion and is able to process ways to increase their healthy boundaries.              Session Time: 12:00 -1:00   Participation Level: Active   Behavioral Response: CasualAlertDepressed   Type of Therapy: Group therapy, Occupational Therapy   Treatment Goals addressed: Coping   Progress Towards Goals: Progressing   Interventions: Supportive; Psychoeducation   Summary: 12:00 - 12:50: Occupational Therapy group led by cln E. Hollan. 12:50 - 1:00 Clinician assessed for immediate needs, medication compliance and efficacy, and safety concerns.   Therapist Response: 12:00 - 12:50: See OT note 12:50 - 1:00 pm: At check-out, patient reports no immediate concerns. Patient demonstrates progress as evidenced by continued engagement in group and responsiveness to treatment. Patient denies SI/HI/self-harm thoughts at the end of group.    Suicidal/Homicidal: Nowithout intent/plan  Plan: Pt will continue in PHP while working to decrease depression and anxiety symptoms, decrease negative response to stressors, and increase ability to manage symptoms in a healthy manner.   Collaboration of Care: Medication Management AEB A Pashayan  Patient/Guardian was advised Release of Information must be obtained prior to any record release in order to collaborate their care with an outside provider. Patient/Guardian was advised if they have not already done so to contact the registration department to sign all necessary forms in order for [name] to release information regarding their care.   Consent: Patient/Guardian gives verbal consent for treatment and assignment of benefits for services provided during this visit. Patient/Guardian expressed understanding and agreed to proceed.   Diagnosis: Severe episode of recurrent major depressive disorder, without psychotic features ([place]) [F33.2]    1. Severe episode of recurrent major depressive disorder, without psychotic features ([place])   2. GAD (generalized anxiety disorder)       Lorin Glass, LCSW 02/14/2023

## 2023-02-14 NOTE — Telephone Encounter (Signed)
I have sent venlafaxine to pharmacy.

## 2023-02-14 NOTE — Therapy (Signed)
[place], [place], [address] Phone: [phone]   Fax:  [phone]  Occupational Therapy Treatment Virtual Visit via Video Note  I connected with [name] on 02/14/23 at  8:00 AM EST by a video enabled telemedicine application and verified that I am speaking with the correct person using two identifiers.  Location: Patient: home Provider: office   I discussed the limitations of evaluation and management by telemedicine and the availability of in person appointments. The patient expressed understanding and agreed to proceed.    The patient was advised to call back or seek an in-person evaluation if the symptoms worsen or if the condition fails to improve as anticipated.  I provided 55 minutes of non-face-to-face time during this encounter.   Patient Details  Name: Katherine Steele MRN: [id number] Date of Birth: [birth year] No data recorded  Encounter Date: 02/13/2023   OT End of Session - 02/14/23 0928     Visit Number 10    Number of Visits 20    Date for OT Re-Evaluation 03/02/23    OT Start Time 1200    OT Stop Time [id number]    OT Time Calculation (min) 55 min             Past Medical History:  Diagnosis Date   BMI 33.0-33.9,adult    Endometriosis    s/p hysterectomy   GAD (generalized anxiety disorder)    Dr. Annitta [place] psychiatrist   Gender dysphoria 2014   did undergo 10 mo testosterone treatment   GERD (gastroesophageal reflux disease)    HLD (hyperlipidemia) 08/12/2014   Mild off meds    MDD (major depressive disorder)    Dr. Annitta [place] psychiatrist   PONV (postoperative nausea and vomiting)    after finger surgery    Past Surgical History:  Procedure Laterality Date   CESAREAN SECTION     COLONOSCOPY  2007   WNL [facility])   COLONOSCOPY WITH PROPOFOL N/A 03/31/2021   WNL, rpt 10 yrs Bonna Gains, [place] B, MD)   ESOPHAGOGASTRODUODENOSCOPY (EGD) WITH PROPOFOL N/A 03/21/2018    barrett's without dysplasia, mild chronic gastritis, reflux gastroesophagitis Bonna Gains, [name], MD)   ESOPHAGOGASTRODUODENOSCOPY (EGD) WITH PROPOFOL N/A 03/31/2021   small HH, shatski ring, ?barretts - biopsies WNL Bonna Gains, Lennette Bihari, MD)   FINGER SURGERY Right 2007   little finger   OOPHORECTOMY Left 2006   TONSILLECTOMY  1975   TOTAL ABDOMINAL HYSTERECTOMY  12/2012   endometriosis and ovarian cysts s/p ovaries removed   TYMPANOSTOMY TUBE PLACEMENT Bilateral 1976    There were no vitals filed for this visit.   Subjective Assessment - 02/14/23 0928     Currently in Pain? No/denies    Pain Score 0-No pain               Group Session:  S: doing better today.  O: The primary objective of this topic is to explore and understand the concept of occupational balance in the context of daily living. The term "occupational balance" is defined broadly, encompassing all activities that occupy an individual's time and energy, including self-care, leisure, and work-related tasks. The goal is to guide participants towards achieving a harmonious blend of these activities, tailored to their personal values and life circumstances. This balance is aimed at enhancing overall well-being, not by equally distributing time across activities, but by ensuring that daily engagements are fulfilling and not draining. The content delves into identifying various barriers that individuals  face in achieving occupational balance, such as overcommitment, misaligned priorities, external pressures, and lack of effective time management. The impact of these barriers on occupational performance, roles, and lifestyles is examined, highlighting issues like reduced efficiency, strained relationships, and potential health problems. Strategies for cultivating occupational balance are a key focus. These strategies include practical methods like time blocking, prioritizing tasks, establishing self-care rituals,  decluttering, connecting with nature, and engaging in reflective practices. These approaches are designed to be adaptable and applicable to a wide range of life scenarios, promoting a proactive and mindful approach to daily living. The overall aim is to equip participants with the knowledge and tools to create a balanced lifestyle that supports their mental, emotional, and physical health, thereby improving their functional performance in daily life.   A:  The patient demonstrated a high level of engagement and active participation throughout the session on occupational balance. The patient frequently contributed to discussions, offering insightful reflections on personal experiences related to the barriers and strategies for achieving occupational balance. There was a clear understanding of the concept and an ability to relate it to their own life. The patient showed enthusiasm in learning and applying the strategies discussed, such as time blocking and self-care rituals, indicating a strong motivation to improve their occupational balance. The patient's proactive approach and responsiveness to the topic suggest a high potential for implementing these strategies effectively in their daily routine.   P: Continue to attend PHP OT group sessions 5x week for 4 weeks to promote daily structure, social engagement, and opportunities to develop and utilize adaptive strategies to maximize functional performance in preparation for safe transition and integration back into school, work, and the community. Plan to address topic of pt 2 in next OT group session.                    OT Education - 02/14/23 0928     Education Details Occupational Balance              OT Short Term Goals - 01/31/23 1907       OT SHORT TERM GOAL #1   Title Client will develop and utilize a personalized coping toolbox containing at least five coping strategies to manage challenging situations, demonstrating  their use in real-life scenarios by the end of therapy.    Time 4    Period Weeks    Status On-going    Target Date 03/02/23      OT SHORT TERM GOAL #2   Title By discharge, client will demonstrate the ability to adapt and modify routines when faced with unexpected events or changes, maintaining a balanced approach to daily tasks.      OT SHORT TERM GOAL #3   Title By the time of discharge, client will independently set, track, and make progress towards a long-term goal, demonstrating resilience in overcoming obstacles and seeking support when needed.                      Plan - 02/14/23 0929     Psychosocial Skills Interpersonal Interaction;Routines and Behaviors;Habits;Coping Strategies             Patient will benefit from skilled therapeutic intervention in order to improve the following deficits and impairments:       Psychosocial Skills: Interpersonal Interaction, Routines and Behaviors, Habits, Coping Strategies   Visit Diagnosis: Difficulty coping    Problem List Patient Active Problem List   Diagnosis Date Noted  MDD (major depressive disorder), recurrent episode, severe ([place]) 01/24/2023   Gender dysphoria 11/30/2022   Long term current use of cannabis 11/30/2022   High risk medication use 11/30/2022   Anxiety attack 09/28/2022   Adult attention deficit disorder 02/14/2021   Obesity, Class I, BMI 30-34.9    Surgical menopause, asymptomatic 12/09/2018   Schatzki's ring    Barrett's esophagus    Gastroesophageal reflux disease    Esophageal dysphagia    Hiatal hernia    Acquired trigger finger 09/19/2016   HLD (hyperlipidemia) 08/12/2014   Health maintenance examination 05/04/2014   Gender dysphoria in adult 07/17/2012   Anxiety disorder 02/07/2012   MDD (major depressive disorder), recurrent episode, moderate ([place]) 02/07/2012    Brantley Stage, OT 02/14/2023, 9:30 AM  [facility], [place], [place], [address] Phone: [phone]   Fax:  [phone]  Name: Katherine Steele MRN: [id number] Date of Birth: [birth year]

## 2023-02-14 NOTE — Therapy (Signed)
[place], [place], [address] Phone: [phone]   Fax:  [phone]  Occupational Therapy Treatment Virtual Visit via Video Note  I connected with Katherine Steele on 02/14/23 at  8:00 AM EST by a video enabled telemedicine application and verified that I am speaking with the correct person using two identifiers.  Location: Patient: home Provider: office   I discussed the limitations of evaluation and management by telemedicine and the availability of in person appointments. The patient expressed understanding and agreed to proceed.    The patient was advised to call back or seek an in-person evaluation if the symptoms worsen or if the condition fails to improve as anticipated.  I provided 55 minutes of non-face-to-face time during this encounter.   Patient Details  Name: Katherine Steele MRN: [id number] Date of Birth: [birth year] No data recorded  Encounter Date: 02/12/2023   OT End of Session - 02/14/23 0855     Visit Number 9    Number of Visits 20    Date for OT Re-Evaluation 03/02/23    OT Start Time 1200    OT Stop Time 1255    OT Time Calculation (min) 55 min             Past Medical History:  Diagnosis Date   BMI 33.0-33.9,adult    Endometriosis    s/p hysterectomy   GAD (generalized anxiety disorder)    Dr. Annitta [place] psychiatrist   Gender dysphoria 2014   did undergo 10 mo testosterone treatment   GERD (gastroesophageal reflux disease)    HLD (hyperlipidemia) 08/12/2014   Mild off meds    MDD (major depressive disorder)    Dr. Annitta [place] psychiatrist   PONV (postoperative nausea and vomiting)    after finger surgery    Past Surgical History:  Procedure Laterality Date   CESAREAN SECTION     COLONOSCOPY  2007   WNL [facility])   COLONOSCOPY WITH PROPOFOL N/A 03/31/2021   WNL, rpt 10 yrs Bonna Gains, [place] B, MD)   ESOPHAGOGASTRODUODENOSCOPY (EGD) WITH PROPOFOL N/A 03/21/2018    barrett's without dysplasia, mild chronic gastritis, reflux gastroesophagitis Bonna Gains, [name], MD)   ESOPHAGOGASTRODUODENOSCOPY (EGD) WITH PROPOFOL N/A 03/31/2021   small HH, shatski ring, ?barretts - biopsies WNL Bonna Gains, Lennette Bihari, MD)   FINGER SURGERY Right 2007   little finger   OOPHORECTOMY Left 2006   TONSILLECTOMY  1975   TOTAL ABDOMINAL HYSTERECTOMY  12/2012   endometriosis and ovarian cysts s/p ovaries removed   TYMPANOSTOMY TUBE PLACEMENT Bilateral 1976    There were no vitals filed for this visit.   Subjective Assessment - 02/14/23 0855     Currently in Pain? No/denies    Pain Score 0-No pain               Group Session:  S: Doing better today.   O: The primary objective of integrating mindfulness and meditation into the therapeutic process for this group of adults is to enhance their daily functioning and overall well-being, despite the challenges posed by depression, anxiety, and the distractions of modern life, including technology. This objective will be pursued through the following measurable and observable components: Mindfulness Practice: Track frequency, duration, and types of mindfulness activities. Concentration and Attention: Assess improvements in focus and present-moment engagement. Depression and Anxiety Symptoms: Measure changes using standardized scales and self-reports. Emotional Regulation: Observe advancements in managing emotional responses and stress. Physical Health and Sleep: Monitor changes in  health indicators and sleep quality related to mindfulness practice. Social Functioning: Document shifts in interpersonal relationships, communication, and empathy. These concise objectives aim to quantify the impact of mindfulness and meditation on enhancing life quality for participants, allowing for targeted adjustments to maximize therapeutic outcomes.   A: The patient has shown significant engagement in the group sessions, actively  participating in discussions, sharing personal experiences, and consistently practicing mindfulness and meditation exercises both within and outside of the therapy setting. This active involvement has correlated with noticeable improvements in their ability to manage symptoms of depression and anxiety, as evidenced by self-reports and observable changes in behavior. The patient demonstrates enhanced concentration and emotional regulation, actively applies coping strategies learned in sessions, and reports increased satisfaction with interpersonal relationships and daily functioning. The proactive approach to integrating mindfulness practices into their life suggests a positive trajectory in their mental health journey, with mindfulness serving as a beneficial tool for managing stress and emotional challenges.    P: Continue to attend PHP OT group sessions 5x week for 4 weeks to promote daily structure, social engagement, and opportunities to develop and utilize adaptive strategies to maximize functional performance in preparation for safe transition and integration back into school, work, and the community. Plan to address topic of tbd in next OT group session.                    OT Education - 02/14/23 0855     Education Details Mindfulness and Meditation Practices              OT Short Term Goals - 01/31/23 1907       OT SHORT TERM GOAL #1   Title Client will develop and utilize a personalized coping toolbox containing at least five coping strategies to manage challenging situations, demonstrating their use in real-life scenarios by the end of therapy.    Time 4    Period Weeks    Status On-going    Target Date 03/02/23      OT SHORT TERM GOAL #2   Title By discharge, client will demonstrate the ability to adapt and modify routines when faced with unexpected events or changes, maintaining a balanced approach to daily tasks.      OT SHORT TERM GOAL #3   Title By the  time of discharge, client will independently set, track, and make progress towards a long-term goal, demonstrating resilience in overcoming obstacles and seeking support when needed.                      Plan - 02/14/23 0856     Psychosocial Skills Interpersonal Interaction;Routines and Behaviors;Habits;Coping Strategies             Patient will benefit from skilled therapeutic intervention in order to improve the following deficits and impairments:       Psychosocial Skills: Interpersonal Interaction, Routines and Behaviors, Habits, Coping Strategies   Visit Diagnosis: Difficulty coping    Problem List Patient Active Problem List   Diagnosis Date Noted   MDD (major depressive disorder), recurrent episode, severe ([place]) 01/24/2023   Gender dysphoria 11/30/2022   Long term current use of cannabis 11/30/2022   High risk medication use 11/30/2022   Anxiety attack 09/28/2022   Adult attention deficit disorder 02/14/2021   Obesity, Class I, BMI 30-34.9    Surgical menopause, asymptomatic 12/09/2018   Schatzki's ring    Barrett's esophagus    Gastroesophageal reflux disease    Esophageal  dysphagia    Hiatal hernia    Acquired trigger finger 09/19/2016   HLD (hyperlipidemia) 08/12/2014   Health maintenance examination 05/04/2014   Gender dysphoria in adult 07/17/2012   Anxiety disorder 02/07/2012   MDD (major depressive disorder), recurrent episode, moderate ([place]) 02/07/2012    Brantley Stage, OT 02/14/2023, 8:57 AM Cornell Barman, OT  [facility] PROGRAM [place], [place], [address] Phone: [phone]   Fax:  [phone]  Name: Katherine Steele MRN: [id number] Date of Birth: 1971/03/30

## 2023-02-14 NOTE — Telephone Encounter (Signed)
rx for the venlafaxine er 75 mg did not go to the pharmacy. please send again.       Disp Refills Start End   venlafaxine XR (EFFEXOR XR) 75 MG 24 hr capsule 30 capsule 0 01/23/2023    Sig - Route: Take 1 capsule (75 mg total) by mouth daily with breakfast. - Oral   Class: No Print   Notes to Pharmacy: Take along with Effexor XR 37.5 mg -total of 112.5 mg daily

## 2023-02-15 ENCOUNTER — Other Ambulatory Visit (HOSPITAL_COMMUNITY): Payer: BC Managed Care – PPO | Attending: Psychiatry | Admitting: Licensed Clinical Social Worker

## 2023-02-15 ENCOUNTER — Other Ambulatory Visit (HOSPITAL_COMMUNITY): Payer: BC Managed Care – PPO | Attending: Psychiatry

## 2023-02-15 DIAGNOSIS — F332 Major depressive disorder, recurrent severe without psychotic features: Secondary | ICD-10-CM | POA: Diagnosis not present

## 2023-02-15 DIAGNOSIS — F331 Major depressive disorder, recurrent, moderate: Secondary | ICD-10-CM | POA: Diagnosis not present

## 2023-02-15 DIAGNOSIS — R4589 Other symptoms and signs involving emotional state: Secondary | ICD-10-CM | POA: Insufficient documentation

## 2023-02-15 DIAGNOSIS — F649 Gender identity disorder, unspecified: Secondary | ICD-10-CM | POA: Diagnosis not present

## 2023-02-15 DIAGNOSIS — F411 Generalized anxiety disorder: Secondary | ICD-10-CM

## 2023-02-15 NOTE — Psych (Signed)
Virtual Visit via Video Note  I connected with Katherine Steele on 02/12/23 at  9:00 AM EST by a video enabled telemedicine application and verified that I am speaking with the correct person using two identifiers.  Location: Patient: patient home Provider: clinical home office   I discussed the limitations of evaluation and management by telemedicine and the availability of in person appointments. The patient expressed understanding and agreed to proceed.  I discussed the assessment and treatment plan with the patient. The patient was provided an opportunity to ask questions and all were answered. The patient agreed with the plan and demonstrated an understanding of the instructions.   The patient was advised to call back or seek an in-person evaluation if the symptoms worsen or if the condition fails to improve as anticipated.  Pt was provided 240 minutes of non-face-to-face time during this encounter.   Lorin Glass, LCSW   [facility] BH PHP THERAPIST PROGRESS NOTE  Katherine Steele [id number]  Session Time: 9:00- 10:00  Participation Level: Active  Behavioral Response: CasualAlertDepressed  Type of Therapy: Group Therapy  Treatment Goals addressed: Coping  Progress Towards Goals: Progressing  Interventions: CBT, DBT, Supportive, and Reframing  Summary: Katherine Steele is a 52 y.o. female who presents with depression and anxiety symptoms.  Clinician led check-in regarding current stressors and situation, and review of patient completed daily inventory. Clinician utilized active listening and empathetic response and validated patient emotions. Clinician facilitated processing group on pertinent issues.?    Therapist Response: Patient arrived within time allowed. Patient rates her mood at a 5 on a scale of 1-10 with 10 being best. Pt states she feels "not too bad." Pt states she slept 6.5 hours and ate 3x. Pt reports she had a difficult conversation with her husband last night and  continuing to struggle with wanting people to be different than they are. Pt identifies struggling with personalization and should statement and is able to notice improvement with recognizing and challenging them. Patient able to process. Patient engaged in discussion.             Session Time: 10:00 am - 11:00 am   Participation Level: Active   Behavioral Response: CasualAlertDepressed   Type of Therapy: Group Therapy   Treatment Goals addressed: Coping   Progress Towards Goals: Progressing   Interventions: CBT, DBT, Solution Focused, Strength-based, Supportive, and Reframing   Therapist Response: Cln led discussion on staying present. Cln highlighted CBT distorted thoughts of catastrophizing and fortune telling and encouraged pt's to think about today and tomorrow and not past that to address those distortions. Group members shared struggles they have with looking into the future.    Therapist Response: Pt is able to process and discuss how to apply strategies discussed.            Session Time: 11:00 -12:00   Participation Level: Active   Behavioral Response: CasualAlertDepressed   Type of Therapy: Group Therapy   Treatment Goals addressed: Coping   Progress Towards Goals: Progressing   Interventions: CBT, DBT, Solution Focused, Strength-based, Supportive, and Reframing   Summary: Cln led discussion on feelings and the role they play for our lives. Cln provided DBT wise mind framework to discuss facts about feelings and how to utilize these facts to help contextualize the feelings which we experience.     Therapist Response: Pt engaged in discussion and reports understanding.             Session Time: 12:00 -1:00  Participation Level: Active   Behavioral Response: CasualAlertDepressed   Type of Therapy: Group therapy, Occupational Therapy   Treatment Goals addressed: Coping   Progress Towards Goals: Progressing   Interventions: Supportive;  Psychoeducation   Summary: 12:00 - 12:50: Occupational Therapy group led by cln E. Hollan. 12:50 - 1:00 Clinician assessed for immediate needs, medication compliance and efficacy, and safety concerns.   Therapist Response: 12:00 - 12:50: See OT note 12:50 - 1:00 pm: At check-out, patient reports no immediate concerns. Patient demonstrates progress as evidenced by continued engagement in group and responsiveness to treatment. Patient denies SI/HI/self-harm thoughts at the end of group.    Suicidal/Homicidal: Nowithout intent/plan  Plan: Pt will continue in PHP while working to decrease depression and anxiety symptoms, decrease negative response to stressors, and increase ability to manage symptoms in a healthy manner.   Collaboration of Care: Medication Management AEB A Pashayan  Patient/Guardian was advised Release of Information must be obtained prior to any record release in order to collaborate their care with an outside provider. Patient/Guardian was advised if they have not already done so to contact the registration department to sign all necessary forms in order for [name] to release information regarding their care.   Consent: Patient/Guardian gives verbal consent for treatment and assignment of benefits for services provided during this visit. Patient/Guardian expressed understanding and agreed to proceed.   Diagnosis: Severe episode of recurrent major depressive disorder, without psychotic features ([place]) [F33.2]    1. Severe episode of recurrent major depressive disorder, without psychotic features ([place])   2. GAD (generalized anxiety disorder)   3. Gender dysphoria in adult       [place], [place] 02/15/2023

## 2023-02-15 NOTE — Telephone Encounter (Signed)
left message that rx was sent to the pharmacy

## 2023-02-15 NOTE — Psych (Signed)
Virtual Visit via Video Note  I connected with Katherine Steele on 02/15/23 at  9:00 AM EST by a video enabled telemedicine application and verified that I am speaking with the correct person using two identifiers.  Location: Katherine Steele: Katherine Steele home Provider: clinical home office   I discussed the limitations of evaluation and management by telemedicine and the availability of in person appointments. The Katherine Steele expressed understanding and agreed to proceed.  I discussed the assessment and treatment plan with the Katherine Steele. The Katherine Steele was provided an opportunity to ask questions and all were answered. The Katherine Steele agreed with the plan and demonstrated an understanding of the instructions.   The Katherine Steele was advised to call back or seek an in-person evaluation if the symptoms worsen or if the condition fails to improve as anticipated.  Pt was provided 240 minutes of non-face-to-face time during this encounter.   Lorin Glass, LCSW   [facility] BH PHP THERAPIST PROGRESS NOTE  [name] [id number]  Session Time: 9:00- 10:00  Participation Level: Active  Behavioral Response: CasualAlertDepressed  Type of Therapy: Group Therapy  Treatment Goals addressed: Coping  Progress Towards Goals: Progressing  Interventions: CBT, DBT, Supportive, and Reframing  Summary: Katherine Steele is a 52 y.o. female who presents with depression and anxiety symptoms.  Clinician led check-in regarding current stressors and situation, and review of Katherine Steele completed daily inventory. Clinician utilized active listening and empathetic response and validated Katherine Steele emotions. Clinician facilitated processing group on pertinent issues.?    Therapist Response: Katherine Steele arrived within time allowed. Katherine Steele rates her mood at a 7 on a scale of 1-10 with 10 being best. Pt states she feels "pretty good." Pt states she slept 7 hours and ate 3x. Pt reports she had an event with her husband and son happen in which pt reacted  differently than she typically would. Pt states she was able to resist personalizing and black and white thinking to react more clearly. Pt states feeling pleased with herself and the results. Pt continues to struggle with processing her coworker problem. Katherine Steele able to process. Katherine Steele engaged in discussion.             Session Time: 10:00 am - 11:00 am   Participation Level: Active   Behavioral Response: CasualAlertDepressed   Type of Therapy: Group Therapy   Treatment Goals addressed: Coping   Progress Towards Goals: Progressing   Interventions: CBT, DBT, Solution Focused, Strength-based, Supportive, and Reframing   Therapist Response: Cln led discussion on protecting our energy. Cln brought in topics of boundaries and self-esteem. Group discussed what drains them and why they engage in those activities. Cln encouraged pt's to consider what will protect their energy when making decisions.    Therapist Response: Pt engaged in discussion and reports gaining insight.           Session Time: 11:00 -12:00   Participation Level: Active   Behavioral Response: CasualAlertDepressed   Type of Therapy: Group Therapy   Treatment Goals addressed: Coping   Progress Towards Goals: Progressing   Interventions: CBT, DBT, Solution Focused, Strength-based, Supportive, and Reframing   Summary: Cln led discussion on thought fallacies, utilizing handout "Eleven Beliefs that Make Life Better." Group reviewed handout and discussed the struggles with believing these beliefs and how to reinforce them in our lives.    Therapist Response: Pt engaged in discussion and identifies struggling most with recognizing people don't have to like her.            Session  Time: 12:00 -1:00   Participation Level: Active   Behavioral Response: CasualAlertDepressed   Type of Therapy: Group therapy, Occupational Therapy   Treatment Goals addressed: Coping   Progress Towards Goals: Progressing    Interventions: Supportive; Psychoeducation   Summary: 12:00 - 12:50: Occupational Therapy group led by cln E. Hollan. 12:50 - 1:00 Clinician assessed for immediate needs, medication compliance and efficacy, and safety concerns.   Therapist Response: 12:00 - 12:50: See OT note 12:50 - 1:00 pm: At check-out, Katherine Steele reports no immediate concerns. Katherine Steele demonstrates progress as evidenced by continued engagement in group and responsiveness to treatment. Katherine Steele denies SI/HI/self-harm thoughts at the end of group.    Suicidal/Homicidal: Nowithout intent/plan  Plan: Pt will continue in PHP while working to decrease depression and anxiety symptoms, decrease negative response to stressors, and increase ability to manage symptoms in a healthy manner.   Collaboration of Care: Medication Management AEB A Pashayan  Katherine Steele/Guardian was advised Release of Information must be obtained prior to any record release in order to collaborate their care with an outside provider. Katherine Steele/Guardian was advised if they have not already done so to contact the registration department to sign all necessary forms in order for [name] to release information regarding their care.   Consent: Katherine Steele/Guardian gives verbal consent for treatment and assignment of benefits for services provided during this visit. Katherine Steele/Guardian expressed understanding and agreed to proceed.   Diagnosis: Severe episode of recurrent major depressive disorder, without psychotic features ([place]) [F33.2]    1. Severe episode of recurrent major depressive disorder, without psychotic features ([place])   2. GAD (generalized anxiety disorder)       Lorin Glass, LCSW 02/15/2023

## 2023-02-15 NOTE — Psych (Signed)
Virtual Visit via Video Note  I connected with Katherine Steele on 02/13/23 at  9:00 AM EST by a video enabled telemedicine application and verified that I am speaking with the correct person using two identifiers.  Location: Patient: patient home Provider: clinical home office   I discussed the limitations of evaluation and management by telemedicine and the availability of in person appointments. The patient expressed understanding and agreed to proceed.  I discussed the assessment and treatment plan with the patient. The patient was provided an opportunity to ask questions and all were answered. The patient agreed with the plan and demonstrated an understanding of the instructions.   The patient was advised to call back or seek an in-person evaluation if the symptoms worsen or if the condition fails to improve as anticipated.  Pt was provided 240 minutes of non-face-to-face time during this encounter.   Lorin Glass, LCSW   [facility] BH PHP THERAPIST PROGRESS NOTE  KIP MCINERNY [id number]  Session Time: 9:00- 10:00  Participation Level: Active  Behavioral Response: CasualAlertDepressed  Type of Therapy: Group Therapy  Treatment Goals addressed: Coping  Progress Towards Goals: Progressing  Interventions: CBT, DBT, Supportive, and Reframing  Summary: Katherine Steele is a 52 y.o. female who presents with depression and anxiety symptoms.  Clinician led check-in regarding current stressors and situation, and review of patient completed daily inventory. Clinician utilized active listening and empathetic response and validated patient emotions. Clinician facilitated processing group on pertinent issues.?    Therapist Response: Patient arrived within time allowed. Patient rates her mood at a 4 on a scale of 1-10 with 10 being best. Pt states she feels "not great." Pt states she slept 5 hours and ate 3x. Pt reports she has been spiraling re: her son and feeling his struggles are her  fault. Pt is able to identify with guidance that being on social media led to a comparison spiral which triggered her. Pt was open to challenging her negative thoughts and accepted challenges from cln and group. Pt reports struggling with self-esteem. Patient able to process. Patient engaged in discussion.             Session Time: 10:00 am - 11:00 am   Participation Level: Active   Behavioral Response: CasualAlertDepressed   Type of Therapy: Group Therapy   Treatment Goals addressed: Coping   Progress Towards Goals: Progressing   Interventions: CBT, DBT, Solution Focused, Strength-based, Supportive, and Reframing   Therapist Response: Cln led processing group for pt's current struggles. Group members shared stressors and provided support and feedback. Cln brought in topics of boundaries, healthy relationships, and unhealthy thought processes to inform discussion.    Therapist Response: Pt able to process and provide support to group.          Session Time: 11:00 -12:00   Participation Level: Active   Behavioral Response: CasualAlertDepressed   Type of Therapy: Group Therapy, Spiritual Care   Treatment Goals addressed: Coping   Progress Towards Goals: Progressing   Interventions: Supportive, Education   Summary:  [name], Chaplain, led group.   Therapist Response: Pt participated           Session Time: 12:00 -1:00   Participation Level: Active   Behavioral Response: CasualAlertDepressed   Type of Therapy: Group therapy, Occupational Therapy   Treatment Goals addressed: Coping   Progress Towards Goals: Progressing   Interventions: Supportive; Psychoeducation   Summary: 12:00 - 12:50: Occupational Therapy group led by cln E. Hollan. 12:50 -  1:00 Clinician assessed for immediate needs, medication compliance and efficacy, and safety concerns.   Therapist Response: 12:00 - 12:50: See OT note 12:50 - 1:00 pm: At check-out, patient reports no immediate  concerns. Patient demonstrates progress as evidenced by continued engagement in group and responsiveness to treatment. Patient denies SI/HI/self-harm thoughts at the end of group.    Suicidal/Homicidal: Nowithout intent/plan  Plan: Pt will continue in PHP while working to decrease depression and anxiety symptoms, decrease negative response to stressors, and increase ability to manage symptoms in a healthy manner.   Collaboration of Care: Medication Management AEB A Pashayan  Patient/Guardian was advised Release of Information must be obtained prior to any record release in order to collaborate their care with an outside provider. Patient/Guardian was advised if they have not already done so to contact the registration department to sign all necessary forms in order for [name] to release information regarding their care.   Consent: Patient/Guardian gives verbal consent for treatment and assignment of benefits for services provided during this visit. Patient/Guardian expressed understanding and agreed to proceed.   Diagnosis: Severe episode of recurrent major depressive disorder, without psychotic features ([place]) [F33.2]    1. Severe episode of recurrent major depressive disorder, without psychotic features ([place])   2. GAD (generalized anxiety disorder)       Lorin Glass, LCSW 02/15/2023

## 2023-02-15 NOTE — Psych (Signed)
Virtual Visit via Video Note  I connected with Katherine Steele on 02/14/23 at  9:00 AM EST by a video enabled telemedicine application and verified that I am speaking with the correct person using two identifiers.  Location: Patient: patient home Provider: clinical home office   I discussed the limitations of evaluation and management by telemedicine and the availability of in person appointments. The patient expressed understanding and agreed to proceed.  I discussed the assessment and treatment plan with the patient. The patient was provided an opportunity to ask questions and all were answered. The patient agreed with the plan and demonstrated an understanding of the instructions.   The patient was advised to call back or seek an in-person evaluation if the symptoms worsen or if the condition fails to improve as anticipated.  Pt was provided 240 minutes of non-face-to-face time during this encounter.   Lorin Glass, LCSW   [facility] BH PHP THERAPIST PROGRESS NOTE  Katherine Steele [id number]  Session Time: 9:00- 10:00  Participation Level: Active  Behavioral Response: CasualAlertDepressed  Type of Therapy: Group Therapy  Treatment Goals addressed: Coping  Progress Towards Goals: Progressing  Interventions: CBT, DBT, Supportive, and Reframing  Summary: Katherine Steele is a 52 y.o. female who presents with depression and anxiety symptoms.  Clinician led check-in regarding current stressors and situation, and review of patient completed daily inventory. Clinician utilized active listening and empathetic response and validated patient emotions. Clinician facilitated processing group on pertinent issues.?    Therapist Response: Patient arrived within time allowed. Patient rates her mood at a 7 on a scale of 1-10 with 10 being best. Pt states she feels "pretty good." Pt states she slept 6 hours and ate 3x. Pt reports topics discussed in group are "sticking" and she is able to be  self-directed more often. Pt reports calling her manager about returning to work which spiked anxiety and made her spiral. Pt states later using distraction to try to rebound her mood. Patient able to process. Patient engaged in discussion.             Session Time: 10:00 am - 11:00 am   Participation Level: Active   Behavioral Response: CasualAlertDepressed   Type of Therapy: Group Therapy   Treatment Goals addressed: Coping   Progress Towards Goals: Progressing   Interventions: CBT, DBT, Solution Focused, Strength-based, Supportive, and Reframing   Therapist Response: Cln led discussion on negative self-talk and how it affects [name]. Cln utilized CBT to discuss how thoughts shape our feelings and actions. Group members shared how negative thinking affects them and worked to reframe their negative thinking.    Therapist Response: Pt engaged in discussion and reports understanding.            Session Time: 11:00 -12:00   Participation Level: Active   Behavioral Response: CasualAlertDepressed   Type of Therapy: Group Therapy   Treatment Goals addressed: Coping   Progress Towards Goals: Progressing   Interventions: CBT, DBT, Solution Focused, Strength-based, Supportive, and Reframing   Summary: Cln continued discussion on feelings and how to effecitvely contextualize and work on them in our lives. Cln utilized handout "Myths about Emotions" to stimulate conversation on barriers to thinking about feelings in a healthy way. Group worked together to challenge the emotion myths.    Therapist Response: Pt engaged in discussion and demonstrates understanding through practice.           Session Time: 12:00 -1:00   Participation Level: Active   Behavioral  Response: CasualAlertDepressed   Type of Therapy: Group therapy, Occupational Therapy   Treatment Goals addressed: Coping   Progress Towards Goals: Progressing   Interventions: Supportive; Psychoeducation    Summary: 12:00 - 12:50: Occupational Therapy group led by cln E. Hollan. 12:50 - 1:00 Clinician assessed for immediate needs, medication compliance and efficacy, and safety concerns.   Therapist Response: 12:00 - 12:50: See OT note 12:50 - 1:00 pm: At check-out, patient reports no immediate concerns. Patient demonstrates progress as evidenced by continued engagement in group and responsiveness to treatment. Patient denies SI/HI/self-harm thoughts at the end of group.    Suicidal/Homicidal: Nowithout intent/plan  Plan: Pt will continue in PHP while working to decrease depression and anxiety symptoms, decrease negative response to stressors, and increase ability to manage symptoms in a healthy manner.   Collaboration of Care: Medication Management AEB A Pashayan  Patient/Guardian was advised Release of Information must be obtained prior to any record release in order to collaborate their care with an outside provider. Patient/Guardian was advised if they have not already done so to contact the registration department to sign all necessary forms in order for [name] to release information regarding their care.   Consent: Patient/Guardian gives verbal consent for treatment and assignment of benefits for services provided during this visit. Patient/Guardian expressed understanding and agreed to proceed.   Diagnosis: Severe episode of recurrent major depressive disorder, without psychotic features ([place]) [F33.2]    1. Severe episode of recurrent major depressive disorder, without psychotic features ([place])   2. GAD (generalized anxiety disorder)       Lorin Glass, LCSW 02/15/2023

## 2023-02-18 ENCOUNTER — Other Ambulatory Visit (HOSPITAL_COMMUNITY): Payer: BC Managed Care – PPO | Attending: Psychiatry

## 2023-02-18 ENCOUNTER — Other Ambulatory Visit (HOSPITAL_COMMUNITY): Payer: BC Managed Care – PPO | Attending: Psychiatry | Admitting: Licensed Clinical Social Worker

## 2023-02-18 ENCOUNTER — Encounter (HOSPITAL_COMMUNITY): Payer: Self-pay

## 2023-02-18 DIAGNOSIS — F332 Major depressive disorder, recurrent severe without psychotic features: Secondary | ICD-10-CM | POA: Diagnosis not present

## 2023-02-18 DIAGNOSIS — Z79899 Other long term (current) drug therapy: Secondary | ICD-10-CM | POA: Diagnosis not present

## 2023-02-18 DIAGNOSIS — F640 Transsexualism: Secondary | ICD-10-CM | POA: Insufficient documentation

## 2023-02-18 DIAGNOSIS — R4589 Other symptoms and signs involving emotional state: Secondary | ICD-10-CM | POA: Insufficient documentation

## 2023-02-18 DIAGNOSIS — F411 Generalized anxiety disorder: Secondary | ICD-10-CM

## 2023-02-18 DIAGNOSIS — F419 Anxiety disorder, unspecified: Secondary | ICD-10-CM | POA: Insufficient documentation

## 2023-02-18 DIAGNOSIS — Z9151 Personal history of suicidal behavior: Secondary | ICD-10-CM | POA: Diagnosis not present

## 2023-02-18 DIAGNOSIS — F329 Major depressive disorder, single episode, unspecified: Secondary | ICD-10-CM | POA: Diagnosis not present

## 2023-02-18 NOTE — Therapy (Signed)
Katherine Steele [place], [place], [address] Phone: [phone]   Fax:  [phone]  Occupational Therapy Treatment Virtual Visit via Video Note  I connected with Katherine Steele on 02/18/23 at  8:00 AM EST by a video enabled telemedicine application and verified that I am speaking with the correct person using two identifiers.  Location: Patient: home Provider: office   I discussed the limitations of evaluation and management by telemedicine and the availability of in person appointments. The patient expressed understanding and agreed to proceed.    The patient was advised to call back or seek an in-person evaluation if the symptoms worsen or if the condition fails to improve as anticipated.  I provided 55 minutes of non-face-to-face time during this encounter.   Patient Details  Name: Katherine Steele MRN: [id number] Date of Birth: 04-Feb-1971 No data recorded  Encounter Date: 02/18/2023   OT End of Session - 02/18/23 1657     Visit Number 13    Number of Visits 20    Date for OT Re-Evaluation 03/02/23    OT Start Time 1200    OT Stop Time 1255    OT Time Calculation (min) 55 min             Past Medical History:  Diagnosis Date   BMI 33.0-33.9,adult    Endometriosis    s/p hysterectomy   GAD (generalized anxiety disorder)    Dr. Annitta [place] psychiatrist   Gender dysphoria 2014   did undergo 10 mo testosterone treatment   GERD (gastroesophageal reflux disease)    HLD (hyperlipidemia) 08/12/2014   Mild off meds    MDD (major depressive disorder)    Dr. Annitta Jersey psychiatrist   PONV (postoperative nausea and vomiting)    after finger surgery    Past Surgical History:  Procedure Laterality Date   CESAREAN SECTION     COLONOSCOPY  2007   WNL [facility])   COLONOSCOPY WITH PROPOFOL N/A 03/31/2021   WNL, rpt 10 yrs Bonna Gains, [place] B, MD)   ESOPHAGOGASTRODUODENOSCOPY (EGD) WITH PROPOFOL N/A 03/21/2018    barrett's without dysplasia, mild chronic gastritis, reflux gastroesophagitis Bonna Gains, [name], MD)   ESOPHAGOGASTRODUODENOSCOPY (EGD) WITH PROPOFOL N/A 03/31/2021   small HH, shatski ring, ?barretts - biopsies WNL Bonna Gains, Lennette Bihari, MD)   FINGER SURGERY Right 2007   little finger   OOPHORECTOMY Left 2006   TONSILLECTOMY  1975   TOTAL ABDOMINAL HYSTERECTOMY  12/2012   endometriosis and ovarian cysts s/p ovaries removed   TYMPANOSTOMY TUBE PLACEMENT Bilateral 1976    There were no vitals filed for this visit.   Subjective Assessment - 02/18/23 1657     Currently in Pain? No/denies    Pain Score 0-No pain               Group Session:  S: Doing well today.   O: The primary objective of this topic is to explore and understand the concept of occupational balance in the context of daily living. The term "occupational balance" is defined broadly, encompassing all activities that occupy an individual's time and energy, including self-care, leisure, and work-related tasks. The goal is to guide participants towards achieving a harmonious blend of these activities, tailored to their personal values and life circumstances. This balance is aimed at enhancing overall well-being, not by equally distributing time across activities, but by ensuring that daily engagements are fulfilling and not draining. The content delves into identifying various barriers that  individuals face in achieving occupational balance, such as overcommitment, misaligned priorities, external pressures, and lack of effective time management. The impact of these barriers on occupational performance, roles, and lifestyles is examined, highlighting issues like reduced efficiency, strained relationships, and potential health problems. Strategies for cultivating occupational balance are a key focus. These strategies include practical methods like time blocking, prioritizing tasks, establishing self-care rituals,  decluttering, connecting with nature, and engaging in reflective practices. These approaches are designed to be adaptable and applicable to a wide range of life scenarios, promoting a proactive and mindful approach to daily living. The overall aim is to equip participants with the knowledge and tools to create a balanced lifestyle that supports their mental, emotional, and physical health, thereby improving their functional performance in daily life.   A:  The patient demonstrated a high level of engagement and active participation throughout the session on occupational balance. The patient frequently contributed to discussions, offering insightful reflections on personal experiences related to the barriers and strategies for achieving occupational balance. There was a clear understanding of the concept and an ability to relate it to their own life. The patient showed enthusiasm in learning and applying the strategies discussed, such as time blocking and self-care rituals, indicating a strong motivation to improve their occupational balance. The patient's proactive approach and responsiveness to the topic suggest a high potential for implementing these strategies effectively in their daily routine.    P: Continue to attend PHP OT group sessions 5x week for 4 weeks to promote daily structure, social engagement, and opportunities to develop and utilize adaptive strategies to maximize functional performance in preparation for safe transition and integration back into school, work, and the community. Plan to address topic of tbd in next OT group session.                    OT Education - 02/18/23 1657     Education Details Occupational Balance              OT Short Term Goals - 01/31/23 1907       OT SHORT TERM GOAL #1   Title Client will develop and utilize a personalized coping toolbox containing at least five coping strategies to manage challenging situations, demonstrating  their use in real-life scenarios by the end of therapy.    Time 4    Period Weeks    Status On-going    Target Date 03/02/23      OT SHORT TERM GOAL #2   Title By discharge, client will demonstrate the ability to adapt and modify routines when faced with unexpected events or changes, maintaining a balanced approach to daily tasks.      OT SHORT TERM GOAL #3   Title By the time of discharge, client will independently set, track, and make progress towards a long-term goal, demonstrating resilience in overcoming obstacles and seeking support when needed.                      Plan - 02/18/23 1658     Psychosocial Skills Interpersonal Interaction;Routines and Behaviors;Habits;Coping Strategies             Patient will benefit from skilled therapeutic intervention in order to improve the following deficits and impairments:       Psychosocial Skills: Interpersonal Interaction, Routines and Behaviors, Habits, Coping Strategies   Visit Diagnosis: Difficulty coping    Problem List Patient Active Problem List   Diagnosis Date Noted  MDD (major depressive disorder), recurrent episode, severe ([place]) 01/24/2023   Gender dysphoria 11/30/2022   Long term current use of cannabis 11/30/2022   High risk medication use 11/30/2022   Anxiety attack 09/28/2022   Adult attention deficit disorder 02/14/2021   Obesity, Class I, BMI 30-34.9    Surgical menopause, asymptomatic 12/09/2018   Schatzki's ring    Barrett's esophagus    Gastroesophageal reflux disease    Esophageal dysphagia    Hiatal hernia    Acquired trigger finger 09/19/2016   HLD (hyperlipidemia) 08/12/2014   Health maintenance examination 05/04/2014   Gender dysphoria in adult 07/17/2012   Anxiety disorder 02/07/2012   MDD (major depressive disorder), recurrent episode, moderate ([place]) 02/07/2012    Brantley Stage, OT 02/18/2023, 4:58 PM  Cornell Barman, OT   [facility]  HOSPITALIZATION PROGRAM [place], [place], [address] Phone: [phone]   Fax:  [phone]  Name: Katherine Steele MRN: [id number] Date of Birth: 07-26-1971

## 2023-02-18 NOTE — Therapy (Signed)
[name], [place], [address] Phone: [phone]   Fax:  [phone]  Occupational Therapy Treatment Virtual Visit via Video Note  I connected with Katherine Steele on 02/18/23 at  8:00 AM EST by a video enabled telemedicine application and verified that I am speaking with the correct person using two identifiers.  Location: Patient: home Provider: office   I discussed the limitations of evaluation and management by telemedicine and the availability of in person appointments. The patient expressed understanding and agreed to proceed.    The patient was advised to call back or seek an in-person evaluation if the symptoms worsen or if the condition fails to improve as anticipated.  I provided 55 minutes of non-face-to-face time during this encounter.   Patient Details  Name: Katherine Steele MRN: [id number] Date of Birth: 06-03-1971 No data recorded  Encounter Date: 02/15/2023   OT End of Session - 02/18/23 0709     Visit Number 12    Number of Visits 20    Date for OT Re-Evaluation 03/02/23    OT Start Time 1200    OT Stop Time [id number]    OT Time Calculation (min) 55 min             Past Medical History:  Diagnosis Date   BMI 33.0-33.9,adult    Endometriosis    s/p hysterectomy   GAD (generalized anxiety disorder)    Dr. Annitta Jersey psychiatrist   Gender dysphoria 2014   did undergo 10 mo testosterone treatment   GERD (gastroesophageal reflux disease)    HLD (hyperlipidemia) 08/12/2014   Mild off meds    MDD (major depressive disorder)    Dr. Annitta Jersey psychiatrist   PONV (postoperative nausea and vomiting)    after finger surgery    Past Surgical History:  Procedure Laterality Date   CESAREAN SECTION     COLONOSCOPY  2007   WNL [facility])   COLONOSCOPY WITH PROPOFOL N/A 03/31/2021   WNL, rpt 10 yrs Bonna Gains, [place] B, MD)   ESOPHAGOGASTRODUODENOSCOPY (EGD) WITH PROPOFOL N/A 03/21/2018    barrett's without dysplasia, mild chronic gastritis, reflux gastroesophagitis Bonna Gains, [name], MD)   ESOPHAGOGASTRODUODENOSCOPY (EGD) WITH PROPOFOL N/A 03/31/2021   small HH, shatski ring, ?barretts - biopsies WNL Bonna Gains, Lennette Bihari, MD)   FINGER SURGERY Right 2007   little finger   OOPHORECTOMY Left 2006   TONSILLECTOMY  1975   TOTAL ABDOMINAL HYSTERECTOMY  12/2012   endometriosis and ovarian cysts s/p ovaries removed   TYMPANOSTOMY TUBE PLACEMENT Bilateral 1976    There were no vitals filed for this visit.   Subjective Assessment - 02/18/23 0708     Currently in Pain? No/denies    Pain Score 0-No pain                 Group Session:  S: Doing better today.   O: The primary objective of this topic is to explore and understand the concept of occupational balance in the context of daily living. The term "occupational balance" is defined broadly, encompassing all activities that occupy an individual's time and energy, including self-care, leisure, and work-related tasks. The goal is to guide participants towards achieving a harmonious blend of these activities, tailored to their personal values and life circumstances. This balance is aimed at enhancing overall well-being, not by equally distributing time across activities, but by ensuring that daily engagements are fulfilling and not draining. The content delves into identifying various  barriers that individuals face in achieving occupational balance, such as overcommitment, misaligned priorities, external pressures, and lack of effective time management. The impact of these barriers on occupational performance, roles, and lifestyles is examined, highlighting issues like reduced efficiency, strained relationships, and potential health problems. Strategies for cultivating occupational balance are a key focus. These strategies include practical methods like time blocking, prioritizing tasks, establishing self-care rituals,  decluttering, connecting with nature, and engaging in reflective practices. These approaches are designed to be adaptable and applicable to a wide range of life scenarios, promoting a proactive and mindful approach to daily living. The overall aim is to equip participants with the knowledge and tools to create a balanced lifestyle that supports their mental, emotional, and physical health, thereby improving their functional performance in daily life.   A:  The patient demonstrated a high level of engagement and active participation throughout the session on occupational balance. The patient frequently contributed to discussions, offering insightful reflections on personal experiences related to the barriers and strategies for achieving occupational balance. There was a clear understanding of the concept and an ability to relate it to their own life. The patient showed enthusiasm in learning and applying the strategies discussed, such as time blocking and self-care rituals, indicating a strong motivation to improve their occupational balance. The patient's proactive approach and responsiveness to the topic suggest a high potential for implementing these strategies effectively in their daily routine.    P: Continue to attend PHP OT group sessions 5x week for 4 weeks to promote daily structure, social engagement, and opportunities to develop and utilize adaptive strategies to maximize functional performance in preparation for safe transition and integration back into school, work, and the community. Plan to address topic of tbd in next OT group session.                  OT Education - 02/18/23 0708     Education Details Occupational Balance              OT Short Term Goals - 01/31/23 1907       OT SHORT TERM GOAL #1   Title Client will develop and utilize a personalized coping toolbox containing at least five coping strategies to manage challenging situations, demonstrating their  use in real-life scenarios by the end of therapy.    Time 4    Period Weeks    Status On-going    Target Date 03/02/23      OT SHORT TERM GOAL #2   Title By discharge, client will demonstrate the ability to adapt and modify routines when faced with unexpected events or changes, maintaining a balanced approach to daily tasks.      OT SHORT TERM GOAL #3   Title By the time of discharge, client will independently set, track, and make progress towards a long-term goal, demonstrating resilience in overcoming obstacles and seeking support when needed.                      Plan - 02/18/23 0709     Psychosocial Skills Interpersonal Interaction;Routines and Behaviors;Habits;Coping Strategies             Patient will benefit from skilled therapeutic intervention in order to improve the following deficits and impairments:       Psychosocial Skills: Interpersonal Interaction, Routines and Behaviors, Habits, Coping Strategies   Visit Diagnosis: Difficulty coping    Problem List Patient Active Problem List   Diagnosis Date Noted  MDD (major depressive disorder), recurrent episode, severe ([place]) 01/24/2023   Gender dysphoria 11/30/2022   Long term current use of cannabis 11/30/2022   High risk medication use 11/30/2022   Anxiety attack 09/28/2022   Adult attention deficit disorder 02/14/2021   Obesity, Class I, BMI 30-34.9    Surgical menopause, asymptomatic 12/09/2018   Schatzki's ring    Barrett's esophagus    Gastroesophageal reflux disease    Esophageal dysphagia    Hiatal hernia    Acquired trigger finger 09/19/2016   HLD (hyperlipidemia) 08/12/2014   Health maintenance examination 05/04/2014   Gender dysphoria in adult 07/17/2012   Anxiety disorder 02/07/2012   MDD (major depressive disorder), recurrent episode, moderate ([place]) 02/07/2012    Brantley Stage, OT 02/18/2023, 7:10 AM  Cornell Barman, [place], [place], [address] Phone: [phone]   Fax:  [phone]  Name: BRIAHNA MAHDAVI MRN: [id number] Date of Birth: 07-13-1971

## 2023-02-19 ENCOUNTER — Other Ambulatory Visit (HOSPITAL_COMMUNITY): Payer: BC Managed Care – PPO | Attending: Psychiatry | Admitting: Licensed Clinical Social Worker

## 2023-02-19 ENCOUNTER — Encounter (HOSPITAL_COMMUNITY): Payer: Self-pay

## 2023-02-19 ENCOUNTER — Telehealth (HOSPITAL_COMMUNITY): Payer: Self-pay | Admitting: Psychiatry

## 2023-02-19 ENCOUNTER — Other Ambulatory Visit (HOSPITAL_COMMUNITY): Payer: BC Managed Care – PPO | Attending: Psychiatry

## 2023-02-19 DIAGNOSIS — F331 Major depressive disorder, recurrent, moderate: Secondary | ICD-10-CM | POA: Insufficient documentation

## 2023-02-19 DIAGNOSIS — F332 Major depressive disorder, recurrent severe without psychotic features: Secondary | ICD-10-CM | POA: Insufficient documentation

## 2023-02-19 DIAGNOSIS — R4589 Other symptoms and signs involving emotional state: Secondary | ICD-10-CM | POA: Diagnosis not present

## 2023-02-19 DIAGNOSIS — F411 Generalized anxiety disorder: Secondary | ICD-10-CM | POA: Diagnosis not present

## 2023-02-19 NOTE — Therapy (Signed)
[place], [place], [address] Phone: [phone]   Fax:  [phone]  Occupational Therapy Treatment Virtual Visit via Video Note  I connected with [name] on 02/19/23 at  8:00 AM EST by a video enabled telemedicine application and verified that I am speaking with the correct person using two identifiers.  Location: Patient: home Provider: office   I discussed the limitations of evaluation and management by telemedicine and the availability of in person appointments. The patient expressed understanding and agreed to proceed.    The patient was advised to call back or seek an in-person evaluation if the symptoms worsen or if the condition fails to improve as anticipated.  I provided 55 minutes of non-face-to-face time during this encounter.   Patient Details  Name: Katherine Steele MRN: [id number] Date of Birth: October 09, 1971 No data recorded  Encounter Date: 02/19/2023   OT End of Session - 02/19/23 1307     Visit Number 14    Number of Visits 20    Date for OT Re-Evaluation 03/02/23    OT Start Time 1200    OT Stop Time 1255    OT Time Calculation (min) 55 min             Past Medical History:  Diagnosis Date   BMI 33.0-33.9,adult    Endometriosis    s/p hysterectomy   GAD (generalized anxiety disorder)    Dr. Annitta [place] psychiatrist   Gender dysphoria 2014   did undergo 10 mo testosterone treatment   GERD (gastroesophageal reflux disease)    HLD (hyperlipidemia) 08/12/2014   Mild off meds    MDD (major depressive disorder)    Dr. Annitta [place] psychiatrist   PONV (postoperative nausea and vomiting)    after finger surgery    Past Surgical History:  Procedure Laterality Date   CESAREAN SECTION     COLONOSCOPY  2007   WNL [facility])   COLONOSCOPY WITH PROPOFOL N/A 03/31/2021   WNL, rpt 10 yrs Bonna Gains, [place] B, MD)   ESOPHAGOGASTRODUODENOSCOPY (EGD) WITH PROPOFOL N/A 03/21/2018    barrett's without dysplasia, mild chronic gastritis, reflux gastroesophagitis Bonna Gains, [name], MD)   ESOPHAGOGASTRODUODENOSCOPY (EGD) WITH PROPOFOL N/A 03/31/2021   small HH, shatski ring, ?barretts - biopsies WNL Bonna Gains, Lennette Bihari, MD)   FINGER SURGERY Right 2007   little finger   OOPHORECTOMY Left 2006   TONSILLECTOMY  1975   TOTAL ABDOMINAL HYSTERECTOMY  12/2012   endometriosis and ovarian cysts s/p ovaries removed   TYMPANOSTOMY TUBE PLACEMENT Bilateral 1976    There were no vitals filed for this visit.   Subjective Assessment - 02/19/23 1306     Currently in Pain? No/denies    Pain Score 0-No pain               Group Session:  S: Feeling better today.   O: During today's OT group session, the patient participated in an educational segment about the importance of goal-setting and the application of the SMART framework to enhance daily life, particularly focusing on ADLs and iADLs. The session began with five open-ended pre-session questions that facilitated group discussion and introspection about their current relationship with goals. Following the introduction and educational segment, participants engaged in brainstorming and group discussions to devise hypothetical SMART goals. The session concluded with five post-session questions to reinforce understanding and facilitate reflection. Throughout the session, there was a range of engagement levels noted among the participants.  A:  Patient demonstrated a high level of engagement throughout the session. They actively participated in discussions, sharing personal experiences related to goal setting and challenges faced. Patient was able to clearly articulate an understanding of the SMART framework and proposed personal SMART goals related to their own ADLs with minimal assistance. They expressed enthusiasm about applying what they learned to their daily routine and appeared motivated to make changes.   P:  Continue to attend PHP OT group sessions 5x week for 4 weeks to promote daily structure, social engagement, and opportunities to develop and utilize adaptive strategies to maximize functional performance in preparation for safe transition and integration back into school, work, and the community. Plan to address topic of pt 2 in next OT group session.                    OT Education - 02/19/23 1306     Education Details SMART Goals 1              OT Short Term Goals - 01/31/23 1907       OT SHORT TERM GOAL #1   Title Client will develop and utilize a personalized coping toolbox containing at least five coping strategies to manage challenging situations, demonstrating their use in real-life scenarios by the end of therapy.    Time 4    Period Weeks    Status On-going    Target Date 03/02/23      OT SHORT TERM GOAL #2   Title By discharge, client will demonstrate the ability to adapt and modify routines when faced with unexpected events or changes, maintaining a balanced approach to daily tasks.      OT SHORT TERM GOAL #3   Title By the time of discharge, client will independently set, track, and make progress towards a long-term goal, demonstrating resilience in overcoming obstacles and seeking support when needed.                      Plan - 02/19/23 1307     Psychosocial Skills Interpersonal Interaction;Routines and Behaviors;Habits;Coping Strategies             Patient will benefit from skilled therapeutic intervention in order to improve the following deficits and impairments:       Psychosocial Skills: Interpersonal Interaction, Routines and Behaviors, Habits, Coping Strategies   Visit Diagnosis: Difficulty coping    Problem List Patient Active Problem List   Diagnosis Date Noted   MDD (major depressive disorder), recurrent episode, severe ([place]) 01/24/2023   Gender dysphoria 11/30/2022   Long term current use of cannabis  11/30/2022   High risk medication use 11/30/2022   Anxiety attack 09/28/2022   Adult attention deficit disorder 02/14/2021   Obesity, Class I, BMI 30-34.9    Surgical menopause, asymptomatic 12/09/2018   Schatzki's ring    Barrett's esophagus    Gastroesophageal reflux disease    Esophageal dysphagia    Hiatal hernia    Acquired trigger finger 09/19/2016   HLD (hyperlipidemia) 08/12/2014   Health maintenance examination 05/04/2014   Gender dysphoria in adult 07/17/2012   Anxiety disorder 02/07/2012   MDD (major depressive disorder), recurrent episode, moderate ([place]) 02/07/2012    Brantley Stage, OT 02/19/2023, 1:07 PM Cornell Barman, OT  [facility] HOSPITALIZATION PROGRAM [place], [place], [address] Phone: [phone]   Fax:  [phone]  Name: KALLYSTA LANASA MRN: [id number] Date of Birth: September 17, 1971

## 2023-02-20 ENCOUNTER — Other Ambulatory Visit (HOSPITAL_COMMUNITY): Payer: BC Managed Care – PPO | Attending: Psychiatry

## 2023-02-20 ENCOUNTER — Other Ambulatory Visit (HOSPITAL_COMMUNITY): Payer: BC Managed Care – PPO | Attending: Psychiatry | Admitting: Licensed Clinical Social Worker

## 2023-02-20 ENCOUNTER — Encounter (HOSPITAL_COMMUNITY): Payer: Self-pay

## 2023-02-20 DIAGNOSIS — R4589 Other symptoms and signs involving emotional state: Secondary | ICD-10-CM | POA: Diagnosis not present

## 2023-02-20 DIAGNOSIS — F411 Generalized anxiety disorder: Secondary | ICD-10-CM | POA: Diagnosis not present

## 2023-02-20 DIAGNOSIS — F332 Major depressive disorder, recurrent severe without psychotic features: Secondary | ICD-10-CM | POA: Diagnosis not present

## 2023-02-20 DIAGNOSIS — F649 Gender identity disorder, unspecified: Secondary | ICD-10-CM | POA: Insufficient documentation

## 2023-02-20 NOTE — Therapy (Signed)
[facility] [place], [place], [address] Phone: [phone]   Fax:  [phone]  Occupational Therapy Treatment Virtual Visit via Video Note  I connected with Katherine Steele on 02/20/23 at  8:00 AM EST by a video enabled telemedicine application and verified that I am speaking with the correct person using two identifiers.  Location: Patient: home Provider: office   I discussed the limitations of evaluation and management by telemedicine and the availability of in person appointments. The patient expressed understanding and agreed to proceed.    The patient was advised to call back or seek an in-person evaluation if the symptoms worsen or if the condition fails to improve as anticipated.  I provided 55 minutes of non-face-to-face time during this encounter.   Patient Details  Name: Katherine Steele MRN: [id number] Date of Birth: 12/21/1970 No data recorded  Encounter Date: 02/20/2023   OT End of Session - 02/20/23 2052     Visit Number 15    Number of Visits 20    Date for OT Re-Evaluation 03/02/23    OT Start Time 29    OT Stop Time 1255    OT Time Calculation (min) 55 min             Past Medical History:  Diagnosis Date   BMI 33.0-33.9,adult    Endometriosis    s/p hysterectomy   GAD (generalized anxiety disorder)    Dr. Annitta [place] psychiatrist   Gender dysphoria 2014   did undergo 10 mo testosterone treatment   GERD (gastroesophageal reflux disease)    HLD (hyperlipidemia) 08/12/2014   Mild off meds    MDD (major depressive disorder)    Dr. Annitta Jersey psychiatrist   PONV (postoperative nausea and vomiting)    after finger surgery    Past Surgical History:  Procedure Laterality Date   CESAREAN SECTION     COLONOSCOPY  2007   WNL [facility])   COLONOSCOPY WITH PROPOFOL N/A 03/31/2021   WNL, rpt 10 yrs Bonna Gains, [place] B, MD)   ESOPHAGOGASTRODUODENOSCOPY (EGD) WITH PROPOFOL N/A 03/21/2018    barrett's without dysplasia, mild chronic gastritis, reflux gastroesophagitis Bonna Gains, [name], MD)   ESOPHAGOGASTRODUODENOSCOPY (EGD) WITH PROPOFOL N/A 03/31/2021   small HH, shatski ring, ?barretts - biopsies WNL Bonna Gains, Lennette Bihari, MD)   FINGER SURGERY Right 2007   little finger   OOPHORECTOMY Left 2006   TONSILLECTOMY  1975   TOTAL ABDOMINAL HYSTERECTOMY  12/2012   endometriosis and ovarian cysts s/p ovaries removed   TYMPANOSTOMY TUBE PLACEMENT Bilateral 1976    There were no vitals filed for this visit.   Subjective Assessment - 02/20/23 2051     Currently in Pain? No/denies    Pain Score 0-No pain                Group Session:  S: Been doing much better here lately. Learning a lot from this group and the others.   O: During today's OT group session, the patient participated in an educational segment about the importance of goal-setting and the application of the SMART framework to enhance daily life, particularly focusing on ADLs and iADLs. The session began with five open-ended pre-session questions that facilitated group discussion and introspection about their current relationship with goals. Following the introduction and educational segment, participants engaged in brainstorming and group discussions to devise hypothetical SMART goals. The session concluded with five post-session questions to reinforce understanding and facilitate reflection. Throughout the session,  there was a range of engagement levels noted among the participants.   A:  Patient demonstrated a high level of engagement throughout the session. They actively participated in discussions, sharing personal experiences related to goal setting and challenges faced. Patient was able to clearly articulate an understanding of the SMART framework and proposed personal SMART goals related to their own ADLs with minimal assistance. They expressed enthusiasm about applying what they learned to their  daily routine and appeared motivated to make changes.    P: Continue to attend PHP OT group sessions 5x week for 4 weeks to promote daily structure, social engagement, and opportunities to develop and utilize adaptive strategies to maximize functional performance in preparation for safe transition and integration back into school, work, and the community. Plan to address topic of pt 3 in next OT group session.                   OT Education - 02/20/23 2052     Education Details SMART Goals 2              OT Short Term Goals - 01/31/23 1907       OT SHORT TERM GOAL #1   Title Client will develop and utilize a personalized coping toolbox containing at least five coping strategies to manage challenging situations, demonstrating their use in real-life scenarios by the end of therapy.    Time 4    Period Weeks    Status On-going    Target Date 03/02/23      OT SHORT TERM GOAL #2   Title By discharge, client will demonstrate the ability to adapt and modify routines when faced with unexpected events or changes, maintaining a balanced approach to daily tasks.      OT SHORT TERM GOAL #3   Title By the time of discharge, client will independently set, track, and make progress towards a long-term goal, demonstrating resilience in overcoming obstacles and seeking support when needed.                      Plan - 02/20/23 2052     Psychosocial Skills Interpersonal Interaction;Routines and Behaviors;Habits;Coping Strategies             Patient will benefit from skilled therapeutic intervention in order to improve the following deficits and impairments:       Psychosocial Skills: Interpersonal Interaction, Routines and Behaviors, Habits, Coping Strategies   Visit Diagnosis: Difficulty coping    Problem List Patient Active Problem List   Diagnosis Date Noted   MDD (major depressive disorder), recurrent episode, severe ([place]) 01/24/2023   Gender  dysphoria 11/30/2022   Long term current use of cannabis 11/30/2022   High risk medication use 11/30/2022   Anxiety attack 09/28/2022   Adult attention deficit disorder 02/14/2021   Obesity, Class I, BMI 30-34.9    Surgical menopause, asymptomatic 12/09/2018   Schatzki's ring    Barrett's esophagus    Gastroesophageal reflux disease    Esophageal dysphagia    Hiatal hernia    Acquired trigger finger 09/19/2016   HLD (hyperlipidemia) 08/12/2014   Health maintenance examination 05/04/2014   Gender dysphoria in adult 07/17/2012   Anxiety disorder 02/07/2012   MDD (major depressive disorder), recurrent episode, moderate ([place]) 02/07/2012    Brantley Stage, OT 02/20/2023, 8:52 PM  [facility], [address] [place], [place], [address] Phone: [phone]  Fax:  [phone]  Name: Katherine Steele MRN: [id number] Date of Birth: [birth year]

## 2023-02-21 ENCOUNTER — Other Ambulatory Visit (HOSPITAL_COMMUNITY): Payer: BC Managed Care – PPO | Attending: Psychiatry

## 2023-02-21 ENCOUNTER — Other Ambulatory Visit (HOSPITAL_COMMUNITY): Payer: BC Managed Care – PPO | Attending: Psychiatry | Admitting: Licensed Clinical Social Worker

## 2023-02-21 ENCOUNTER — Encounter (HOSPITAL_COMMUNITY): Payer: Self-pay

## 2023-02-21 DIAGNOSIS — F411 Generalized anxiety disorder: Secondary | ICD-10-CM | POA: Diagnosis not present

## 2023-02-21 DIAGNOSIS — F332 Major depressive disorder, recurrent severe without psychotic features: Secondary | ICD-10-CM | POA: Diagnosis not present

## 2023-02-21 DIAGNOSIS — R4589 Other symptoms and signs involving emotional state: Secondary | ICD-10-CM

## 2023-02-21 MED ORDER — ARIPIPRAZOLE 5 MG PO TABS: 5.0000 mg | ORAL_TABLET | Freq: Every day | ORAL | 0 refills | Status: DC

## 2023-02-21 NOTE — Therapy (Signed)
[place], [place], [address] Phone: [phone]   Fax:  [phone]  Occupational Therapy Treatment Virtual Visit via Video Note  I connected with Katherine Steele on 02/21/23 at  8:00 AM EST by a video enabled telemedicine application and verified that I am speaking with the correct person using two identifiers.  Location: Patient: home Provider: office   I discussed the limitations of evaluation and management by telemedicine and the availability of in person appointments. The patient expressed understanding and agreed to proceed.    The patient was advised to call back or seek an in-person evaluation if the symptoms worsen or if the condition fails to improve as anticipated.  I provided 55 minutes of non-face-to-face time during this encounter.   Patient Details  Name: Katherine Steele MRN: [id number] Date of Birth: 1971/04/21 No data recorded  Encounter Date: 02/21/2023   OT End of Session - 02/21/23 2016     Visit Number 16    Number of Visits 20    Date for OT Re-Evaluation 03/02/23    OT Start Time 1200    OT Stop Time 1255    OT Time Calculation (min) 55 min             Past Medical History:  Diagnosis Date   BMI 33.0-33.9,adult    Endometriosis    s/p hysterectomy   GAD (generalized anxiety disorder)    Dr. Annitta Jersey psychiatrist   Gender dysphoria 2014   did undergo 10 mo testosterone treatment   GERD (gastroesophageal reflux disease)    HLD (hyperlipidemia) 08/12/2014   Mild off meds    MDD (major depressive disorder)    Dr. Annitta Jersey psychiatrist   PONV (postoperative nausea and vomiting)    after finger surgery    Past Surgical History:  Procedure Laterality Date   CESAREAN SECTION     COLONOSCOPY  2007   WNL [facility])   COLONOSCOPY WITH PROPOFOL N/A 03/31/2021   WNL, rpt 10 yrs Bonna Gains, [place] B, MD)   ESOPHAGOGASTRODUODENOSCOPY (EGD) WITH PROPOFOL N/A 03/21/2018    barrett's without dysplasia, mild chronic gastritis, reflux gastroesophagitis Bonna Gains, [name], MD)   ESOPHAGOGASTRODUODENOSCOPY (EGD) WITH PROPOFOL N/A 03/31/2021   small HH, shatski ring, ?barretts - biopsies WNL Bonna Gains, Lennette Bihari, MD)   FINGER SURGERY Right 2007   little finger   OOPHORECTOMY Left 2006   TONSILLECTOMY  1975   TOTAL ABDOMINAL HYSTERECTOMY  12/2012   endometriosis and ovarian cysts s/p ovaries removed   TYMPANOSTOMY TUBE PLACEMENT Bilateral 1976    There were no vitals filed for this visit.   Subjective Assessment - 02/21/23 2016     Currently in Pain? No/denies    Pain Score 0-No pain    Multiple Pain Sites No                  Group Session:  S: Doing much better. The PHP program has helped me so much. I've learned so much from this process.   O: During today's OT group session, the patient participated in an educational segment about the importance of goal-setting and the application of the SMART framework to enhance daily life, particularly focusing on ADLs and iADLs. The session began with five open-ended pre-session questions that facilitated group discussion and introspection about their current relationship with goals. Following the introduction and educational segment, participants engaged in brainstorming and group discussions to devise hypothetical SMART goals. The session concluded with  five post-session questions to reinforce understanding and facilitate reflection. Throughout the session, there was a range of engagement levels noted among the participants.   A:  Patient demonstrated a high level of engagement throughout the session. They actively participated in discussions, sharing personal experiences related to goal setting and challenges faced. Patient was able to clearly articulate an understanding of the SMART framework and proposed personal SMART goals related to their own ADLs with minimal assistance. They expressed enthusiasm  about applying what they learned to their daily routine and appeared motivated to make changes.  P: Continue to attend PHP OT group sessions 5x week for 4 weeks to promote daily structure, social engagement, and opportunities to develop and utilize adaptive strategies to maximize functional performance in preparation for safe transition and integration back into school, work, and the community. Plan to address topic of pt 4 in next OT group session.                 OT Education - 02/21/23 2016     Education Details SMART Goals 3              OT Short Term Goals - 01/31/23 1907       OT SHORT TERM GOAL #1   Title Client will develop and utilize a personalized coping toolbox containing at least five coping strategies to manage challenging situations, demonstrating their use in real-life scenarios by the end of therapy.    Time 4    Period Weeks    Status On-going    Target Date 03/02/23      OT SHORT TERM GOAL #2   Title By discharge, client will demonstrate the ability to adapt and modify routines when faced with unexpected events or changes, maintaining a balanced approach to daily tasks.      OT SHORT TERM GOAL #3   Title By the time of discharge, client will independently set, track, and make progress towards a long-term goal, demonstrating resilience in overcoming obstacles and seeking support when needed.                      Plan - 02/21/23 2016     Psychosocial Skills Interpersonal Interaction;Routines and Behaviors;Habits;Coping Strategies             Patient will benefit from skilled therapeutic intervention in order to improve the following deficits and impairments:       Psychosocial Skills: Interpersonal Interaction, Routines and Behaviors, Habits, Coping Strategies   Visit Diagnosis: Difficulty coping    Problem List Patient Active Problem List   Diagnosis Date Noted   MDD (major depressive disorder), recurrent episode,  severe ([place]) 01/24/2023   Gender dysphoria 11/30/2022   Long term current use of cannabis 11/30/2022   High risk medication use 11/30/2022   Anxiety attack 09/28/2022   Adult attention deficit disorder 02/14/2021   Obesity, Class I, BMI 30-34.9    Surgical menopause, asymptomatic 12/09/2018   Schatzki's ring    Barrett's esophagus    Gastroesophageal reflux disease    Esophageal dysphagia    Hiatal hernia    Acquired trigger finger 09/19/2016   HLD (hyperlipidemia) 08/12/2014   Health maintenance examination 05/04/2014   Gender dysphoria in adult 07/17/2012   Anxiety disorder 02/07/2012   MDD (major depressive disorder), recurrent episode, moderate ([place]) 02/07/2012    Brantley Stage, OT 02/21/2023, 8:17 PM  [facility], [place]  [place], [place], [address] Phone: [phone]   Fax:  [phone]  Name: LYNAE FORAN MRN: [id number] Date of Birth: 1971/07/23

## 2023-02-21 NOTE — Progress Notes (Signed)
Virtual Visit via Video Note  I connected with Katherine Steele on 02/21/23 at  9:00 AM EST by a video enabled telemedicine application and verified that I am speaking with the correct person using two identifiers.  Location: Patient: Home Provider: Office   I discussed the limitations of evaluation and management by telemedicine and the availability of in person appointments. The patient expressed understanding and agreed to proceed.    I discussed the assessment and treatment plan with the patient. The patient was provided an opportunity to ask questions and all were answered. The patient agreed with the plan and demonstrated an understanding of the instructions.   The patient was advised to call back or seek an in-person evaluation if the symptoms worsen or if the condition fails to improve as anticipated.    Katherine Busman, MD  [facility] Psych Discharge Summary  Katherine Steele [id number]  Admission date: 01/29/2023 Discharge date: 02/22/2023  Reason for admission: SA, MDD, and irritability  Progress in Program Toward Treatment Goals: Progressing as evidenced by improved insight into decompensation and using coping skills to prevent further decompensation of mood  Progress (rationale):  Katherine Steele is a 52 yr old female who presents via Virtual Video Visit and discharge.  PPHx is significant for Depression, GAD, and Gender Dysphoria, and 2 Suicide Attempts Via OD (last 01/2023) and 2 Psychiatric Hospitalizations (last- Katherine Steele 01/2023), and no history of Self Injurious Behavior.   Patient was initially having SA, MDD, and irritability. Patient reports that she feels she has learned a lot of skills regarding handling emotions and this helps her feel in more in control of thoughts. Patient reports that she thinks that there is a less chance of repeat SA, because she has learned coping skills, boundary setting. Patient reports she is already implementing these  things in her home and has found it helpful. Patient reports that she has some anxiety about returning to work, but is hopeful. Patient reports that she has really found Abilify very helpful.   Patient reports that she has had some dysphoric events, but she is now more aware of her signs of depression. Patient reports that this past weekend she noted she was more dysphoric, decreased motivation , decreased appetite. Patient reports that she self titrated up to '150mg'$  of Effexor for 2 days, and while she feels that her mood slightly improved, she felt a bit more "obsessive", her motivation did not improve. Patient reports that she thinks her appetite lowering is actually good as it is back to normal, as she thinks the Abilify initially made her eat more.  Patient reports that now she is been able to delineate the Abilify may be led to increased cravings, she does feel she is aware, and will work on restraint and increasing exercise.  Patient feels comfortable increasing Abilify as adjunct to Effexor for mood.  Currently- Mood low, motivation low, denies anhedonia, some feelings of hopelessness and is now 3/7 days. Patient denies SI, HI, and AVH. Patient denies feeling nervous, keyed up, or on edge. Patient also feels her concentration has decline in the last few days as well. Patient reports that her sleep has declined over the last few nights. Patient denies racing thoughts. Patient denies having any issues with impulse control or frequent irritability.   She denies more than 2 glasses of wine and no other substances since mood has started to decline again.   MDD, Recurrent, Severe, w/out Psychosis  GAD:  Medication -  Continue Effexor XR 112.'5mg'$  - Increase Abilify to '5mg'$  daily, for augmentation, send in 15 days supply  Has an appt at with [facility], who does testing for ADHD.  Discharge Plan: Going to IOP,  Referral to Psychiatrist, Dr Katherine Steele  Collaboration of Care: Other  Therapist  Patient/Guardian was advised Release of Information must be obtained prior to any record release in order to collaborate their care with an outside provider. Patient/Guardian was advised if they have not already done so to contact the registration department to sign all necessary forms in order for [name] to release information regarding their care.   Consent: Patient/Guardian gives verbal consent for treatment and assignment of benefits for services provided during this visit. Patient/Guardian expressed understanding and agreed to proceed.    Katherine Dunnings, MD PGY-3 [facility] 02/21/2023

## 2023-02-24 NOTE — Psych (Signed)
Virtual Visit via Video Note  I connected with [name] on 02/19/23 at  9:00 AM EST by a video enabled telemedicine application and verified that I am speaking with the correct person using two identifiers.  Location: Patient: patient home Provider: clinical home office   I discussed the limitations of evaluation and management by telemedicine and the availability of in person appointments. The patient expressed understanding and agreed to proceed.  I discussed the assessment and treatment plan with the patient. The patient was provided an opportunity to ask questions and all were answered. The patient agreed with the plan and demonstrated an understanding of the instructions.   The patient was advised to call back or seek an in-person evaluation if the symptoms worsen or if the condition fails to improve as anticipated.  Pt was provided 240 minutes of non-face-to-face time during this encounter.   Lorin Glass, LCSW   [facility] PHP THERAPIST PROGRESS NOTE  JASDEEP MCGOOGAN [id number]  Session Time: 9:00- 10:00  Participation Level: Active  Behavioral Response: CasualAlertDepressed  Type of Therapy: Group Therapy  Treatment Goals addressed: Coping  Progress Towards Goals: Progressing  Interventions: CBT, DBT, Supportive, and Reframing  Summary: Katherine Steele is a 52 y.o. female who presents with depression and anxiety symptoms.  Clinician led check-in regarding current stressors and situation, and review of patient completed daily inventory. Clinician utilized active listening and empathetic response and validated patient emotions. Clinician facilitated processing group on pertinent issues.?    Therapist Response: Patient arrived within time allowed. Patient rates her mood at a 6 on a scale of 1-10 with 10 being best. Pt states she feels "a little down." Pt states she slept 7 hours and ate 3x. Pt reports she is physically tired and it "takes too much effort" to be upbeat.  Pt states she is struggling more and more in the relationship with her husband as she is getting more stable, and feels she is not getting the support she needs. Patient able to process. Patient engaged in discussion.             Session Time: 10:00 am - 11:00 am   Participation Level: Active   Behavioral Response: CasualAlertDepressed   Type of Therapy: Group Therapy   Treatment Goals addressed: Coping   Progress Towards Goals: Progressing   Interventions: CBT, DBT, Solution Focused, Strength-based, Supportive, and Reframing   Therapist Response: Cln introduced DBT distress tolerance distraction skills. Cln provides context for distraction skills and why distraction is a foundational way to manage mood dysregulation. Group discussed how to apply distraction.    Therapist Response: Pt engaged in discussion and reports understanding of how distraction can be applied to manage big feelings.            Session Time: 11:00 -12:00   Participation Level: Active   Behavioral Response: CasualAlertDepressed   Type of Therapy: Group Therapy   Treatment Goals addressed: Coping   Progress Towards Goals: Progressing   Interventions: CBT, DBT, Solution Focused, Strength-based, Supportive, and Reframing   Summary: Cln continued topic of DBT distress tolerance skills and the ACCEPTS distraction skill. Group reviewed A-C-C skills and discussed how they can practice them in their every day life.    Therapist Response: Pt engaged in discussion and is able to determine ways to incorporate skills into their life.            Session Time: 12:00 -1:00   Participation Level: Active   Behavioral Response: CasualAlertDepressed  Type of Therapy: Group therapy, Occupational Therapy   Treatment Goals addressed: Coping   Progress Towards Goals: Progressing   Interventions: Supportive; Psychoeducation   Summary: 12:00 - 12:50: Occupational Therapy group led by cln E. Hollan. 12:50  - 1:00 Clinician assessed for immediate needs, medication compliance and efficacy, and safety concerns.   Therapist Response: 12:00 - 12:50: See OT note 12:50 - 1:00 pm: At check-out, patient reports no immediate concerns. Patient demonstrates progress as evidenced by continued engagement in group and responsiveness to treatment. Patient denies SI/HI/self-harm thoughts at the end of group.    Suicidal/Homicidal: Nowithout intent/plan  Plan: Pt will continue in PHP while working to decrease depression and anxiety symptoms, decrease negative response to stressors, and increase ability to manage symptoms in a healthy manner.   Collaboration of Care: Medication Management AEB A [place]  Patient/Guardian was advised Release of Information must be obtained prior to any record release in order to collaborate their care with an outside provider. Patient/Guardian was advised if they have not already done so to contact the registration department to sign all necessary forms in order for [name] to release information regarding their care.   Consent: Patient/Guardian gives verbal consent for treatment and assignment of benefits for services provided during this visit. Patient/Guardian expressed understanding and agreed to proceed.   Diagnosis: Severe episode of recurrent major depressive disorder, without psychotic features ([place]) [F33.2]    1. Severe episode of recurrent major depressive disorder, without psychotic features ([place])   2. GAD (generalized anxiety disorder)       Lorin Glass, LCSW 02/24/2023

## 2023-02-24 NOTE — Psych (Signed)
Virtual Visit via Video Note  I connected with Katherine Steele on 02/21/23 at  9:00 AM EST by a video enabled telemedicine application and verified that I am speaking with the correct person using two identifiers.  Location: Patient: patient home Provider: clinical home office   I discussed the limitations of evaluation and management by telemedicine and the availability of in person appointments. The patient expressed understanding and agreed to proceed.  I discussed the assessment and treatment plan with the patient. The patient was provided an opportunity to ask questions and all were answered. The patient agreed with the plan and demonstrated an understanding of the instructions.   The patient was advised to call back or seek an in-person evaluation if the symptoms worsen or if the condition fails to improve as anticipated.  Pt was provided 240 minutes of non-face-to-face time during this encounter.   Lorin Glass, LCSW   [facility] PHP THERAPIST PROGRESS NOTE  Katherine Steele [id number]  Session Time: 9:00- 10:00  Participation Level: Active  Behavioral Response: CasualAlertDepressed  Type of Therapy: Group Therapy  Treatment Goals addressed: Coping  Progress Towards Goals: Progressing  Interventions: CBT, DBT, Supportive, and Reframing  Summary: Katherine Steele is a 52 y.o. female who presents with depression and anxiety symptoms.  Clinician led check-in regarding current stressors and situation, and review of patient completed daily inventory. Clinician utilized active listening and empathetic response and validated patient emotions. Clinician facilitated processing group on pertinent issues.?    Therapist Response: Patient arrived within time allowed. Patient rates her mood at a 7.5 on a scale of 1-10 with 10 being best. Pt states she feels "pretty good." Pt states she slept 3.5 hours and ate 3x. Pt reports she had a "laid back day." Pt shares she felt irritable on the  phone and ended the call rather than continuing when in her feelings. Pt states she continues to be engaged with planning her new project and it is making her excited. Patient able to process. Patient engaged in discussion.             Session Time: 10:00 am - 11:00 am   Participation Level: Active   Behavioral Response: CasualAlertDepressed   Type of Therapy: Group Therapy   Treatment Goals addressed: Coping   Progress Towards Goals: Progressing   Interventions: CBT, DBT, Solution Focused, Strength-based, Supportive, and Reframing   Therapist Response: Cln introduced wellness topic of sleep hygiene. Cln discussed ways in which poor sleep affects our mood and overall wellness. Cln provided education on sleep hygiene techniques and principles. Group discussed their current sleep issues and how they can apply sleep hygiene skills to improve their quality of sleep.    Therapist Response:  Pt engaged in discussion and identifies which sleep hygiene skill they will apply first.             Session Time: 11:00 -12:00   Participation Level: Active   Behavioral Response: CasualAlertDepressed   Type of Therapy: Group Therapy   Treatment Goals addressed: Coping   Progress Towards Goals: Progressing   Interventions: CBT, DBT, Solution Focused, Strength-based, Supportive, and Reframing   Summary: Cln introduced the "Thoughts" distraction skills from DBT distress tolerance skill ACCEPTS. Cln discussed how this set of distraction skills can be helpful when in situations where outside resources are limited or unavailable. Group practiced and brainstormed ways to apply the Thought skill.    Therapist Response: Pt engaged in discussion and is able to determine ways to  incorporate skill into their life.            Session Time: 12:00 -1:00   Participation Level: Active   Behavioral Response: CasualAlertDepressed   Type of Therapy: Group therapy, Occupational Therapy    Treatment Goals addressed: Coping   Progress Towards Goals: Progressing   Interventions: Supportive; Psychoeducation   Summary: 12:00 - 12:50: Occupational Therapy group led by cln E. Hollan. 12:50 - 1:00 Clinician assessed for immediate needs, medication compliance and efficacy, and safety concerns.   Therapist Response: 12:00 - 12:50: See OT note 12:50 - 1:00 pm: At check-out, patient reports no immediate concerns. Patient demonstrates progress as evidenced by continued engagement in group and responsiveness to treatment. Patient denies SI/HI/self-harm thoughts at the end of group.    Suicidal/Homicidal: Nowithout intent/plan  Plan: EDIT 3/8 - Pt was scheduled to d/c on 3/8 and did not attend last day Pt will discharge from [place] due to meeting treatment goals of decreased depression and anxiety symptoms, decreased negative response to stressors, and increased ability to manage symptoms in a healthy manner. Pt will step down to IOP within this agency for continued recovery beginning 3/11. Pt and provider are aligned with discharge. Pt denies SI/HI at time of discharge.   Collaboration of Care: Medication Management AEB A Pashayan  Patient/Guardian was advised Release of Information must be obtained prior to any record release in order to collaborate their care with an outside provider. Patient/Guardian was advised if they have not already done so to contact the registration department to sign all necessary forms in order for [name] to release information regarding their care.   Consent: Patient/Guardian gives verbal consent for treatment and assignment of benefits for services provided during this visit. Patient/Guardian expressed understanding and agreed to proceed.   Diagnosis: Severe episode of recurrent major depressive disorder, without psychotic features ([place]) [F33.2]    1. Severe episode of recurrent major depressive disorder, without psychotic features ([place])   2. GAD (generalized  anxiety disorder)       Lorin Glass, LCSW 02/24/2023

## 2023-02-24 NOTE — Psych (Signed)
Virtual Visit via Video Note  I connected with Katherine Steele on 02/18/23 at  9:00 AM EST by a video enabled telemedicine application and verified that I am speaking with the correct person using two identifiers.  Location: Patient: patient home Provider: clinical home office   I discussed the limitations of evaluation and management by telemedicine and the availability of in person appointments. The patient expressed understanding and agreed to proceed.  I discussed the assessment and treatment plan with the patient. The patient was provided an opportunity to ask questions and all were answered. The patient agreed with the plan and demonstrated an understanding of the instructions.   The patient was advised to call back or seek an in-person evaluation if the symptoms worsen or if the condition fails to improve as anticipated.  Pt was provided 240 minutes of non-face-to-face time during this encounter.   Lorin Glass, Katherine Steele   [facility] BH PHP THERAPIST PROGRESS NOTE  Katherine Steele [id number]  Session Time: 9:00- 10:00  Participation Level: Active  Behavioral Response: CasualAlertDepressed  Type of Therapy: Group Therapy  Treatment Goals addressed: Coping  Progress Towards Goals: Progressing  Interventions: CBT, DBT, Supportive, and Reframing  Summary: Katherine Steele is a 52 y.o. female who presents with depression and anxiety symptoms.  Clinician led check-in regarding current stressors and situation, and review of patient completed daily inventory. Clinician utilized active listening and empathetic response and validated patient emotions. Clinician facilitated processing group on pertinent issues.?    Therapist Response: Patient arrived within time allowed. Patient rates her mood at a 7 on a scale of 1-10 with 10 being best. Pt states she feels "pretty good." Pt states she slept 8 hours and ate 3x. Pt reports her weekend went smoothly except for one issue with her husband. Pt  states she ran some errands and mainly spent time at home. Pt reports she attempted to set a boundary with her husband and "it felt good to stand up for myself." Pt continues to struggle with self-esteem and feeling justified in taking care of herself.  Patient able to process. Patient engaged in discussion.             Session Time: 10:00 am - 11:00 am   Participation Level: Active   Behavioral Response: CasualAlertDepressed   Type of Therapy: Group Therapy   Treatment Goals addressed: Coping   Progress Towards Goals: Progressing   Interventions: CBT, DBT, Solution Focused, Strength-based, Supportive, and Reframing   Therapist Response: Cln led discussion on "spiraling" or when our thoughts compound on one another to descend our feelings into worse and worse situations. Group members discussed the ways in which spiraling is difficult for them and when they are especially vulnerable. Cln offered DBT distraction skills as a way to halt the spiral once you recognize it.    Therapist Response: Pt is able to increase awareness of spiraling throughout the discussion.            Session Time: 11:00 -12:00   Participation Level: Active   Behavioral Response: CasualAlertDepressed   Type of Therapy: Group Therapy   Treatment Goals addressed: Coping   Progress Towards Goals: Progressing   Interventions: CBT, DBT, Solution Focused, Strength-based, Supportive, and Reframing   Summary: Cln led discussion on unrealistic expectations and the ways expectations can alter perspective. Group members discussed feelings and situations that have occurred due to expectation and how to know if they are unrealistic. Cln brought in CBT thought challenging and reframing to  aid discussion.    Therapist Response: Pt engaged in discussion and reports gaining insight.            Session Time: 12:00 -1:00   Participation Level: Active   Behavioral Response: CasualAlertDepressed   Type of  Therapy: Group therapy, Occupational Therapy   Treatment Goals addressed: Coping   Progress Towards Goals: Progressing   Interventions: Supportive; Psychoeducation   Summary: 12:00 - 12:50: Occupational Therapy group led by cln E. Hollan. 12:50 - 1:00 Clinician assessed for immediate needs, medication compliance and efficacy, and safety concerns.   Therapist Response: 12:00 - 12:50: See OT note 12:50 - 1:00 pm: At check-out, patient reports no immediate concerns. Patient demonstrates progress as evidenced by continued engagement in group and responsiveness to treatment. Patient denies SI/HI/self-harm thoughts at the end of group.    Suicidal/Homicidal: Nowithout intent/plan  Plan: Pt will continue in PHP while working to decrease depression and anxiety symptoms, decrease negative response to stressors, and increase ability to manage symptoms in a healthy manner.   Collaboration of Care: Medication Management AEB A Pashayan  Patient/Guardian was advised Release of Information must be obtained prior to any record release in order to collaborate their care with an outside provider. Patient/Guardian was advised if they have not already done so to contact the registration department to sign all necessary forms in order for [name] to release information regarding their care.   Consent: Patient/Guardian gives verbal consent for treatment and assignment of benefits for services provided during this visit. Patient/Guardian expressed understanding and agreed to proceed.   Diagnosis: Severe episode of recurrent major depressive disorder, without psychotic features ([place]) [F33.2]    1. Severe episode of recurrent major depressive disorder, without psychotic features ([place])   2. GAD (generalized anxiety disorder)       Lorin Glass, Katherine Steele 02/24/2023

## 2023-02-24 NOTE — Psych (Signed)
Virtual Visit via Video Note  I connected with Katherine Steele on 02/20/23 at  9:00 AM EST by a video enabled telemedicine application and verified that I am speaking with the correct person using two identifiers.  Location: Patient: patient home Provider: clinical home office   I discussed the limitations of evaluation and management by telemedicine and the availability of in person appointments. The patient expressed understanding and agreed to proceed.  I discussed the assessment and treatment plan with the patient. The patient was provided an opportunity to ask questions and all were answered. The patient agreed with the plan and demonstrated an understanding of the instructions.   The patient was advised to call back or seek an in-person evaluation if the symptoms worsen or if the condition fails to improve as anticipated.  Pt was provided 240 minutes of non-face-to-face time during this encounter.   Katherine Glass, LCSW   [facility] BH PHP THERAPIST PROGRESS NOTE  PAISLY STACER [id number]  Session Time: 9:00- 10:00  Participation Level: Active  Behavioral Response: CasualAlertDepressed  Type of Therapy: Group Therapy  Treatment Goals addressed: Coping  Progress Towards Goals: Progressing  Interventions: CBT, DBT, Supportive, and Reframing  Summary: Katherine Steele is a 52 y.o. female who presents with depression and anxiety symptoms.  Clinician led check-in regarding current stressors and situation, and review of patient completed daily inventory. Clinician utilized active listening and empathetic response and validated patient emotions. Clinician facilitated processing group on pertinent issues.?    Therapist Response: Patient arrived within time allowed. Patient rates her mood at a 8 on a scale of 1-10 with 10 being best. Pt states she feels "better today." Pt states she slept 7 hours and ate 3x. Pt reports states she had a "deep" conversation with her husband that she felt  good overall positive about. Pt states she spent time outside which was helpful. Pt reports having an upcoming bathroom project and focusing on the design is a helpful distraction. Patient able to process. Patient engaged in discussion.             Session Time: 10:00 am - 11:00 am   Participation Level: Active   Behavioral Response: CasualAlertDepressed   Type of Therapy: Group Therapy   Treatment Goals addressed: Coping   Progress Towards Goals: Progressing   Interventions: CBT, DBT, Solution Focused, Strength-based, Supportive, and Reframing   Therapist Response: Cln led processing group for pt's current struggles. Group members shared stressors and provided support and feedback. Cln brought in topics of boundaries, healthy relationships, and unhealthy thought processes to inform discussion.    Therapist Response: Pt able to process and provide support to group.          Session Time: 11:00 -12:00   Participation Level: Active   Behavioral Response: CasualAlertDepressed   Type of Therapy: Group Therapy, Spiritual Care   Treatment Goals addressed: Coping   Progress Towards Goals: Progressing   Interventions: Supportive, Education   Summary:  Katherine Steele, Chaplain, led group.   Therapist Response: Pt participated           Session Time: 12:00 -1:00   Participation Level: Active   Behavioral Response: CasualAlertDepressed   Type of Therapy: Group therapy, Occupational Therapy   Treatment Goals addressed: Coping   Progress Towards Goals: Progressing   Interventions: Supportive; Psychoeducation   Summary: 12:00 - 12:50: Occupational Therapy group led by cln E. Hollan. 12:50 - 1:00 Clinician assessed for immediate needs, medication compliance and efficacy, and safety concerns.  Therapist Response: 12:00 - 12:50: See OT note 12:50 - 1:00 pm: At check-out, patient reports no immediate concerns. Patient demonstrates progress as evidenced by continued  engagement in group and responsiveness to treatment. Patient denies SI/HI/self-harm thoughts at the end of group.    Suicidal/Homicidal: Nowithout intent/plan  Plan: Pt will continue in PHP while working to decrease depression and anxiety symptoms, decrease negative response to stressors, and increase ability to manage symptoms in a healthy manner.   Collaboration of Care: Medication Management AEB A Pashayan  Patient/Guardian was advised Release of Information must be obtained prior to any record release in order to collaborate their care with an outside provider. Patient/Guardian was advised if they have not already done so to contact the registration department to sign all necessary forms in order for [name] to release information regarding their care.   Consent: Patient/Guardian gives verbal consent for treatment and assignment of benefits for services provided during this visit. Patient/Guardian expressed understanding and agreed to proceed.   Diagnosis: Severe episode of recurrent major depressive disorder, without psychotic features ([place]) [F33.2]    1. Severe episode of recurrent major depressive disorder, without psychotic features ([place])   2. GAD (generalized anxiety disorder)       Katherine Glass, LCSW 02/24/2023

## 2023-02-25 ENCOUNTER — Encounter (HOSPITAL_COMMUNITY): Payer: Self-pay | Admitting: Psychiatry

## 2023-02-25 ENCOUNTER — Other Ambulatory Visit (HOSPITAL_COMMUNITY): Payer: BC Managed Care – PPO | Attending: Psychiatry | Admitting: Psychiatry

## 2023-02-25 DIAGNOSIS — F640 Transsexualism: Secondary | ICD-10-CM

## 2023-02-25 DIAGNOSIS — F331 Major depressive disorder, recurrent, moderate: Secondary | ICD-10-CM

## 2023-02-25 DIAGNOSIS — F332 Major depressive disorder, recurrent severe without psychotic features: Secondary | ICD-10-CM | POA: Insufficient documentation

## 2023-02-25 DIAGNOSIS — Z79899 Other long term (current) drug therapy: Secondary | ICD-10-CM | POA: Diagnosis not present

## 2023-02-25 DIAGNOSIS — F419 Anxiety disorder, unspecified: Secondary | ICD-10-CM

## 2023-02-25 DIAGNOSIS — F411 Generalized anxiety disorder: Secondary | ICD-10-CM | POA: Insufficient documentation

## 2023-02-25 NOTE — Progress Notes (Signed)
Virtual Visit via Video Note  I connected with Katherine Steele on '@TODAY'$ @ at  9:00 AM EDT by a video enabled telemedicine application and verified that I am speaking with the correct person using two identifiers.  Location: Patient: at home Provider: at office   I discussed the limitations of evaluation and management by telemedicine and the availability of in person appointments. The patient expressed understanding and agreed to proceed.  I discussed the assessment and treatment plan with the patient. The patient was provided an opportunity to ask questions and all were answered. The patient agreed with the plan and demonstrated an understanding of the instructions.   The patient was advised to call back or seek an in-person evaluation if the symptoms worsen or if the condition fails to improve as anticipated.  I provided 30 minutes of non-face-to-face time during this encounter.   Dellia Nims, M.Ed,CNA   Patient ID: Katherine Steele, female   DOB: 1971/03/30, 52 y.o.   MRN: [id number] As per previous [facility] note states:  "Katherine Steele is a 52yo female referred to [facility] by Dr. Shea Evans after a brief hospitalization following a suicide attempt via overdose. She cites her stressors as people in general, finances, and her identity as a trans female who chooses not to transition or use female pronouns. She states she struggles with reconciling her identity with her strong [place] faith. She reports decreased ADLs in taking care of household chores, but states they are improving. She reports she previously saw a therapist at [facility] in [place] but stopped because she is searching for a Marketing executive. She endorses two psychiatric hospitalizations, one at age [age] and one a week ago, both of which followed a suicide attempt by intentional overdose. She reports she takes Prozac and Effexor and is diagnosed with GAD and MDD. She denies SI at this time. She denies HI, AVH, recent substance use and history of  substance abuse, medical diagnoses, and NSSI. She reports family history of alcohol abuse, depression, and three completed suicides, all on her father's side. She cites her husband and 21yo son as her supports, both of whom she lives with. She reports she previously smoked marijuana but stopped 2-3 months ago due to increased paranoia and obsessive thoughts with use. She states there is a firearm in the home that her husband has secured."  Patient stepped down from [facility] today and started [facility].  Reports continued concentration issues.  Scored on PHQ-9=14. States that the PHP groups were very helpful; hopes to learn further coping skills.  Denies SI/HI or A/V hallucinations. A:  Oriented pt.   Pt was advised of ROI must be obtained prior to any records release in order to collaborate her care with an outside provider.  Pt was advised if she has not already done so to contact the front desk to sign all necessary forms in order for [facility] to release info re: her care.  Consent:  Pt gives verbal consent for tx and assignment of benefits for services provided during this telehealth group process.  Pt expressed understanding and agreed to proceed. Collaboration of care:  Collaborate with Dr. [name] AEB and Shade Flood, LCSW AEB, Dr. Lyndel Pleasure and Gillian Shields, LCSW AEB. Encouraged support groups through [facility]. Pt will improve her mood as evidenced by being happy again, managing her mood and coping with daily stressors for 5 out of 7 days for 60 days. R:  Pt receptive.    Dellia Nims, M.Ed, CNA

## 2023-02-25 NOTE — Progress Notes (Signed)
Virtual Visit via Video Note  I connected with Katherine Steele on 02/25/23 at  9:00 AM EDT by a video enabled telemedicine application and verified that I am speaking with the correct person using two identifiers.  Location: Patient: Home Provider: Office   I discussed the limitations of evaluation and management by telemedicine and the availability of in person appointments. The patient expressed understanding and agreed to proceed.    I discussed the assessment and treatment plan with the patient. The patient was provided an opportunity to ask questions and all were answered. The patient agreed with the plan and demonstrated an understanding of the instructions.   The patient was advised to call back or seek an in-person evaluation if the symptoms worsen or if the condition fails to improve as anticipated.    Freida Busman, MD   Psychiatric Initial Adult Assessment   Patient Identification: Katherine Steele MRN:  [id number] Date of Evaluation:  02/25/2023 Referral Source: PHP Chief Complaint:   Chief Complaint  Patient presents with   Anxiety   Depression   Visit Diagnosis:    ICD-10-CM   1. Gender dysphoria in adult  F64.0     2. MDD (major depressive disorder), recurrent episode, moderate (HCC)  F33.1     3. Anxiety disorder, unspecified type  F41.9       History of Present Illness:   Katherine Steele is a 52 yr old female who presents via Virtual Video Visit and discharge.  PPHx is significant for Depression, GAD, and Gender Dysphoria, and 2 Suicide Attempts Via OD (last 01/2023) and 2 Psychiatric Hospitalizations (last- [facility] 01/2023), and no history of Self Injurious Behavior. Patient completed PHP 3/7 and starts IOP 02/25/2023. Current medication regimen includes:  Effexor XR 112.'5mg'$  Abilify '5mg'$    Per PHP intake "She reports that she recently attempted suicide due to a lot of stress at work. She reports that she wants to learn coping skills to better handle what others  think of her. She reports that it is due to her being a sensitive heart. She reports that these troubles with work started about 2 years ago. She reports that in the summer 2022 she left her job for a different job. She reports that she ended up not getting that 1 due to failing a drug test due to [facility] use but likely was able to get her old job back. She reports that since then there has been a lot of issues. She reports that also there have been claims of racism at work and has just been very tough. "  Patient reports that prior to [facility], she has been struggling with depression. She was eating and sleeping ok, but had severe anhedonia. Patient reports she has severe anxiety as well, and this was also new for her. Patient reports that she has been on Prozac, but felt like it was no longer working. Patient reports that she was feeling more emotional. Patient reports that she would ruminate on very small things. Patient reports that she was struggling with focus due to this.   Patient reports that she believes her depression was triggered by a disagreement with someone at her job about who she was voting for(she would prefer to not talk about this in a group session). She reports that she felt she was targeted at work. Patient reports that she was called a "racist" because she voted for trump and then she felt like she had no legal outlet. Patient reports that she  also really liked the co-worker that this discussion occurred with and they worked well together. Patient reports that she feels like she leads a life and a home that suggest belief in multicultural, multiethnic, and minority support so this event really hurt her. She hoped that she would be able to at least have a cordial relationship with the co-worker. She reports that something similar occurred with another co-worker a year ago who she felt really close to. Patient reports that she began to feel ostracized and found out that people were lying to her  about meet- up to keep from inviting her. Patient reports that unfortunately both people are her team-members and this led to it being difficult to do work. Patient reports that she started to feel and believe that her team members were talking about her, and this reminded her of when she was gay in HS. Patient reports that she was ostracized in high school, she never came out the closet, but there were a lot of rumors that led to her feeling ashamed despite trying to live everyday and be involved. Patient reports that she overall felt in those moments and at work, like she could not be herself. She felt like she had no one to talk to and trapped. Patient reports that when she had all of these feelings, she began to cry at work and was allowed to leave work early. Patient reports that she then took excessive amount of pills on the way home, feeling that she would be better off dead.  Patient reports she later told her boss about what led to this, and her boss was supportive but patient endorsed that she had felt very alone and reluctant to let her know.   Overall patient endorses feeling persecuted for her beliefs. Patient endorses that she struggles with "self-esteem" and her identity.   Patient reports that she wants to learn how to work through what happened, but she also does not feel like she wants to return to this environment. Patient endorses that she is still working on anxiety and controlling her worries, regarding applying for new jobs and new changes in life. She endorses that she has a lot of dreams about the anxiety of returning back to work, some are negative and some are positive.   Patient denies manic episodes and does not meet critiera for PTSD.  Patient reports that she feels she is doing better with increase in Abilify to '5mg'$ . Patient reports her mood has improved and she is sleeping well. Patient endorses that she is going out and participating in activities like church.   Patient  denies SI, HI, and AVH.     Associated Signs/Symptoms: Depression Symptoms:  anxiety, (Hypo) Manic Symptoms:   none Anxiety Symptoms:  Excessive Worry, Psychotic Symptoms:   none PTSD Symptoms: NA  Past Psychiatric History:  Depression, GAD, and Gender Dysphoria, and 2 Suicide Attempts Via OD (last 01/2023) and 2 Psychiatric Hospitalizations (last- [facility] 01/2023), and no history of Self Injurious Behavior.    Previous Psychotropic Medications: Yes Paxil, Zoloft, Wellbutrin, Xanax, Propanolol, Prozac (pooped out before admission to [facility]), Effexor    Substance Abuse History in the last 12 months:  Yes.   Etoh- 4 glasses of wine/ week THC- Last use was 11/2022, trying to be sober hx of [employer]- no NO other substances  Consequences of Substance Abuse: Medical Consequences:  THC worsened anxiety  Past Medical History:  Past Medical History:  Diagnosis Date   BMI 33.0-33.9,adult  Endometriosis    s/p hysterectomy   GAD (generalized anxiety disorder)    Dr. Annitta [place] psychiatrist   Gender dysphoria 2014   did undergo 10 mo testosterone treatment   GERD (gastroesophageal reflux disease)    HLD (hyperlipidemia) 08/12/2014   Mild off meds    MDD (major depressive disorder)    Dr. Annitta Jersey psychiatrist   PONV (postoperative nausea and vomiting)    after finger surgery    Past Surgical History:  Procedure Laterality Date   CESAREAN SECTION     COLONOSCOPY  2007   WNL [facility])   COLONOSCOPY WITH PROPOFOL N/A 03/31/2021   WNL, rpt 10 yrs Bonna Gains, Witmer B, MD)   ESOPHAGOGASTRODUODENOSCOPY (EGD) WITH PROPOFOL N/A 03/21/2018   barrett's without dysplasia, mild chronic gastritis, reflux gastroesophagitis Bonna Gains, [name], MD)   ESOPHAGOGASTRODUODENOSCOPY (EGD) WITH PROPOFOL N/A 03/31/2021   small HH, shatski ring, ?barretts - biopsies WNL Bonna Gains, [name], MD)   FINGER SURGERY Right 2007   little finger   OOPHORECTOMY Left 2006   TONSILLECTOMY  1975   TOTAL  ABDOMINAL HYSTERECTOMY  12/2012   endometriosis and ovarian cysts s/p ovaries removed   TYMPANOSTOMY TUBE PLACEMENT Bilateral 1976    Family Psychiatric History:  P Uncle- Bipolar Dad- Etoh use dx Multiple Paternal-family members had Etoh use dx P Uncle and 2 P cousin- suicides P cousin-schizophrenia Son- High Functioning Autism Cousin- Low Functioning Autism Family History:  Family History  Problem Relation Age of Onset   Diabetes Mother    Cancer Mother 42       ovarian with mets   Rheum arthritis Mother    Hypertension Father    Alcohol abuse Father        history   Rheum arthritis Maternal Aunt    CAD Maternal Uncle 67       MI   Depression Paternal Uncle    Alcohol abuse Paternal Uncle    Suicidality Paternal Uncle    CAD Maternal Grandfather 79       MI   Depression Cousin    Alcohol abuse Cousin    Suicidality Cousin    Drug abuse Cousin    Suicidality Cousin    Anxiety disorder Son    Autism spectrum disorder Son    Stroke Neg Hx     Social History:   Social History   Socioeconomic History   Marital status: Married    Spouse name: michael   Number of children: 1   Years of education: Not on file   Highest education level: Associate degree: academic program  Occupational History   Not on file  Tobacco Use   Smoking status: Former    Types: Cigarettes    Quit date: 02/06/1993    Years since quitting: 30.0   Smokeless tobacco: Never  Vaping Use   Vaping Use: Never used  Substance and Sexual Activity   Alcohol use: Not Currently    Comment: social/weekends   Drug use: Not Currently    Types: Marijuana    Comment: stopped 2 weeks ago smoking marijuana   Sexual activity: Yes  Other Topics Concern   Not on file  Social History Narrative   Lives with husband Ronalee Belts) and son Remo Lipps)   Occupation: Customer service manager   Edu: Associate's degree   Activity: exercise 3x/wk   Diet: good water, fruits/vegetables some   Social Determinants of Health    Financial Resource Strain: Not on file  Food Insecurity: No Food Insecurity (01/18/2023)   Hunger  Vital Sign    Worried About Charity fundraiser in the Last Year: Never true    Ran Out of Food in the Last Year: Never true  Transportation Needs: No Transportation Needs (01/18/2023)   PRAPARE - Hydrologist (Medical): No    Lack of Transportation (Non-Medical): No  Physical Activity: Not on file  Stress: Not on file  Social Connections: Not on file    Additional Social History:  - Lives with husband and son - has 37 yo son - 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 - active in church - identified as gay in [employer] - now married identifies as female, but uses female pronouns and does not do any HRT  Allergies:  No Known Allergies  Metabolic Disorder Labs: No results found for: "HGBA1C", "MPG" No results found for: "PROLACTIN" Lab Results  Component Value Date   CHOL 236 (H) 01/16/2023   TRIG 142.0 01/16/2023   HDL 58.70 01/16/2023   CHOLHDL 4 01/16/2023   VLDL 28.4 01/16/2023   LDLCALC 149 (H) 01/16/2023   LDLCALC 125 (H) 12/29/2021   Lab Results  Component Value Date   TSH 1.793 12/20/2022    Therapeutic Level Labs: No results found for: "LITHIUM" No results found for: "CBMZ" No results found for: "VALPROATE"  Current Medications: Current Outpatient Medications  Medication Sig Dispense Refill   ARIPiprazole (ABILIFY) 5 MG tablet Take 1 tablet (5 mg total) by mouth daily. 15 tablet 0   omeprazole (PRILOSEC) 40 MG capsule Take 1 capsule (40 mg total) by mouth daily. 30 capsule 0   rosuvastatin (CRESTOR) 10 MG tablet Take 1 tablet (10 mg total) by mouth daily after supper. 90 tablet 3   venlafaxine XR (EFFEXOR XR) 37.5 MG 24 hr capsule Take 1 capsule (37.5 mg total) by mouth daily with breakfast. Take along with 75 mg daily 30 capsule 0   venlafaxine XR (EFFEXOR XR) 75 MG 24 hr capsule Take 1 capsule (75 mg total) by mouth daily with  breakfast. Take along with Effexor XR 37.5 mg -total of 112.5 mg daily 30 capsule 0   No current facility-administered medications for this visit.    Psychiatric Specialty Exam: Review of Systems  Psychiatric/Behavioral:  Negative for dysphoric mood, hallucinations, sleep disturbance and suicidal ideas.     Last menstrual period 07/17/2012.There is no height or weight on file to calculate BMI.  General Appearance: Casual  Eye Contact:  Good  Speech:  Clear and Coherent  Volume:  Normal  Mood:  Euthymic and a bit dysphoric when talking about work  Affect:  Appropriate  Thought Process:  Coherent  Orientation:  Full (Time, Place, and Person)  Thought Content:  Logical  Suicidal Thoughts:  No  Homicidal Thoughts:  No  Memory:  Immediate;   Good Recent;   Good  Judgement:  Other:  Improving  Insight:  Good  Psychomotor Activity:  NA  Concentration:  Concentration: Good  Recall:  NA  Fund of Knowledge:Good  Language: Good  Akathisia:  NA  Handed:    AIMS (if indicated):  not done  Assets:  Communication Skills Desire for Improvement Financial Resources/Insurance Housing Resilience Social Support Transportation  ADL's:  Intact  Cognition: WNL  Sleep:  Good   Screenings: AIMS    Flowsheet Row Admission (Discharged) from 01/18/2023 in [place] Total Score 0      AUDIT    [place] Admission (Discharged) from 01/18/2023  in [place]  Alcohol Use Disorder Identification Test Final Score (AUDIT) 0      GAD-7    Flowsheet Row Counselor from 01/24/2023 in [place] Office Visit from 01/23/2023 in [place] Office Visit from 01/17/2023 in [place] Office Visit from 01/16/2023 in [place] [facility] [place] Visit from 11/30/2022 in [place]  Total GAD-7 Score '13 9 13 14 14      '$ PHQ2-9    Flowsheet Row Counselor from 02/25/2023 in [place] Counselor from 02/01/2023 in [place] Counselor from 01/24/2023 in [place] Office Visit from 01/23/2023 in [place] Office Visit from 01/17/2023 in [place]  PHQ-2 Total Score '3 2 4 5 4  '$ PHQ-9 Total Score '14 8 15 16 17      '$ Flowsheet Row Counselor from 02/25/2023 in [place] Counselor from 02/01/2023 in [place] Counselor from 01/24/2023 in [place] CATEGORY Error: Question 6 not populated Error: Q3, 4, or 5 should not be populated when Q2 is No Error: Q3, 4, or 5 should not be populated when Q2 is No       Assessment and Plan: Based on assessment today, patient appears to progressing well from initial presentation at [facility]. Today, was the first time patient gave details about what triggered her SA and this was growth for patient. Patient appears to have good judgement and decent insight. Patient does appears to have some Cluster B traits, but she has insight into her identity crisis and how it contributes to her depression and anxiety. Patient doing well on current medication regimen, will not make adjustments at this time.   MDD, Recurrent, Severe, w/out Psychosis  GAD:  Medication - Continue Effexor XR 112.'5mg'$ , no refills currently - Continue Abilify '5mg'$  daily, no refills currently   Has an appt at with [facility], who does testing for ADHD.  Collaboration of Care: Therapy team  Patient/Guardian was advised Release of Information must be obtained prior to any record release in order to collaborate their care with an outside provider.  Patient/Guardian was advised if they have not already done so to contact the registration department to sign all necessary forms in order for [name] to release information regarding their care.   Consent: Patient/Guardian gives verbal consent for treatment and assignment of benefits for services provided during this visit. Patient/Guardian expressed understanding and agreed to proceed.    PGY-3 Freida Busman, MD 3/11/202411:27 AM

## 2023-02-25 NOTE — Progress Notes (Signed)
Virtual Visit via Video Note   I connected with Katherine Steele on 02/25/23 at  9:00 AM EDT by a video enabled telemedicine application and verified that I am speaking with the correct person using two identifiers.   At orientation to the IOP program, Case Manager discussed the limitations of evaluation and management by telemedicine and the availability of in person appointments. The patient expressed understanding and agreed to proceed with virtual visits throughout the duration of the program.   Location:  Patient: Patient Home Provider: OPT [place] Office   History of Present Illness: MDD and GAD  Observations/Objective: Check In: Case Manager checked in with all participants to review discharge dates, insurance authorizations, work-related documents and needs from the treatment team regarding medications. Katherine Steele stated needs and engaged in discussion.    Initial Therapeutic Activity: Counselor facilitated a check-in with Katherine Steele to assess for safety, sobriety and medication compliance.  Counselor also inquired about Katherine Steele current emotional ratings, as well as any significant changes in thoughts, feelings or behavior since previous check in.  Katherine Steele presented for session on time and was alert, oriented x5, with no evidence or self-report of active SI/HI or A/V H.  Katherine Steele reported compliance with medication and denied use of alcohol or illicit substances.  Katherine Steele reported scores of 5/10 for depression, 0/10 for anxiety, and 0/10 for anger/irritability.  Katherine Steele denied any recent outbursts or panic attacks.  Katherine Steele reported that a recent success was deciding to continue group treatment by beginning Katherine Steele after completing PHP last week, stating "The medication is helping to manage mood swings".  Katherine Steele reported that a recent struggle was attending church yesterday, stating "That was nice, we had a choir visit from [place]".  She reported that they also grilled a meal for dinner. Katherine Steele reported that her goal today  is to take time to rest since she is feeling tired.         Second Therapeutic Activity: Counselor introduced topic of stress management today.  Counselor provided definition of stress as feeling tense, overwhelmed, worn out, and/or exhausted, and noted that in small amounts, stress can be motivating until things become too overwhelming to manage.  Counselor also explained how stress can be acute (brief but intense) or chronic (long-lasting) and this can impact the severity of symptoms one can experience in the physical, emotional, and behavioral categories.  Counselor inquired about members' specific stressors, how long they have been prevalent, and the various symptoms that tend to manifest as a result.  Counselor also offered several stress management strategies to help improve members' coping ability, including journaling, gratitude practice, relaxation techniques, and time management tips.  Counselor also explained that research has shown a strong support network composed of trusted family, friends, or community members can increase resilience in times of stress, and inquired about who members can reach out to for help in managing stressors.  Counselor encouraged members to consider discussing stressor 'red flags' with their close supports that can be monitored and strategies for assisting them in times of crisis.  Intervention was effective, as evidenced by Katherine Steele actively participating in discussion on subject, reporting that her most significant stressors include planning for retirement, lack of confidence, public speaking, and changing jobs.  Katherine Steele was able to identify several warning signs related to stress, including panic attacks, trouble breathing, numbness, muscle tension, sleep problems, and increased heartrate.  Katherine Steele reported that her stress management goal is to maintain an average stress level of 5/10 or lower for 5 days a  week by consistently taking lunch breaks each day outside of the building,  in addition practicing grounding techniques learned from therapy.  Katherine Steele also expressed receptiveness to several stress management strategies practiced today in session, including practicing a deep breathing exercise, using a stress tracker to monitor stress throughout the workday, and practicing progressive muscle relaxation.   Assessment and Plan: Counselor recommends that Katherine Steele remain in IOP treatment to better manage mental health symptoms, ensure stability and pursue completion of treatment plan goals. Counselor recommends adherence to crisis/safety plan, taking medications as prescribed, and following up with medical professionals if any issues arise.   Follow Up Instructions: Counselor will send Webex link for next session. Katherine Steele was advised to call back or seek an in-person evaluation if the symptoms worsen or if the condition fails to improve as anticipated.   Collaboration of Care:   Medication Management AEB Dr. [name] or Ricky Ala, NP                                          Case Manager AEB Dellia Nims, CNA   Patient/Guardian was advised Release of Information must be obtained prior to any record release in order to collaborate their care with an outside provider. Patient/Guardian was advised if they have not already done so to contact the registration department to sign all necessary forms in order for [name] to release information regarding their care.   Consent: Patient/Guardian gives verbal consent for treatment and assignment of benefits for services provided during this visit. Patient/Guardian expressed understanding and agreed to proceed.  I provided 180 minutes of non-face-to-face time during this encounter.   Shade Flood, [place], LCAS 02/25/23

## 2023-02-26 ENCOUNTER — Other Ambulatory Visit (HOSPITAL_COMMUNITY): Payer: BC Managed Care – PPO | Attending: Psychiatry | Admitting: Psychiatry

## 2023-02-26 ENCOUNTER — Encounter (HOSPITAL_COMMUNITY): Payer: Self-pay

## 2023-02-26 ENCOUNTER — Other Ambulatory Visit: Payer: Self-pay | Admitting: Psychiatry

## 2023-02-26 DIAGNOSIS — F411 Generalized anxiety disorder: Secondary | ICD-10-CM | POA: Insufficient documentation

## 2023-02-26 DIAGNOSIS — F332 Major depressive disorder, recurrent severe without psychotic features: Secondary | ICD-10-CM

## 2023-02-26 DIAGNOSIS — F329 Major depressive disorder, single episode, unspecified: Secondary | ICD-10-CM | POA: Insufficient documentation

## 2023-02-26 DIAGNOSIS — F331 Major depressive disorder, recurrent, moderate: Secondary | ICD-10-CM

## 2023-02-26 NOTE — Progress Notes (Signed)
Virtual Visit via Video Note   I connected with Katherine Steele on 02/26/23 at  9:00 AM EDT by a video enabled telemedicine application and verified that I am speaking with the correct person using two identifiers.   At orientation to the IOP program, Case Manager discussed the limitations of evaluation and management by telemedicine and the availability of in person appointments. The patient expressed understanding and agreed to proceed with virtual visits throughout the duration of the program.   Location:  Patient: Patient Home Provider: [facility] [place]   History of Present Illness: MDD and GAD  Observations/Objective: Check In: Case Manager checked in with all participants to review discharge dates, insurance authorizations, work-related documents and needs from the treatment team regarding medications. Katherine Steele stated needs and engaged in discussion.    Initial Therapeutic Activity: Counselor facilitated a check-in with Katherine Steele to assess for safety, sobriety and medication compliance.  Counselor also inquired about Katherine Steele's current emotional ratings, as well as any significant changes in thoughts, feelings or behavior since previous check in.  Katherine Steele presented for session on time and was alert, oriented x5, with no evidence or self-report of active SI/HI or A/V H.  Katherine Steele reported compliance with medication and denied use of alcohol or illicit substances.  Katherine Steele reported scores of 3/10 for depression, 1/10 for anxiety, and 0/10 for anger/irritability.  Katherine Steele denied any recent outbursts or panic attacks.  Katherine Steele reported that a recent success was having a good day, and spending some of it outside in the nice weather, stating "It was a laid back afternoon".  Katherine Steele denied any new struggles at this time.  Katherine Steele reported that her goal today is to do some cleaning around the home and then take a walk.       Second Therapeutic Activity: Counselor introduced Katherine Steele, Katherine Steele to provide  psychoeducation on topic of Grief and Loss with members today.  Katherine Steele began discussion by checking in with the group about their baseline mood today, general thoughts on what grief means to them and how it has affected them personally in the past.  Katherine Steele provided information on how the process of grief/loss can differ depending upon one's unique culture, and categories of loss one could experience (i.e. loss of a person, animal, relationship, job, identity, etc).  Katherine Steele encouraged members to be mindful of how pervasive loss can be, and how to recognize signs which could indicate that this is having an impact on one's overall mental health and wellbeing.  Intervention was effective, as evidenced by Katherine Steele participating in discussion with speaker on the subject, reporting that she is familiar with the experience of grief due to losing close family members like her parents, as well as losing her job after maintaining a position there for years.  Katherine Steele stated "Losing my job created that same deep sense of loss.  Its like losing part of my identity.  That was a stressful period of time for me".  Katherine Steele reported that planning for future vacations or escapes can provide comfort when faced with grief, including traveling somewhere far away like Katherine Steele, which is somewhere she always wanted to visit.     Third  Therapeutic Activity: Counselor introduced topic of building a social support network today.  Counselor explained how this can be defined as having a having a group of healthy people in one's life you can talk to, spend time with, and get help from to improve both mental and physical health.  Counselor noted that  some barriers can make it difficult to connect with other people, including the presence of anxiety or depression, or moving to an unfamiliar area.  Group members were asked to assess the current state of their support network, and identify ways that this could be improved.  Tips were given on how to address  previously noted barriers, such as strengthening social skills, using relaxation techniques to reduce anxiety, scheduling social time each week, and/or exploring social events nearby which could increase chances of meeting new supports.  Members were also encouraged to consider getting closer to people they already know through suggestions such as outreaching someone by text, email or phone call if they haven't spoken in awhile, doing something nice for a friend/family member unexpectedly, and/or inviting someone over for a game/movie/dinner night.  Intervention was effective, as evidenced by Katherine Steele actively participating in discussion on the subject, and reporting that she would rate her overall support network at present as 'pretty strong'.  [name] reported that some barriers to socialization have included being shy around new people, and experiencing anxious thoughts which make her second guess whether she can talk to others comfortably.  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  [name] reported that this could include going to church, volunteering, or scheduling a movie/dinner night.    Assessment and Plan: Counselor recommends that Katherine Steele remain in IOP treatment to better manage mental health symptoms, ensure stability and pursue completion of treatment plan goals. Counselor recommends adherence to crisis/safety plan, taking medications as prescribed, and following up with medical professionals if any issues arise.   Follow Up Instructions: Counselor will send Webex link for next session. Katherine Steele was advised to call back or seek an in-person evaluation if the symptoms worsen or if the condition fails to improve as anticipated.   Collaboration of Care:    Medication Management AEB Dr. [name] or Ricky Ala, NP                                          Case Manager AEB Katherine Nims, CNA   Patient/Guardian was advised Release of Information must be obtained prior to any record release in order to collaborate their care with an outside provider. Patient/Guardian was advised if they have not already done so to contact the registration department to sign all necessary forms in order for [name] to release information regarding their care.   Consent: Patient/Guardian gives verbal consent for treatment and assignment of benefits for services provided during this visit. Patient/Guardian expressed understanding and agreed to proceed.  I provided 170 minutes of non-face-to-face time during this encounter.   Shade Flood, [place], LCAS 02/26/23

## 2023-02-27 ENCOUNTER — Other Ambulatory Visit (HOSPITAL_COMMUNITY): Payer: BC Managed Care – PPO | Attending: Psychiatry | Admitting: Licensed Clinical Social Worker

## 2023-02-27 DIAGNOSIS — F329 Major depressive disorder, single episode, unspecified: Secondary | ICD-10-CM | POA: Diagnosis not present

## 2023-02-27 DIAGNOSIS — F411 Generalized anxiety disorder: Secondary | ICD-10-CM | POA: Insufficient documentation

## 2023-02-27 DIAGNOSIS — F331 Major depressive disorder, recurrent, moderate: Secondary | ICD-10-CM

## 2023-02-27 NOTE — Progress Notes (Signed)
Virtual Visit via Video Note   I connected with Katherine Steele on 02/27/23 at  9:00 AM EDT by a video enabled telemedicine application and verified that I am speaking with the correct person using two identifiers.   At orientation to the IOP program, Case Manager discussed the limitations of evaluation and management by telemedicine and the availability of in person appointments. The patient expressed understanding and agreed to proceed with virtual visits throughout the duration of the program.   Location:  Patient: Patient Home Provider: [facility]   History of Present Illness: MDD and GAD  Observations/Objective: Check In: Case Manager checked in with all participants to review discharge dates, insurance authorizations, work-related documents and needs from the treatment team regarding medications. Katherine Steele stated needs and engaged in discussion.    Initial Therapeutic Activity: Counselor facilitated a check-in with Katherine Steele to assess for safety, sobriety and medication compliance.  Counselor also inquired about Katherine Steele's current emotional ratings, as well as any significant changes in thoughts, feelings or behavior since previous check in.  Katherine Steele presented for session on time and was alert, oriented x5, with no evidence or self-report of active SI/HI or A/V H.  Katherine Steele reported compliance with medication and denied use of alcohol or illicit substances.  Katherine Steele reported scores of 5/10 for depression, 2/10 for anxiety, and 0/10 for anger/irritability.  Katherine Steele denied any recent outbursts or panic attacks.  Katherine Steele reported that a recent success was spending time outside in nature for self-care yesterday.  Katherine Steele reported that a recent struggle has been noticing issues in her marriage, although she and her partner plan to enter counseling together after [facility].  Katherine Steele reported that her goal today is to take a walk outside since the weather will be warmer.  She reported that she would also consider buying some  flowers, since another peer in group mentioned this as an idea.       Second Therapeutic Activity: Counselor introduced Katherine Steele, Katherine Steele, to provide psychoeducation on topic of medication compliance with members today.  Katherine Steele provided psychoeducation on classes of medications such as antidepressants, antipsychotics, what symptoms they are intended to treat, and any side effects one might encounter while on a particular prescription.  Time was allowed for clients to ask any questions they might have of Katherine Steele regarding this specialty.  Intervention was effective, as evidenced by Katherine Steele participating in discussion with speaker on the subject, reporting that she was on Prozac for years until it stopped working, and recently transitioned to a regimen of Effexor and Abilify.  Katherine Steele reported that this change seems to have made a difference in addressing her symptoms, although she had some concerns about differences between Effexor versus Prozac.  She was receptive to feedback from the Steele on how the medications are intended to treat her condition, in addition to strengths and weaknesses of both.    Third Therapeutic Activity: Counselor discussed topic of gratitude journaling with members as a form of self-care.  Counselor virtually shared a handout with the group today which explained the benefits of this practice, including reduction in stress, increased happiness, and self-esteem.  Tips were also provided to aid in practice, such as taking time with entries, writing about people one is grateful for, and setting goal for two entries per week for at least 10-20 minutes at a time.  Counselor also provided group members with a variety of journaling prompts to choose from today, and encouraged each member to take time to write about something  they are grateful for, with examples such as "Something beautiful I recently saw was..", "Something I can be proud of is.", "A reason to be excited for the future  is." and more.  Members were encouraged to share their entry with the group, along with their perspective on the activity and motivation level towards making this a habit. Intervention was effective, as evidenced by Katherine Steele participating in journaling activity, and choosing the prompts "The best part about today is." and "Someone I can always count on is.".  Katherine Steele expressed gratitude for the warm, clear weather today, stating "Its gorgeous today, and I have the ability to go for a walk in my quiet neighborhood". She also expressed gratitude for her friend Katherine Steele, stating "I can always text her and she will respond.  She validates how I feel without judgement".  Katherine Steele expressed interest in making this a regular self-care habit, reporting that she used to journal regularly when she was in her 20's, and found it useful to look back on entries detailing challenges she overcame, and coping skills utilized.  Katherine Steele stated "I have to discipline myself to do it again".      Assessment and Plan: Counselor recommends that Katherine Steele remain in IOP treatment to better manage mental health symptoms, ensure stability and pursue completion of treatment plan goals. Counselor recommends adherence to crisis/safety plan, taking medications as prescribed, and following up with medical professionals if any issues arise.   Follow Up Instructions: Counselor will send Webex link for next session. Kerrie was advised to call back or seek an in-person evaluation if the symptoms worsen or if the condition fails to improve as anticipated.   Collaboration of Care:   Medication Management AEB Dr. [name] or Ricky Ala, NP                                          Case Manager AEB Dellia Nims, CNA   Patient/Guardian was advised Release of Information must be obtained prior to any record release in order to collaborate their care with an outside provider. Patient/Guardian was advised if they have not already done so to contact the  registration department to sign all necessary forms in order for [name] to release information regarding their care.   Consent: Patient/Guardian gives verbal consent for treatment and assignment of benefits for services provided during this visit. Patient/Guardian expressed understanding and agreed to proceed.  I provided 180 minutes of non-face-to-face time during this encounter.   Shade Flood, [place], LCAS 02/27/23

## 2023-02-28 ENCOUNTER — Other Ambulatory Visit (HOSPITAL_COMMUNITY): Payer: BC Managed Care – PPO | Attending: Licensed Clinical Social Worker | Admitting: Licensed Clinical Social Worker

## 2023-02-28 DIAGNOSIS — F331 Major depressive disorder, recurrent, moderate: Secondary | ICD-10-CM

## 2023-02-28 DIAGNOSIS — Z79899 Other long term (current) drug therapy: Secondary | ICD-10-CM | POA: Insufficient documentation

## 2023-02-28 DIAGNOSIS — F332 Major depressive disorder, recurrent severe without psychotic features: Secondary | ICD-10-CM | POA: Diagnosis not present

## 2023-02-28 DIAGNOSIS — F411 Generalized anxiety disorder: Secondary | ICD-10-CM | POA: Insufficient documentation

## 2023-02-28 NOTE — Progress Notes (Signed)
Virtual Visit via Video Note   I connected with [name] on 02/28/23 at  9:00 AM EDT by a video enabled telemedicine application and verified that I am speaking with the correct person using two identifiers.   At orientation to the IOP program, Case Manager discussed the limitations of evaluation and management by telemedicine and the availability of in person appointments. The patient expressed understanding and agreed to proceed with virtual visits throughout the duration of the program.   Location:  Patient: Patient Home Provider: [facility] [place] [facility]   History of Present Illness: MDD and GAD  Observations/Objective: Check In: Case Manager checked in with all participants to review discharge dates, insurance authorizations, work-related documents and needs from the treatment team regarding medications. Raheema stated needs and engaged in discussion.    Initial Therapeutic Activity: Counselor facilitated a check-in with Willodeen to assess for safety, sobriety and medication compliance.  Counselor also inquired about Kaedence's current emotional ratings, as well as any significant changes in thoughts, feelings or behavior since previous check in.  Danyale presented for session on time and was alert, oriented x5, with no evidence or self-report of active SI/HI or A/V H.  Selima reported compliance with medication and denied use of alcohol or illicit substances.  Maryella reported scores of 5/10 for depression, 4/10 for anxiety, and 0/10 for anger/irritability.  Prerna denied any recent outbursts or panic attacks.  Jesyca reported that a recent success was taking a walk after group yesterday, stating "It was beautiful.  I did some work in the yard too".  Concepcion reported that a recent struggle has been trying to mediate between her son and husband, who have been having disagreements more often.  Leea reported that her goal today is to take a walk, and then cook a chicken pot pie for dinner.       Second Therapeutic  Activity: Counselor introduced topic of self-care today.  Counselor explained how this can be defined as the things one does to maintain good health and improve well-being.  Counselor provided members with a self-care assessment form to complete.  This handout featured various sub-categories of self-care, including physical, psychological/emotional, social, spiritual, and professional.  Members were asked to rank their engagement in the activities listed for each dimension on a scale of 1-3, with 1 indicating 'Poor', 2 indicating 'Liberty', and 3 indicating 'Well'.  Counselor invited members to share results of their assessment, and inquired about which areas of self-care they are doing well in, as well as areas that require attention, and how they plan to begin addressing this during treatment.  Intervention was effective, as evidenced by [name] successfully completing initial 2 sections of assessment and actively engaging in discussion on subject, reporting that she is excelling in areas such as taking care of personal hygiene, eating regularly, going to preventative medical appointments, taking time off of work, expressing feelings in a healthy way, recognizing strengths/achievements, and doing comforting activities, but would benefit from focusing more on areas such as exercising regularly, eating healthier food, going on vacations or daytrips, participating in hobbies, and learning new things unrelated to work.  Rorey reported that she would work to improve self-care deficits by taking walks more frequently through the week to increase exercise, cutting sugary and fatty foods out of diet, making plans to take a daytrip to a winery with her husband soon, getting back into sports she used to enjoy if they aren't too physically demanding, or exploring new hobbies such as sudoku, 3D  printing, or beekeeping.  Benisha stated "I'm learning that its okay to put yourself first more often".    Assessment and Plan: Counselor  recommends that Nini remain in IOP treatment to better manage mental health symptoms, ensure stability and pursue completion of treatment plan goals. Counselor recommends adherence to crisis/safety plan, taking medications as prescribed, and following up with medical professionals if any issues arise.   Follow Up Instructions: Counselor will send Webex link for next session. Yareny was advised to call back or seek an in-person evaluation if the symptoms worsen or if the condition fails to improve as anticipated.   Collaboration of Care:   Medication Management AEB Dr. [name] or Ricky Ala, NP                                          Case Manager AEB Dellia Nims, CNA   Patient/Guardian was advised Release of Information must be obtained prior to any record release in order to collaborate their care with an outside provider. Patient/Guardian was advised if they have not already done so to contact the registration department to sign all necessary forms in order for [name] to release information regarding their care.   Consent: Patient/Guardian gives verbal consent for treatment and assignment of benefits for services provided during this visit. Patient/Guardian expressed understanding and agreed to proceed.  I provided 180 minutes of non-face-to-face time during this encounter.   Shade Flood, [place], LCAS 02/28/23

## 2023-03-01 ENCOUNTER — Other Ambulatory Visit (HOSPITAL_COMMUNITY): Payer: BC Managed Care – PPO | Attending: Psychiatry | Admitting: Licensed Clinical Social Worker

## 2023-03-01 DIAGNOSIS — F332 Major depressive disorder, recurrent severe without psychotic features: Secondary | ICD-10-CM | POA: Diagnosis not present

## 2023-03-01 DIAGNOSIS — F411 Generalized anxiety disorder: Secondary | ICD-10-CM | POA: Insufficient documentation

## 2023-03-01 DIAGNOSIS — F331 Major depressive disorder, recurrent, moderate: Secondary | ICD-10-CM

## 2023-03-01 NOTE — Progress Notes (Signed)
Virtual Visit via Video Note   I connected with [name] on 03/01/23 at  9:00 AM EDT by a video enabled telemedicine application and verified that I am speaking with the correct person using two identifiers.   At orientation to the IOP program, Case Manager discussed the limitations of evaluation and management by telemedicine and the availability of in person appointments. The patient expressed understanding and agreed to proceed with virtual visits throughout the duration of the program.   Location:  Patient: Patient Home Provider: [facility]   History of Present Illness: MDD and GAD  Observations/Objective: Check In: Case Manager checked in with all participants to review discharge dates, insurance authorizations, work-related documents and needs from the treatment team regarding medications. Ashlee stated needs and engaged in discussion.    Initial Therapeutic Activity: Counselor facilitated a check-in with Thetis to assess for safety, sobriety and medication compliance.  Counselor also inquired about Kalicia's current emotional ratings, as well as any significant changes in thoughts, feelings or behavior since previous check in.  Fritzi presented for session on time and was alert, oriented x5, with no evidence or self-report of active SI/HI or A/V H.  Ragnhild reported compliance with medication and denied use of alcohol or illicit substances.  Ivon reported scores of 3/10 for depression, 1/10 for anxiety, and 0/10 for anger/irritability.  Rocklynn denied any recent outbursts or panic attacks.  Aamira reported that a recent success was taking a walk yesterday and cooked a chicken pie last night for the family.  Tialisa denied any new struggles.  Sorrel reported that her goal this weekend is to do some cleaning around the home and attend church on Sunday.         Second Therapeutic Activity: Counselor utilized a Radio broadcast assistant with group members today to guide discussion on topic of  codependency.  This handout defined codependency as excessive emotional or psychological reliance upon someone who requires support on account of an illness or addiction.  It also explained how this issue presents in dysfunctional family systems, including behavior such as denying existence of problems, rigid boundaries on communication, strained trust, lack of individuality, and reinforcement of unhealthy coping mechanisms such as substance use.  Characteristics of co-dependent people were listed for assistance with identification, such as extreme need for approval/recognition, difficulty identifying feelings, poor communication, and more.  Members were also tasked with completing a questionnaire in order to identify signs of codependency and results were discussed afterward.  This handout also offered strategies for resolving co-dependency within one's network, including increased use of assertive communication skills in order to set appropriate boundaries.  Intervention was effective, as evidenced by [name] actively participating in discussion on the subject, and completing codependency questionnaire, with 9 out of 20 positive responses.  Dejiah reported that she did grow up with a dysfunctional family background due to having a father that was a 'workaholic' and never available, while her mother took care of everything around the home, and sacrificed her own personal needs.  Ute reported that some of her own codependent traits include fear of abandonment, putting others needs before her own, using marijuana in the past to cope with difficult feelings related to relationship stress, sense of guilt when asserting herself, and difficulty making decisions.  Ilham stated "This is pretty intense for me.  I didn't realize it was dysfunctional at the time because my family didn't talk about these things".  She reported that her goal will be to work on increasing  overall self-esteem through therapy, and develop a more  assertive communication style in order to set healthier boundaries with family, stating "I realize now that I can fix this".      Assessment and Plan: Counselor recommends that Darrie remain in IOP treatment to better manage mental health symptoms, ensure stability and pursue completion of treatment plan goals. Counselor recommends adherence to crisis/safety plan, taking medications as prescribed, and following up with medical professionals if any issues arise.   Follow Up Instructions: Counselor will send Webex link for next session. Tuwanda was advised to call back or seek an in-person evaluation if the symptoms worsen or if the condition fails to improve as anticipated.   Collaboration of Care:   Medication Management AEB Dr. [name] or Ricky Ala, NP                                          Case Manager AEB Dellia Nims, CNA   Patient/Guardian was advised Release of Information must be obtained prior to any record release in order to collaborate their care with an outside provider. Patient/Guardian was advised if they have not already done so to contact the registration department to sign all necessary forms in order for [name] to release information regarding their care.   Consent: Patient/Guardian gives verbal consent for treatment and assignment of benefits for services provided during this visit. Patient/Guardian expressed understanding and agreed to proceed.  I provided 180 minutes of non-face-to-face time during this encounter.   Shade Flood, [place], LCAS 03/01/23

## 2023-03-04 ENCOUNTER — Telehealth (HOSPITAL_COMMUNITY): Payer: Self-pay | Admitting: Psychiatry

## 2023-03-04 ENCOUNTER — Ambulatory Visit (HOSPITAL_COMMUNITY): Payer: BC Managed Care – PPO | Admitting: Psychiatry

## 2023-03-05 ENCOUNTER — Ambulatory Visit: Payer: BC Managed Care – PPO | Admitting: Psychiatry

## 2023-03-05 ENCOUNTER — Other Ambulatory Visit (HOSPITAL_COMMUNITY): Payer: BC Managed Care – PPO | Attending: Psychiatry | Admitting: Licensed Clinical Social Worker

## 2023-03-05 DIAGNOSIS — F332 Major depressive disorder, recurrent severe without psychotic features: Secondary | ICD-10-CM | POA: Diagnosis not present

## 2023-03-05 DIAGNOSIS — F331 Major depressive disorder, recurrent, moderate: Secondary | ICD-10-CM

## 2023-03-05 DIAGNOSIS — F411 Generalized anxiety disorder: Secondary | ICD-10-CM | POA: Diagnosis not present

## 2023-03-05 NOTE — Progress Notes (Signed)
Virtual Visit via Video Note   I connected with Katherine Steele on 03/05/23 at  9:00 AM EDT by a video enabled telemedicine application and verified that I am speaking with the correct person using two identifiers.   At orientation to the IOP program, Case Manager discussed the limitations of evaluation and management by telemedicine and the availability of in person appointments. The patient expressed understanding and agreed to proceed with virtual visits throughout the duration of the program.   Location:  Patient: Patient Home Provider: [facility] [facility]   History of Present Illness: MDD and GAD  Observations/Objective: Check In: Case Manager checked in with all participants to review discharge dates, insurance authorizations, work-related documents and needs from the treatment team regarding medications. Katherine Steele stated needs and engaged in discussion.    Initial Therapeutic Activity: Counselor facilitated a check-in with Katherine Steele to assess for safety, sobriety and medication compliance.  Counselor also inquired about Katherine Steele's current emotional ratings, as well as any significant changes in thoughts, feelings or behavior since previous check in.  Katherine Steele presented for session on time and was alert, oriented x5, with no evidence or self-report of active SI/HI or A/V H.  Katherine Steele reported compliance with medication and denied use of alcohol or illicit substances.  Katherine Steele reported scores of 6/10 for depression, 3/10 for anxiety, and 0/10 for anger/irritability.  Katherine Steele denied any recent outbursts or panic attacks.  Katherine Steele reported that a recent success has been putting in job applications.  Katherine Steele reported that a recent struggle was experiencing stomach discomfort yesterday, which required her to miss group in order to rest.  Katherine Steele reported that her goal today is to look into scheduling a massage for self-care.    Second Therapeutic Activity: Counselor introduced [employer], [place] Chaplain to provide  psychoeducation on topic of Grief and Loss with members today.  Katherine Steele began discussion by checking in with the group about their baseline mood today, general thoughts on what grief means to them and how it has affected them personally in the past.  Katherine Steele provided information on how the process of grief/loss can differ depending upon one's unique culture, and categories of loss one could experience (i.e. loss of a person, animal, relationship, job, identity, etc).  Katherine Steele encouraged members to be mindful of how pervasive loss can be, and how to recognize signs which could indicate that this is having an impact on one's overall mental health and wellbeing.  Intervention was effective, as evidenced by Katherine Steele participating in discussion with speaker on the subject, reporting that recently she has lost relationships with some coworkers at her job, which has led to a grieving process.  She reported that something which brings her joy and helps her cope with loss is the arrival of the spring season, since she usually buys new plants to preoccupy her time, and this brings her comfort.    Third Therapeutic Activity: Counselor discussed topic of distress tolerance today with group.  Counselor shared virtual handout with members that offered a DBT approach represented by the acronym ACCEPTS, and outlined strategies for distracting oneself from distressing emotions, allowing appropriate time for these emotions to lesson in intensity and eventually fade away.  Strategies offered included engaging in positive activities, contributing to the wellbeing of others, comparing one's present situation to a previously difficult one to highlight resilience, using mental imagery, and physical grounding.  Counselor assisted members in creating their own realistic ACCEPTS plan for tackling a distressing emotion of their choice.  Intervention  was effective, as evidenced by Katherine Steele participating in creation of the plan, choosing humiliated as  her emotion of focus, and identifying several helpful approaches for distraction, such as taking a walk in the park, working in the garden, volunteering at her church, sending someone she cares about a card, reflecting upon challenges that she has overcome in the past to highlight resilience, listening to rock music that energizes her, visualizing herself putting difficult thoughts/feelings in a box that is locked up, and hugging her cat.   Assessment and Plan: Counselor recommends that Katherine Steele remain in IOP treatment to better manage mental health symptoms, ensure stability and pursue completion of treatment plan goals. Counselor recommends adherence to crisis/safety plan, taking medications as prescribed, and following up with medical professionals if any issues arise.   Follow Up Instructions: Counselor will send Webex link for next session. Katherine Steele was advised to call back or seek an in-person evaluation if the symptoms worsen or if the condition fails to improve as anticipated.   Collaboration of Care:   Medication Management AEB Dr. [name] or Ricky Ala, NP                                          Case Manager AEB Dellia Nims, CNA   Patient/Guardian was advised Release of Information must be obtained prior to any record release in order to collaborate their care with an outside provider. Patient/Guardian was advised if they have not already done so to contact the registration department to sign all necessary forms in order for [name] to release information regarding their care.   Consent: Patient/Guardian gives verbal consent for treatment and assignment of benefits for services provided during this visit. Patient/Guardian expressed understanding and agreed to proceed.  I provided 180 minutes of non-face-to-face time during this encounter.   Shade Flood, [place], LCAS 03/05/23

## 2023-03-06 ENCOUNTER — Other Ambulatory Visit (HOSPITAL_COMMUNITY): Payer: BC Managed Care – PPO | Attending: Psychiatry | Admitting: Licensed Clinical Social Worker

## 2023-03-06 DIAGNOSIS — F411 Generalized anxiety disorder: Secondary | ICD-10-CM | POA: Diagnosis not present

## 2023-03-06 DIAGNOSIS — F331 Major depressive disorder, recurrent, moderate: Secondary | ICD-10-CM

## 2023-03-06 DIAGNOSIS — F329 Major depressive disorder, single episode, unspecified: Secondary | ICD-10-CM | POA: Insufficient documentation

## 2023-03-06 NOTE — Progress Notes (Signed)
Virtual Visit via Video Note   I connected with Katherine Steele on 03/06/23 at  9:00 AM EDT by a video enabled telemedicine application and verified that I am speaking with the correct person using two identifiers.   At orientation to the IOP program, Case Manager discussed the limitations of evaluation and management by telemedicine and the availability of in person appointments. The patient expressed understanding and agreed to proceed with virtual visits throughout the duration of the program.   Location:  Patient: Patient Home Provider: [facility] [facility]   History of Present Illness: MDD and GAD  Observations/Objective: Check In: Case Manager checked in with all participants to review discharge dates, insurance authorizations, work-related documents and needs from the treatment team regarding medications. Katherine Steele stated needs and engaged in discussion.    Initial Therapeutic Activity: Counselor facilitated a check-in with Katherine Steele to assess for safety, sobriety and medication compliance.  Counselor also inquired about Katherine Steele's current emotional ratings, as well as any significant changes in thoughts, feelings or behavior since previous check in.  Katherine Steele presented for session on time and was alert, oriented x5, with no evidence or self-report of active SI/HI or A/V H.  Katherine Steele reported compliance with medication and denied use of alcohol or illicit substances.  Katherine Steele reported scores of 4/10 for depression, 1/10 for anxiety, and 0/10 for anger/irritability.  Katherine Steele denied any recent outbursts or panic attacks.  Katherine Steele reported that a recent success was taking a walk yesterday after group for exercise.  Katherine Steele denied any new struggles at this time.  Katherine Steele reported that her goal today is to eat out with a friend, stating "I'm looking forward to it".     Second Therapeutic Activity: Counselor invited members to participate in peaceful place guided imagery activity today.  Counselor explained how this is a powerful  visualization tool which can aid in reducing stress while increasing sense of calm, control, and awareness if practiced regularly.  Counselor informed members beforehand that if they became uncomfortable at any point during activity, they could stop and open their eyes.  Counselor invited members to get comfortable, achieve a relaxing breathing rhythm, close their eyes, and then guided them through process of creating a 'peaceful place' which filled them with safety and calm.  Counselor encouraged members to include sensory details involving vision, sound, touch, smell, and taste which they considered pleasant to enhance experience.  After 10 minutes of practice in session, counselor invited members to share their opinion on the activity, including whether they were able to imagine a specific place, what details stood out to them, and how this made them feel during and after.  Intervention was effective, as evidenced by Katherine Steele successfully participating in activity, and reporting that she initially found it difficult to focus on one particular space, but eventually found one that was relaxing to her.  She stated "I really enjoyed it.  Its funny, I tried the beach at first, but then ended up at the pool we used to have out back.  It was a warm summer night.  I floated across it, and felt the water on my skin.  It was warm like bathwater.  I felt really good".  She expressed interest in practicing this one her own, stating "I would practice it again.  It felt safe".    Third Therapeutic Activity: Psycho-educational portion of group was co-facilitated by wellness director (Frederich Balding, MS, MPH, CHES) focused on self-care in daily life. Facilitator and group members discussed presented materials  regarding importance of sleep, diet, and exercise. Group members discussed any changes they are willing to make to improve an area of self-care in their lives (physical, psychological, emotional, spiritual, relationship,  professional) to improve overall mental health as they continue with treatment.  Intervention was effective, as evidenced by Katherine Steele participating in discussion with speaker on the subject, reporting that she is trying to improve overall wellness during [place] by exercising more often.  She reported that she used to be very active, and rode a bike for miles, but plans to start slow by taking walks each day down the street as she build motivation and stamina.    Assessment and Plan: Counselor recommends that Katherine Steele remain in IOP treatment to better manage mental health symptoms, ensure stability and pursue completion of treatment plan goals. Counselor recommends adherence to crisis/safety plan, taking medications as prescribed, and following up with medical professionals if any issues arise.   Follow Up Instructions: Counselor will send Webex link for next session. Katherine Steele was advised to call back or seek an in-person evaluation if the symptoms worsen or if the condition fails to improve as anticipated.   Collaboration of Care:   Medication Management AEB Dr. [name] or Ricky Ala, NP                                          Case Manager AEB Dellia Nims, CNA   Patient/Guardian was advised Release of Information must be obtained prior to any record release in order to collaborate their care with an outside provider. Patient/Guardian was advised if they have not already done so to contact the registration department to sign all necessary forms in order for [name] to release information regarding their care.   Consent: Patient/Guardian gives verbal consent for treatment and assignment of benefits for services provided during this visit. Patient/Guardian expressed understanding and agreed to proceed.  I provided 180 minutes of non-face-to-face time during this encounter.   Shade Flood, LCSW, LCAS 03/06/23

## 2023-03-07 ENCOUNTER — Other Ambulatory Visit (HOSPITAL_COMMUNITY): Payer: BC Managed Care – PPO | Attending: Psychiatry | Admitting: Psychiatry

## 2023-03-07 DIAGNOSIS — F329 Major depressive disorder, single episode, unspecified: Secondary | ICD-10-CM | POA: Insufficient documentation

## 2023-03-07 DIAGNOSIS — F411 Generalized anxiety disorder: Secondary | ICD-10-CM | POA: Diagnosis not present

## 2023-03-07 DIAGNOSIS — F332 Major depressive disorder, recurrent severe without psychotic features: Secondary | ICD-10-CM

## 2023-03-07 MED ORDER — ARIPIPRAZOLE 5 MG PO TABS: 5.0000 mg | ORAL_TABLET | Freq: Every day | ORAL | 0 refills | Status: DC

## 2023-03-07 MED ORDER — VENLAFAXINE HCL ER 75 MG PO CP24: 75.0000 mg | ORAL_CAPSULE | Freq: Every day | ORAL | 0 refills | Status: DC

## 2023-03-07 MED ORDER — VENLAFAXINE HCL ER 37.5 MG PO CP24: 37.5000 mg | ORAL_CAPSULE | Freq: Every day | ORAL | 0 refills | Status: DC

## 2023-03-07 NOTE — Progress Notes (Signed)
Virtual Visit via Video Note   I connected with Katherine Steele on 03/07/23 at  9:00 AM EDT by a video enabled telemedicine application and verified that I am speaking with the correct person using two identifiers.   At orientation to the IOP program, Case Manager discussed the limitations of evaluation and management by telemedicine and the availability of in person appointments. The patient expressed understanding and agreed to proceed with virtual visits throughout the duration of the program.   Location:  Patient: Patient Home Provider: [facility] [place] [facility]   History of Present Illness: MDD and GAD  Observations/Objective: Check In: Case Manager checked in with all participants to review discharge dates, insurance authorizations, work-related documents and needs from the treatment team regarding medications. Katherine Steele stated needs and engaged in discussion.    Initial Therapeutic Activity: Counselor facilitated a check-in with Katherine Steele to assess for safety, sobriety and medication compliance.  Counselor also inquired about Katherine Steele's current emotional ratings, as well as any significant changes in thoughts, feelings or behavior since previous check in.  Katherine Steele presented for session on time and was alert, oriented x5, with no evidence or self-report of active SI/HI or A/V H.  Katherine Steele reported compliance with medication and denied use of alcohol or illicit substances.  Katherine Steele reported scores of 4/10 for depression, 1/10 for anxiety, and 0/10 for anger/irritability.  Katherine Steele denied any recent outbursts or panic attacks.  Katherine Steele reported that a recent success was spending a substantial amount of time sitting outside yesterday due to the warm weather.  She reported that she was also able to go out to lunch with a friend and catch up.  Katherine Steele denied any new struggles.  Katherine Steele reported that her goal today is to take a walk outside after group and focus on activities that relax her.       Second Therapeutic Activity: Counselor  introduced topic of anger management today.  Counselor virtually shared a handout with members on this subject featuring a variety of coping skills, and facilitated discussion on these approaches.  Examples included raising awareness of anger triggers, practicing deep breathing, keeping an anger log to better understand episodes, using diversion activities to distract oneself for 30 minutes, taking a time out when necessary, and being mindful of warning signs tied to thoughts or behavior.  Counselor inquired about which techniques group members have used before, what has proved to be helpful, what their unique warning signs might be, as well as what they will try out in the future to assist with de-escalation.  Intervention was effective, as evidenced by Katherine Steele participating in discussion on activity, and reporting that she doesn't deal with anger often, but when it does arise, she tends to be avoidant, and repress these feelings.  Katherine Steele reported that anger had been most prevalent in the workplace due to toxic coworkers, stating "I have felt really isolated and angry, and just took it inward on myself".  Katherine Steele reported that her triggers include feeling treated unfairly, embarrassed, helpless, out of control, failing at something, being lied to, having someone insult her, be rude, or inconsiderate.  Katherine Steele reported that warning signs include flushed skin, having a loud voice, racing heartbeat, tense muscles, sweaty palms and headaches.  Katherine Steele reported that she will work to manage anger more effectively by building greater self-esteem through therapy, and using coping skills such as an anger journal to process stressful events in a healthy way, avoiding reliance upon alcohol to repress difficult feelings, exploring meditation or deep breathing as  a way to calm down when angry, or using exercise as a healthy outlet to release energy when triggered.  Assessment and Plan: Counselor recommends that Katherine Steele remain in IOP  treatment to better manage mental health symptoms, ensure stability and pursue completion of treatment plan goals. Counselor recommends adherence to crisis/safety plan, taking medications as prescribed, and following up with medical professionals if any issues arise.   Follow Up Instructions: Counselor will send Webex link for next session. Katherine Steele was advised to call back or seek an in-person evaluation if the symptoms worsen or if the condition fails to improve as anticipated.   Collaboration of Care:   Medication Management AEB Dr. [name] or Ricky Ala, NP                                          Case Manager AEB Dellia Nims, CNA   Patient/Guardian was advised Release of Information must be obtained prior to any record release in order to collaborate their care with an outside provider. Patient/Guardian was advised if they have not already done so to contact the registration department to sign all necessary forms in order for [name] to release information regarding their care.   Consent: Patient/Guardian gives verbal consent for treatment and assignment of benefits for services provided during this visit. Patient/Guardian expressed understanding and agreed to proceed.  I provided 175 minutes of non-face-to-face time during this encounter.   Shade Flood, [place], LCAS 03/07/23

## 2023-03-07 NOTE — Patient Instructions (Signed)
D:  Patient is scheduled to complete MH-IOP on 03-15-23.  A:  Follow up with Dr. Shea Evans (to be scheduled) and Gillian Shields (therapist) on 3-38-24 @ 5 pm.  Encouraged support groups.  R:  Patient receptive.

## 2023-03-08 ENCOUNTER — Other Ambulatory Visit (HOSPITAL_COMMUNITY): Payer: BC Managed Care – PPO | Attending: Licensed Clinical Social Worker | Admitting: Psychiatry

## 2023-03-08 DIAGNOSIS — F332 Major depressive disorder, recurrent severe without psychotic features: Secondary | ICD-10-CM | POA: Diagnosis not present

## 2023-03-08 DIAGNOSIS — F411 Generalized anxiety disorder: Secondary | ICD-10-CM | POA: Insufficient documentation

## 2023-03-08 NOTE — Progress Notes (Signed)
Virtual Visit via Video Note  I connected with Katherine Steele on @TODAY @ at  9:00 AM EDT by a video enabled telemedicine application and verified that I am speaking with the correct person using two identifiers.  Location: Patient:  at home Provider: at home office   I discussed the limitations of evaluation and management by telemedicine and the availability of in person appointments. The patient expressed understanding and agreed to proceed.  I discussed the assessment and treatment plan with the patient. The patient was provided an opportunity to ask questions and all were answered. The patient agreed with the plan and demonstrated an understanding of the instructions.   The patient was advised to call back or seek an in-person evaluation if the symptoms worsen or if the condition fails to improve as anticipated.  I provided 30 minutes of non-face-to-face time during this encounter.   Dellia Nims, M.Ed,CNA   Patient ID: Katherine Steele, female   DOB: 11-May-1971, 52 y.o.   MRN: [id number] As per previous [facility] note states:  "Katherine Steele is a 52yo female referred to [facility] by Dr. Shea Evans after a brief hospitalization following a suicide attempt via overdose. She cites her stressors as people in general, finances, and her identity as a trans female who chooses not to transition or use female pronouns. She states she struggles with reconciling her identity with her strong [place] faith. She reports decreased ADLs in taking care of household chores, but states they are improving. She reports she previously saw a therapist at [facility] in [place] but stopped because she is searching for a Marketing executive. She endorses two psychiatric hospitalizations, one at age [age] and one a week ago, both of which followed a suicide attempt by intentional overdose. She reports she takes Prozac and Effexor and is diagnosed with GAD and MDD. She denies SI at this time. She denies HI, AVH, recent substance use and  history of substance abuse, medical diagnoses, and NSSI. She reports family history of alcohol abuse, depression, and three completed suicides, all on her father's side. She cites her husband and 21yo son as her supports, both of whom she lives with. She reports she previously smoked marijuana but stopped 2-3 months ago due to increased paranoia and obsessive thoughts with use. She states there is a firearm in the home that her husband has secured."   Patient stepped down from [facility] on 02-25-23 and started MH-IOP.  Reports continued concentration issues.  Scored on PHQ-9=14. States that the PHP groups were very helpful; hopes to learn further coping skills.  Denies SI/HI or A/V hallucinations. Pt will discharge on 03-15-23.  Reports overall mood improved.  States she is anxious about her RTW date of 03-19-23.  On a scale of 1-10 (10 being the worst); pt rates her anxiety at a 3 and depression between a 3-4.  Denies SI/HI or A/V hallucinations.  "The groups have been very helpful.  I am learning a lot of tools." A:  D/C pt on 03-15-23 as scheduled.   Pt will be excused on 03-14-23 d/t an appt with her therapist Katherine Steele, [place]).  Pt was advised of ROI must be obtained prior to any records release in order to collaborate her care with an outside provider.  Pt was advised if she has not already done so to contact the front desk to sign all necessary forms in order for MH-IOP to release info re: her care.  Consent:  Pt gives verbal consent for tx and assignment  of benefits for services provided during this telehealth group process.  Pt expressed understanding and agreed to proceed. Collaboration of care:  Collaborate with Dr. Damita Steele AEB and Katherine Flood, LCSW AEB, Dr. Lyndel Steele and Katherine Shields, LCSW AEB. Encouraged support groups through [facility].  RTW on 03-19-23, unless pt is able to obtain an extension. R:  Pt receptive.     Dellia Nims, M.Ed,CNA

## 2023-03-08 NOTE — Patient Instructions (Signed)
D:  Patient will be discharged on 03-15-23.  A:  Follow up with Dr. Shea Evans on 04-03-23 @ 4pm and Gillian Shields, LCSW on 03-14-23 @ 5pm.  Recommended support groups through [facility].  RTW on 03-19-23, unless patient is able to obtain an extension.  R:  Patient receptive.

## 2023-03-08 NOTE — Progress Notes (Signed)
Virtual Visit via Video Note   I connected with Katherine Steele on 03/08/23 at  9:00 AM EDT by a video enabled telemedicine application and verified that I am speaking with the correct person using two identifiers.   At orientation to the IOP program, Case Manager discussed the limitations of evaluation and management by telemedicine and the availability of in person appointments. The patient expressed understanding and agreed to proceed with virtual visits throughout the duration of the program.   Location:  Patient: Patient Home Provider: [facility] [place] [facility]   History of Present Illness: MDD and GAD  Observations/Objective: Check In: Case Manager checked in with all participants to review discharge dates, insurance authorizations, work-related documents and needs from the treatment team regarding medications. Katherine Steele stated needs and engaged in discussion.    Initial Therapeutic Activity: Counselor facilitated a check-in with Katherine Steele to assess for safety, sobriety and medication compliance.  Counselor also inquired about Katherine Steele's current emotional ratings, as well as any significant changes in thoughts, feelings or behavior since previous check in.  Katherine Steele presented for session on time and was alert, oriented x5, with no evidence or self-report of active SI/HI or A/V H.  Katherine Steele reported compliance with medication and denied use of alcohol or illicit substances.  Katherine Steele reported scores of 2/10 for depression, 0/10 for anxiety, and 0/10 for anger/irritability.  Katherine Steele denied any recent outbursts or panic attacks.  Katherine Steele reported that a recent success was taking time yesterday to take a walk, relax and watch a basketball game.  Katherine Steele denied any new struggles at this time.  Katherine Steele reported that her goal this weekend is to go visit some family out of town and catch up.       Second Therapeutic Activity: Counselor introduced topic of assertive communication today.  Counselor shared various handouts with members  virtually in group to read along with on the subject.  These handouts defined assertive communication as a communication style in which a person stands up for their own needs and wants, while also taking into consideration the needs and wants of others, without behaving in a passive or aggressive way.  Traits of assertive communicators were highlighted such as using appropriate speaking volume, maintaining eye contact, using confident language, and avoiding interruption.  Members were also provided with tips on how to improve communication, including respecting oneself, expressing thoughts and feelings calmly, and saying "No" when necessary.  Members were given a variety of scenarios where they could practice using these tips to respond in an assertive manner.  Intervention was effective, as evidenced by Katherine Steele participating in discussion on topic, reporting that she has a passive/aggressive communication style due to traits such as prioritizing needs of others before her own, expressing her own needs/wants, not listening to what others say, and becoming easily frustrated.  Katherine Steele reported that this has led to issues such as doing more than her share in the workplace as a result of coworkers taking advantage of her kindness.  Katherine Steele showed more effective use of assertive communication skills through engagement in roleplay activities.    Third Therapeutic Activity: Psycho-educational portion of group was provided by Katherine Steele, Mudlogger of community education with [employer].  Katherine Steele provided information on history of her local agency, mission statement, and the variety of unique services offered which group members might find beneficial to engage in, including both virtual and in-person support groups, as well as peer support program for mentoring.  Katherine Steele offered time to answer member's questions regarding services  and encouraged them to consider utilizing these services to assist in working  towards their individual wellness goals.  Intervention effectiveness could not be measured, as Katherine Steele did not participate.    Assessment and Plan: Counselor recommends that Katherine Steele remain in IOP treatment to better manage mental health symptoms, ensure stability and pursue completion of treatment plan goals. Counselor recommends adherence to crisis/safety plan, taking medications as prescribed, and following up with medical professionals if any issues arise.   Follow Up Instructions: Counselor will send Webex link for next session. Katherine Steele was advised to call back or seek an in-person evaluation if the symptoms worsen or if the condition fails to improve as anticipated.   Collaboration of Care:   Medication Management AEB Dr. [name] or Ricky Ala, NP                                          Case Manager AEB Dellia Nims, CNA   Patient/Guardian was advised Release of Information must be obtained prior to any record release in order to collaborate their care with an outside provider. Patient/Guardian was advised if they have not already done so to contact the registration department to sign all necessary forms in order for [name] to release information regarding their care.   Consent: Patient/Guardian gives verbal consent for treatment and assignment of benefits for services provided during this visit. Patient/Guardian expressed understanding and agreed to proceed.  I provided 180 minutes of non-face-to-face time during this encounter.   Shade Flood, [place], LCAS 03/08/23

## 2023-03-11 ENCOUNTER — Other Ambulatory Visit (HOSPITAL_COMMUNITY): Payer: BC Managed Care – PPO | Attending: Psychiatry | Admitting: Licensed Clinical Social Worker

## 2023-03-11 DIAGNOSIS — Z79899 Other long term (current) drug therapy: Secondary | ICD-10-CM | POA: Insufficient documentation

## 2023-03-11 DIAGNOSIS — F411 Generalized anxiety disorder: Secondary | ICD-10-CM | POA: Diagnosis not present

## 2023-03-11 DIAGNOSIS — F332 Major depressive disorder, recurrent severe without psychotic features: Secondary | ICD-10-CM | POA: Diagnosis not present

## 2023-03-11 NOTE — Progress Notes (Signed)
Virtual Visit via Video Note   I connected with Katherine Steele on 03/11/23 at  9:00 AM EDT by a video enabled telemedicine application and verified that I am speaking with the correct person using two identifiers.   At orientation to the IOP program, Case Manager discussed the limitations of evaluation and management by telemedicine and the availability of in person appointments. The patient expressed understanding and agreed to proceed with virtual visits throughout the duration of the program.   Location:  Patient: Patient Home Provider: [facility] [place] [facility]   History of Present Illness: MDD and GAD  Observations/Objective: Check In: Case Manager checked in with all participants to review discharge dates, insurance authorizations, work-related documents and needs from the treatment team regarding medications. Katherine Steele stated needs and engaged in discussion.    Initial Therapeutic Activity: Counselor facilitated a check-in with Katherine Steele to assess for safety, sobriety and medication compliance.  Counselor also inquired about Katherine Steele's current emotional ratings, as well as any significant changes in thoughts, feelings or behavior since previous check in.  Katherine Steele presented for session on time and was alert, oriented x5, with no evidence or self-report of active SI/HI or A/V H.  Katherine Steele reported compliance with medication and denied use of alcohol or illicit substances.  Katherine Steele reported scores of 2/10 for depression, 1/10 for anxiety, and 0/10 for anger/irritability.  Katherine Steele denied any recent outbursts or panic attacks.  Katherine Steele reported that a recent success was watching a basketball game with family and cleaning/organizing around the home over the weekend.  Katherine Steele denied any new struggles.  Katherine Steele reported that her goal today is to run some errands once the weather warms up outside.      Second Therapeutic Activity: Counselor covered topic of core beliefs with group today.  Counselor virtually shared a handout on the  subject, which explained how everyone looks at the world differently, and two people can have the same experience, but have different interpretations of what happened.  Members were encouraged to think of these like sunglasses with different "shades" influencing perception towards positive or negative outcomes.  Examples of negative core beliefs were provided, such as "I'm unlovable", "I'm not good enough", and "I'm a bad person".  Members were asked to share which one(s) they could relate to, and then identify evidence which contradicts these beliefs.  Counselor also provided psychoeducation on positive affirmations today.  Counselor explained how these are positive statements which can be spoken out loud or recited mentally to challenge negative thoughts and/or core beliefs to improve mood and outlook each day.  Counselor shared a comprehensive list of affirmations virtually to members with different categories, including ones for health, confidence, success, and happiness.  Counselor invited members to look through this list and identify any which resonated with them, and practice saying them out loud with sincerity.  Intervention was effective, as evidenced by Katherine Steele successfully participating in discussion on the subject and reporting that she could relate to several negative core beliefs listed on the handout, such as "I'm not worthy", and "People can't be trusted".  Katherine Steele was able to successfully challenge the core belief "I am stuck" by listing evidence that contradicted it, reporting that she has made progress with her mental health through therapy, including learning new coping skills and gaining valuable support through engagement in group.  Katherine Steele stated "I have put a lot of work into this program and it has made a difference with my outlook for sure".  Katherine Steele also reported that she liked  several of the positive affirmations listed, such as "I matter and what I have to offer this world also matters", "This  too shall pass", "I learn from my challenges and find ways to overcome them", "All my problems have solutions", "I see perfection in both my virtues and flaws", and "I am safe and sound, and all is well".    Third Therapeutic Activity: Counselor offered to teach group members an ACT relaxation technique today to aid in managing difficult thoughts, feelings, urges, and sensations.  Counselor guided members through process of getting comfortable, achieving relaxing breathing rhythm, and then maintaining this throughout activity.  Counselor invited members to imagine a gently flowing stream in their mind with leaves floating upon it, and when any thoughts, feelings, urges, or sensations arose, good or bad, they were instructed to visualize placing them on these passing leaves over course of 10 minutes practice.  Intervention was effective, as evidenced by Katherine Steele successfully participating in activity and reporting that she found this exercise relatively easy, was able to visualize the stream clearly, and achieve a calm state from practicing it for several minutes.  She stated "It became almost a spiritual thing for me. I would do it again, its relaxing".  Assessment and Plan: Counselor recommends that Katherine Steele remain in IOP treatment to better manage mental health symptoms, ensure stability and pursue completion of treatment plan goals. Counselor recommends adherence to crisis/safety plan, taking medications as prescribed, and following up with medical professionals if any issues arise.   Follow Up Instructions: Counselor will send Webex link for next session. Katherine Steele was advised to call back or seek an in-person evaluation if the symptoms worsen or if the condition fails to improve as anticipated.   Collaboration of Care:   Medication Management AEB Dr. [name] or Ricky Ala, NP                                          Case Manager AEB Dellia Nims, CNA   Patient/Guardian was advised Release of  Information must be obtained prior to any record release in order to collaborate their care with an outside provider. Patient/Guardian was advised if they have not already done so to contact the registration department to sign all necessary forms in order for [name] to release information regarding their care.   Consent: Patient/Guardian gives verbal consent for treatment and assignment of benefits for services provided during this visit. Patient/Guardian expressed understanding and agreed to proceed.  I provided 160 minutes of non-face-to-face time during this encounter.   Shade Flood, [place], LCAS 03/11/23

## 2023-03-12 ENCOUNTER — Other Ambulatory Visit (HOSPITAL_COMMUNITY): Payer: BC Managed Care – PPO | Attending: Licensed Clinical Social Worker | Admitting: Licensed Clinical Social Worker

## 2023-03-12 DIAGNOSIS — F411 Generalized anxiety disorder: Secondary | ICD-10-CM | POA: Diagnosis not present

## 2023-03-12 DIAGNOSIS — F332 Major depressive disorder, recurrent severe without psychotic features: Secondary | ICD-10-CM

## 2023-03-12 NOTE — Progress Notes (Signed)
Virtual Visit via Video Note   I connected with [name] on 03/12/23 at 9:00am by video enabled telemedicine application and verified that I am speaking with the correct person using two identifiers.   I discussed the limitations, risks, security and privacy concerns of performing an evaluation and management service by video and the availability of in person appointments. I also discussed with the patient that there may be a patient responsible charge related to this service. The patient expressed understanding and agreed to proceed.   I discussed the assessment and treatment plan with the patient. The patient was provided an opportunity to ask questions and all were answered. The patient agreed with the plan and demonstrated an understanding of the instructions.   The patient was advised to call back or seek an in-person evaluation if the symptoms worsen or if the condition fails to improve as anticipated.   I provided 47 minutes of non-face-to-face time during this encounter.     Shade Flood, LCSW, LCAS ________________________________ THERAPIST PROGRESS NOTE   Session Time: 9:00am - 9:47am              Location: Patient: Patient Home Provider: Clinical Home Office   Participation Level: Active   Behavioral Response: Alert, casually dressed, euthymic mood/affect   Type of Therapy:  Individual Therapy   Treatment Goals addressed: Mood management; Medication Compliance  Progress Towards Goals: Progressing    Interventions: CBT, DBT skills  Summary: Davis Rosebush is a 52 year old married Caucasian female that presented for therapy session today with diagnoses of Generalized Anxiety Disorder, and Major depressive disorder, recurrent, severe.       Suicidal/Homicidal: None; without plan or intent.    Therapist Response: Avee was scheduled to attend [facility] today, but due to low census, an individual session was held instead.  Clinician met with [name] for virtual therapy  appointment and assessed for safety, sobriety, and medication compliance.  Jeileen presented for today's session on time and was alert, oriented x5, with no evidence or self-report of active SI/HI or A/V H.  Pinki reported ongoing compliance with medication and denied any use of alcohol or illicit substances.  Clinician inquired about Kyleen's emotional ratings today, as well as any significant changes in thoughts, feelings, or behavior since previous check-in.  Denessa reported scores of 2/10 for depression, 1/10 for anxiety, and 0/10 for anger/irritability. Shardee denied experiencing any recent panic attacks or outbursts.  Javeria reported that a recent success was doing some work around the home to stay busy.  She reported that she continues to struggle with anger occasionally, and hoped to learn new coping skills to manage this distressing emotion.  Clinician covered topic of distress tolerance skills today.  Clinician utilized a DBT handout which explained how distressing situations don't always have quick solutions, so the only choice is to sit with uncomfortable emotions until they pass.  Clinician offered the IMPROVE acronym as a solution to this problem, which outlined various skills (i.e. Imagery, Meaning, Prayer, Relaxation, 'One thing in the moment', Vacation, and Encouragement) that could be explored in order to improve ability to tolerate discomfort.  Clinician tasked [name] with identifying personalized strategies for each category which could have been implemented to handle a recent challenge more effectively.  Intervention was effective, as evidenced by [name] actively engaging in discussion on subject, reporting that most often she has been triggered by arguments with her partner, which can cause her anger to flare up.  Britini was able to  identify several strategies for handling a similar struggle in the future, including visualizing herself in her 'peaceful place' to calm down, imagining a better outcome for  the argument, identifying ways to grow from this disagreement such as improved conflict resolution skills if they handle it maturely, praying to her higher power, reciting biblical scripture, doing a yoga routine, practicing deep breathing, listening to calming music, doing a sudoku puzzle, taking a walk outside, watching a favorite movie, or calling a positive friend.  Daris stated "I think these would all be good for redirecting my attention".  Clinician will continue to monitor.    Plan: Follow up again in 1 day virtually.   Diagnosis: Generalized Anxiety Disorder, and Major depressive disorder, recurrent, severe.     Collaboration of Care:   Medication Management AEB Dr. [name] or Ricky Ala, NP                                          Case Manager AEB Dellia Nims, CNA                                                 Patient/Guardian was advised Release of Information must be obtained prior to any record release in order to collaborate their care with an outside provider. Patient/Guardian was advised if they have not already done so to contact the registration department to sign all necessary forms in order for [name] to release information regarding their care.    Consent: Patient/Guardian gives verbal consent for treatment and assignment of benefits for services provided during this visit. Patient/Guardian expressed understanding and agreed to proceed.   Shade Flood, [place], LCAS 03/12/23

## 2023-03-13 ENCOUNTER — Other Ambulatory Visit (HOSPITAL_BASED_OUTPATIENT_CLINIC_OR_DEPARTMENT_OTHER): Payer: BC Managed Care – PPO | Admitting: Licensed Clinical Social Worker

## 2023-03-13 DIAGNOSIS — F332 Major depressive disorder, recurrent severe without psychotic features: Secondary | ICD-10-CM

## 2023-03-13 DIAGNOSIS — F411 Generalized anxiety disorder: Secondary | ICD-10-CM | POA: Diagnosis not present

## 2023-03-13 NOTE — Progress Notes (Signed)
Virtual Visit via Video Note   I connected with [name] on 03/13/23 at 9:00am by video enabled telemedicine application and verified that I am speaking with the correct person using two identifiers.   I discussed the limitations, risks, security and privacy concerns of performing an evaluation and management service by video and the availability of in person appointments. I also discussed with the patient that there may be a patient responsible charge related to this service. The patient expressed understanding and agreed to proceed.   I discussed the assessment and treatment plan with the patient. The patient was provided an opportunity to ask questions and all were answered. The patient agreed with the plan and demonstrated an understanding of the instructions.   The patient was advised to call back or seek an in-person evaluation if the symptoms worsen or if the condition fails to improve as anticipated.   I provided 1 hour of non-face-to-face time during this encounter.     Shade Flood, LCSW, LCAS ________________________________ THERAPIST PROGRESS NOTE   Session Time: 9:00am - 10:00am              Location: Patient: Patient Home Provider: Clinical Home Office   Participation Level: Active   Behavioral Response: Alert, casually dressed, euthymic mood/affect   Type of Therapy:  Individual Therapy   Treatment Goals addressed: Depression, and Anxiety management; Medication Compliance; Communication skills   Progress Towards Goals: Progressing    Interventions: CBT, conflict resolution skills  Summary: Nature Millet is a 52 year old married Caucasian female that presented for therapy session today with diagnoses of Generalized Anxiety Disorder, and Major depressive disorder, recurrent, severe.       Suicidal/Homicidal: None; without plan or intent.    Therapist Response: Manon was scheduled to attend [facility] today, but due to low census, an individual session was held  instead.  Clinician met with [name] for virtual therapy session and assessed for safety, sobriety, and medication compliance.  Jorgie presented for today's appointment on time and was alert, oriented x5, with no evidence or self-report of active SI/HI or A/V H.  Shevonda reported ongoing compliance with medication and denied any use of alcohol or illicit substances.  Clinician inquired about Dyamond's current emotional ratings, as well as any significant changes in thoughts, feelings, or behavior since last check-in.  Ramey reported scores of 2/10 for depression, 1/10 for anxiety, and 0/10 for anger/irritability. Shanley denied experiencing any recent panic attacks or outbursts.  Amri reported that a recent success was running errands with her son yesterday, including going to the pet store.  She reported that an ongoing goal is improving overall communication skills while in therapy.  Clinician covered topic of conflict resolution today and virtually shared a handout on subject with Tahjae which warned against the 'four horsemen' of communication traps that should be avoided due to tendency to escalate and damage a relationship.  These included criticism, defensiveness, contempt, and stonewalling.  'Antidotes' to these harmful behaviors were offered as healthy replacements to improve communication and understanding, including approaching problems with a gentle startup approach, taking responsibility for one's behavior, sharing fondness/admiration, and using self-soothing to calm down and focus on the problem at hand.  Clinician encouraged Elnore to apply this material to recent experiences with conflict that she has faced, consider which approach she utilized, and any changes that she could plan to implement in order to improve overall conflict resolution skills.  Intervention was effective, as evidenced by [name] actively participating in  discussion on topic, reporting that she has engaged in some of these communication  traps before, including defensiveness, contempt, or stonewalling.  Jeremi reported that this has led to consequences such as elevating arguments to a higher degree when she attempts to change the subject, or mock the other person.  Cyndle expressed receptiveness to alternative strategies offered in order to improve conflict resolution skills, including practicing her "I" statements in order to express feelings in calm, assertive manner; taking accountability for her own actions when she makes mistakes; showing affection for her partner instead of contempt; and taking timeouts when needed to calm down before returning to the problem at hand to explore solutions.  Clinician will continue to monitor.   Plan: Follow up again in 1 day virtually.   Diagnosis: Generalized Anxiety Disorder, and Major depressive disorder, recurrent, severe.     Collaboration of Care:   Medication Management AEB Dr. [name] or Ricky Ala, NP                                          Case Manager AEB Dellia Nims, CNA                                                 Patient/Guardian was advised Release of Information must be obtained prior to any record release in order to collaborate their care with an outside provider. Patient/Guardian was advised if they have not already done so to contact the registration department to sign all necessary forms in order for [name] to release information regarding their care.    Consent: Patient/Guardian gives verbal consent for treatment and assignment of benefits for services provided during this visit. Patient/Guardian expressed understanding and agreed to proceed.   Shade Flood, [place], LCAS 03/13/23

## 2023-03-14 ENCOUNTER — Other Ambulatory Visit (HOSPITAL_COMMUNITY): Payer: BC Managed Care – PPO

## 2023-03-15 ENCOUNTER — Other Ambulatory Visit (HOSPITAL_COMMUNITY): Payer: BC Managed Care – PPO

## 2023-03-18 ENCOUNTER — Other Ambulatory Visit (HOSPITAL_COMMUNITY): Payer: BC Managed Care – PPO | Attending: Licensed Clinical Social Worker | Admitting: Psychiatry

## 2023-03-18 DIAGNOSIS — F332 Major depressive disorder, recurrent severe without psychotic features: Secondary | ICD-10-CM

## 2023-03-18 DIAGNOSIS — F411 Generalized anxiety disorder: Secondary | ICD-10-CM

## 2023-03-18 NOTE — Progress Notes (Signed)
Virtual Visit via Video Note  I connected with Katherine Steele on 03/18/23 at  9:00 AM EDT by a video enabled telemedicine application and verified that I am speaking with the correct person using two identifiers.  Location: Patient: Home Provider: Office   I discussed the limitations of evaluation and management by telemedicine and the availability of in person appointments. The patient expressed understanding and agreed to proceed.    I discussed the assessment and treatment plan with the patient. The patient was provided an opportunity to ask questions and all were answered. The patient agreed with the plan and demonstrated an understanding of the instructions.   The patient was advised to call back or seek an in-person evaluation if the symptoms worsen or if the condition fails to improve as anticipated.     Freida Busman, MD  [facility] Discharge Summary  TAVITA ORIANS [id number]  Admission date: 02/25/2023 Discharge date: 03/18/2023  Reason for admission: Depression w/ SA and hospitalization Katherine Steele is a 52 yr old female who presents via Virtual Video Visit and discharge.  PPHx is significant for Depression, GAD, and Gender Dysphoria, and 2 Suicide Attempts Via OD (last 01/2023) and 2 Psychiatric Hospitalizations (last- [facility] 01/2023), and no history of Self Injurious Behavior. Patient completed PHP 3/7 and starts IOP 02/25/2023. Current medication regimen includes:   Chemical Use History:  Etoh- 4 glasses of wine/ week THC- Last use was 11/2022, trying to be sober hx of [place] Cigs- no NO other substances Family of Origin Issues: P Uncle- Bipolar Dad- Etoh use dx Multiple Paternal-family members had Etoh use dx P Uncle and 2 P cousin- suicides P cousin-schizophrenia Son- High Functioning Autism Cousin- Low Functioning Autism  Progress in Program Toward Treatment Goals: Met as evidenced by below  Psychiatric Specialty  Exam:   Review of Systems  Neurological:  Negative for headaches.  Psychiatric/Behavioral:  Negative for dysphoric mood, hallucinations and suicidal ideas. The patient is not nervous/anxious.     Last menstrual period 07/17/2012.There is no height or weight on file to calculate BMI.  General Appearance: Fairly Groomed  Eye Contact:  Good  Speech:  Clear and Coherent  Volume:  Normal  Mood:  Euthymic  Affect:  Appropriate and Congruent  Thought Process:  Coherent  Orientation:  Full (Time, Place, and Person)  Thought Content:  Logical  Suicidal Thoughts:  No  Homicidal Thoughts:  No  Memory:  Immediate;   Good Recent;   Good  Judgement:  Good  Insight:  Good  Psychomotor Activity:  Normal  Concentration:  Concentration: Good  Recall:  NA  Fund of Knowledge:  Good  Language:  Good  Akathisia:  NA  Handed:    AIMS (if indicated):     Assets:  Communication Skills Desire for Improvement Financial Resources/Insurance Housing Leisure Time Resilience Social Support Vocational/Educational  ADL's:  Intact  Cognition:  WNL  Sleep:   Good    Progress (rationale):   Patient is compliant with her meds. Patient reports that she is doing well. Patient endorses that she has learned a lot of tools and has multiple things to reference for stress. Patient reports that her sleep has been good. Patient reports that she plans to go back to work this week, and feels much better about going back now. Patient feels more optimistic and has less catastrophic thoughts. Patient is able to see the positive even in the events that led to her depressive episode.  She has also been communicated with boss about her previous concerns and will be transferring teams. Patient will be going to couples counseling with her husband, she has already started it went well overall. Patient her appetite is good. She will be getting more physical activity and is looking forward to getting back into her work routine.  Patient denies SI she endorses family and friends are protective factors. Patient denies HI and AVH. Patient denies adverse side effects of medication. Patient denies severe anxiety ranking it a 1/10. Patient reports that her mood is significantly and really likes her Effexor for anxiety. She endorses that her depression is around a 2.   She has been very active in sessions, and therapist endorse improvement and good rankings for low depression and anxiety.     MDD, Recurrent, Severe, w/out Psychosis  GAD:  Medication - Continue Effexor XR 112.5mg , no refills needed - Continue Abilify 5mg  daily, no refills needed   Has an appt at with [facility], who does testing for ADHD.  Collaboration of Care: Other IOP team , Will follow up with Dr. Shea Evans will just  go to marriage counslor may go back to "insight therapy" for individual therapy  Patient/Guardian was advised Release of Information must be obtained prior to any record release in order to collaborate their care with an outside provider. Patient/Guardian was advised if they have not already done so to contact the registration department to sign all necessary forms in order for [name] to release information regarding their care.   Consent: Patient/Guardian gives verbal consent for treatment and assignment of benefits for services provided during this visit. Patient/Guardian expressed understanding and agreed to proceed.   PGY-3 [name], MD [facility] 03/18/2023

## 2023-03-18 NOTE — Progress Notes (Signed)
Virtual Visit via Video Note   I connected with Katherine Steele on 03/18/23 at 9:00am by video enabled telemedicine application and verified that I am speaking with the correct person using two identifiers.   I discussed the limitations, risks, security and privacy concerns of performing an evaluation and management service by video and the availability of in person appointments. I also discussed with the patient that there may be a patient responsible charge related to this service. The patient expressed understanding and agreed to proceed.   I discussed the assessment and treatment plan with the patient. The patient was provided an opportunity to ask questions and all were answered. The patient agreed with the plan and demonstrated an understanding of the instructions.   The patient was advised to call back or seek an in-person evaluation if the symptoms worsen or if the condition fails to improve as anticipated.   I provided 65 minutes of non-face-to-face time during this encounter.     Shade Flood, LCSW, LCAS ________________________________ THERAPIST PROGRESS NOTE   Session Time: 9:00am - 10:05am             Location: Patient: Patient Home Provider: [facility]   Participation Level: Active   Behavioral Response: Alert, casually dressed, euthymic mood/affect   Type of Therapy:  Individual Therapy   Treatment Goals addressed: Depression, and Anxiety management; Medication Compliance  Progress Towards Goals: Progressing    Interventions: CBT, grounding skills  Summary: Katherine Steele is a 52 year old married Caucasian female that presented for therapy session today with diagnoses of Generalized Anxiety Disorder, and Major depressive disorder, recurrent, severe.       Suicidal/Homicidal: None; without plan or intent.    Therapist Response: Ranezmae was scheduled to attend [facility] today, but due to low census, an individual session was held instead.  Clinician met with [name] for  virtual therapy appointment and assessed for safety, sobriety, and medication compliance.  Mele presented for today's session on time and was alert, oriented x5, with no evidence or self-report of active SI/HI or A/V H.  Isabellamarie reported ongoing compliance with medication and denied any use of alcohol or illicit substances.  Clinician inquired about [name]'s current emotional ratings, as well as any significant changes in thoughts, feelings, or behavior since previous check-in.  [name] reported scores of 2/10 for depression, 1/10 for anxiety, and 0/10 for anger/irritability. [name] denied experiencing any recent panic attacks or outbursts.  [name] reported that a recent success was having a nice trip with family over the weekend for Easter, stating "That was pleasant.  It was hard to come home".  [name] reported that one struggle is anticipating stress when she returns back to a normal work schedule.  Clinician introduced topic of grounding skills today.  Clinician defined these as simple strategies one can use to help detach from difficult thoughts or feelings temporarily by focusing on something else.  Clinician noted that grounding will not solve the problem at hand, but can provide the practitioner with time to regain control over their thoughts and/or feelings and prevent the situation from getting worse (i.e. interrupting a panic attack).  Clinician divided these into three categories (mental, physical, and soothing) and then provided examples of each which Verne could practice during session.  Some of these included describing one's environment in detail or playing a categories game with oneself for mental category, taking a hot bath/shower, stretching, or carrying a grounding object for physical category, and saying kind statements, or visualizing  people one cares about for soothing category.  Clinician inquired about which techniques [name] has used with success in the past, or will commit to learning, practicing,  and applying now to improve coping abilities.  Intervention was effective, as evidenced by [name] participating in discussion on the subject, trying out several of the techniques during session, and expressing interest in adding several to her available coping skills, such as describing her environment in great detail, playing a categories game involving listing car brands, using her imagination to picture herself somewhere more calming, reading an engrossing story, finding something to watch that makes her laugh, running cool water on her skin, holding an icecube in her hand, using a smooth stone as a grounding object, doing a yoga routine, practicing deep breathing, reciting positive affirmations, looking at baby pictures of her son, eating a piece of her favorite candy, or making plans to see a close friend.  Clinician will continue to monitor.    Plan: [name] has completed [facility] and will be discharged today.  Clinician recommends adherence to crisis/safety plan, taking medications as prescribed, and following up with medical professionals if any issues arise.   Diagnosis: Generalized Anxiety Disorder, and Major depressive disorder, recurrent, severe.     Collaboration of Care:   Medication Management AEB Dr. [name] or Ricky Ala, NP                                          Case Manager AEB Dellia Nims, CNA                                                 Patient/Guardian was advised Release of Information must be obtained prior to any record release in order to collaborate their care with an outside provider. Patient/Guardian was advised if they have not already done so to contact the registration department to sign all necessary forms in order for [name] to release information regarding their care.    Consent: Patient/Guardian gives verbal consent for treatment and assignment of benefits for services provided during this visit. Patient/Guardian expressed understanding and agreed to proceed.   Shade Flood, [place], LCAS 03/18/23

## 2023-03-21 ENCOUNTER — Other Ambulatory Visit: Payer: Self-pay | Admitting: Psychiatry

## 2023-03-21 DIAGNOSIS — F331 Major depressive disorder, recurrent, moderate: Secondary | ICD-10-CM

## 2023-03-21 DIAGNOSIS — F419 Anxiety disorder, unspecified: Secondary | ICD-10-CM

## 2023-04-03 ENCOUNTER — Ambulatory Visit (INDEPENDENT_AMBULATORY_CARE_PROVIDER_SITE_OTHER): Payer: BC Managed Care – PPO | Admitting: Psychiatry

## 2023-04-03 ENCOUNTER — Encounter: Payer: Self-pay | Admitting: Psychiatry

## 2023-04-03 VITALS — BP 128/80 | HR 62 | Temp 98.0°F | Ht 67.0 in | Wt 215.6 lb

## 2023-04-03 DIAGNOSIS — F640 Transsexualism: Secondary | ICD-10-CM | POA: Diagnosis not present

## 2023-04-03 DIAGNOSIS — Z79899 Other long term (current) drug therapy: Secondary | ICD-10-CM

## 2023-04-03 DIAGNOSIS — F4010 Social phobia, unspecified: Secondary | ICD-10-CM | POA: Diagnosis not present

## 2023-04-03 DIAGNOSIS — T50905A Adverse effect of unspecified drugs, medicaments and biological substances, initial encounter: Secondary | ICD-10-CM

## 2023-04-03 DIAGNOSIS — R635 Abnormal weight gain: Secondary | ICD-10-CM

## 2023-04-03 DIAGNOSIS — F3342 Major depressive disorder, recurrent, in full remission: Secondary | ICD-10-CM

## 2023-04-03 MED ORDER — VENLAFAXINE HCL ER 37.5 MG PO CP24: 37.5000 mg | ORAL_CAPSULE | Freq: Every day | ORAL | 2 refills | Status: DC

## 2023-04-03 MED ORDER — METFORMIN HCL 500 MG PO TABS: 500.0000 mg | ORAL_TABLET | Freq: Every day | ORAL | 2 refills | Status: DC

## 2023-04-03 NOTE — Progress Notes (Unsigned)
BH MD OP Progress Note  04/03/2023 5:13 PM Katherine Steele  MRN:  [id number]  Chief Complaint:  Chief Complaint  Patient presents with   Follow-up   Depression   Anxiety   Medication Refill   HPI: Katherine Steele is a 52 year old Caucasian female, married, employed, lives in [place], has a history of MDD, anxiety disorder unspecified, gender dysphoria, long-term use of cannabis was evaluated in office today.  Patient recently completed PHP and MH IOP-last date 03/18/2023.  I have reviewed notes-most recent from Ms. Clark as well as Mr. Perlie Mayo.  Patient today appeared to be alert, oriented to person place time and situation.  Patient reports overall mood symptoms as better on the current combination of medications.  She is on venlafaxine 112.5 mg as well as Abilify 5 mg daily.  She does report weight gain side effects likely due to Abilify.  She is motivated to start watching her diet and exercise.  Patient agreeable to trial of metformin for antipsychotic induced weight gain.  Patient reports sleep is overall good.  Denies any suicidality, homicidality or perceptual disturbances.  Currently planning to find a new job, has an appointment on April 29.  Looks forward to that.  She reports her spouse is more supportive.  She is planning to start psychotherapy, individual as well as family therapy with Ms. Atha Starks.  Patient denies any other concerns today.  Visit Diagnosis:    ICD-10-CM   1. MDD (major depressive disorder), recurrent, in full remission  F33.42 venlafaxine XR (EFFEXOR XR) 37.5 MG 24 hr capsule    Hemoglobin A1C    2. Social anxiety disorder  F40.10 Hemoglobin A1C    3. Gender dysphoria in adult  F64.0     4. High risk medication use  Z79.899 Hemoglobin A1C    5. Weight gain due to medication  R63.5 metFORMIN (GLUCOPHAGE) 500 MG tablet   T50.905A    Antipsychotics      Past Psychiatric History: I have reviewed past psychiatric history from progress  note on 11/30/2022.  Past trials of medications like Zoloft, Wellbutrin-made her crazy, Xanax. Patient with recent suicide attempt-inpatient behavioral health admission-June 01/18/2023 - 01/19/2023-after an overdose on 30 pills of propranolol 10 mg.  Past Medical History:  Past Medical History:  Diagnosis Date   BMI 33.0-33.9,adult    Endometriosis    s/p hysterectomy   GAD (generalized anxiety disorder)    Dr. Lucianne Muss psychiatrist   Gender dysphoria 2014   did undergo 10 mo testosterone treatment   GERD (gastroesophageal reflux disease)    HLD (hyperlipidemia) 08/12/2014   Mild off meds    MDD (major depressive disorder)    Dr. Lucianne Muss psychiatrist   PONV (postoperative nausea and vomiting)    after finger surgery    Past Surgical History:  Procedure Laterality Date   CESAREAN SECTION     COLONOSCOPY  2007   WNL [facility])   COLONOSCOPY WITH PROPOFOL N/A 03/31/2021   WNL, rpt 10 yrs [name], Jarales B, MD)   ESOPHAGOGASTRODUODENOSCOPY (EGD) WITH PROPOFOL N/A 03/21/2018   barrett's without dysplasia, mild chronic gastritis, reflux gastroesophagitis [name], [name], MD)   ESOPHAGOGASTRODUODENOSCOPY (EGD) WITH PROPOFOL N/A 03/31/2021   small HH, shatski ring, ?barretts - biopsies WNL [name], Michel Bickers B, MD)   FINGER SURGERY Right 2007   little finger   OOPHORECTOMY Left 2006   TONSILLECTOMY  1975   TOTAL ABDOMINAL HYSTERECTOMY  12/2012   endometriosis and ovarian cysts s/p ovaries removed  TYMPANOSTOMY TUBE PLACEMENT Bilateral 1976    Family Psychiatric History: I have reviewed family psychiatric history from progress note on 11/30/2022.  Family History:  Family History  Problem Relation Age of Onset   Diabetes Mother    Cancer Mother 69       ovarian with mets   Rheum arthritis Mother    Hypertension Father    Alcohol abuse Father        history   Rheum arthritis Maternal Aunt    CAD Maternal Uncle 23       MI   Depression Paternal Uncle    Alcohol abuse  Paternal Uncle    Suicidality Paternal Uncle    CAD Maternal Grandfather 25       MI   Depression Cousin    Alcohol abuse Cousin    Suicidality Cousin    Drug abuse Cousin    Suicidality Cousin    Anxiety disorder Son    Autism spectrum disorder Son    Stroke Neg Hx     Social History: I have reviewed social history from progress note on 11/30/2022. Social History   Socioeconomic History   Marital status: Married    Spouse name: michael   Number of children: 1   Years of education: Not on file   Highest education level: Associate degree: academic program  Occupational History   Not on file  Tobacco Use   Smoking status: Former    Types: Cigarettes    Quit date: 02/06/1993    Years since quitting: 30.1   Smokeless tobacco: Never  Vaping Use   Vaping Use: Never used  Substance and Sexual Activity   Alcohol use: Not Currently    Comment: social/weekends   Drug use: Not Currently    Types: Marijuana    Comment: stopped 2 weeks ago smoking marijuana   Sexual activity: Yes  Other Topics Concern   Not on file  Social History Narrative   Lives with husband [name]) and son [name])   Occupation: Lexicographer   Edu: Associate's degree   Activity: exercise 3x/wk   Diet: good water, fruits/vegetables some   Social Determinants of Health   Financial Resource Strain: Not on file  Food Insecurity: No Food Insecurity (01/18/2023)   Hunger Vital Sign    Worried About Running Out of Food in the Last Year: Never true    Ran Out of Food in the Last Year: Never true  Transportation Needs: No Transportation Needs (01/18/2023)   PRAPARE - Administrator, Civil Service (Medical): No    Lack of Transportation (Non-Medical): No  Physical Activity: Not on file  Stress: Not on file  Social Connections: Not on file    Allergies: No Known Allergies  Metabolic Disorder Labs: No results found for: "HGBA1C", "MPG" No results found for: "PROLACTIN" Lab Results   Component Value Date   CHOL 236 (H) 01/16/2023   TRIG 142.0 01/16/2023   HDL 58.70 01/16/2023   CHOLHDL 4 01/16/2023   VLDL 28.4 01/16/2023   LDLCALC 149 (H) 01/16/2023   LDLCALC 125 (H) 12/29/2021   Lab Results  Component Value Date   TSH 1.793 12/20/2022   TSH 2.88 06/26/2021    Therapeutic Level Labs: No results found for: "LITHIUM" No results found for: "VALPROATE" No results found for: "CBMZ"  Current Medications: Current Outpatient Medications  Medication Sig Dispense Refill   ARIPiprazole (ABILIFY) 5 MG tablet Take 1 tablet (5 mg total) by mouth daily. 30 tablet 0  metFORMIN (GLUCOPHAGE) 500 MG tablet Take 1 tablet (500 mg total) by mouth daily with breakfast. 30 tablet 2   omeprazole (PRILOSEC) 40 MG capsule Take 1 capsule (40 mg total) by mouth daily. 30 capsule 0   rosuvastatin (CRESTOR) 10 MG tablet Take 1 tablet (10 mg total) by mouth daily after supper. 90 tablet 3   venlafaxine XR (EFFEXOR-XR) 75 MG 24 hr capsule Take 1 capsule (75 mg total) by mouth daily with breakfast. Take along with Effexor XR 37.5 mg -total of 112.5 mg daily 30 capsule 0   venlafaxine XR (EFFEXOR XR) 37.5 MG 24 hr capsule Take 1 capsule (37.5 mg total) by mouth daily with breakfast. Take along with 75 mg daily 30 capsule 2   No current facility-administered medications for this visit.     Musculoskeletal: Strength & Muscle Tone: within normal limits Gait & Station: normal Patient leans: N/A  Psychiatric Specialty Exam: Review of Systems  Psychiatric/Behavioral:  The patient is nervous/anxious.   All other systems reviewed and are negative.   Blood pressure 128/80, pulse 62, temperature 98 F (36.7 C), temperature source Skin, height 5\' 7"  (1.702 m), weight 215 lb 9.6 oz (97.8 kg), last menstrual period 07/17/2012.Body mass index is 33.77 kg/m.  General Appearance: Casual  Eye Contact:  Good  Speech:  Normal Rate  Volume:  Normal  Mood:  Anxious  Affect:  Appropriate  Thought  Process:  Goal Directed and Descriptions of Associations: Intact  Orientation:  Full (Time, Place, and Person)  Thought Content: Logical   Suicidal Thoughts:  No  Homicidal Thoughts:  No  Memory:  Immediate;   Fair Recent;   Fair Remote;   Fair  Judgement:  Fair  Insight:  Fair  Psychomotor Activity:  Normal  Concentration:  Concentration: Fair and Attention Span: Fair  Recall:  [employer] of Knowledge: Fair  Language: Fair  Akathisia:  No  Handed:  Right  AIMS (if indicated): not done  Assets:  Community education officer  ADL's:  Intact  Cognition: WNL  Sleep:  Fair   Screenings: AIMS    Flowsheet Row Office Visit from 04/03/2023 in [facility] Admission (Discharged) from 01/18/2023 in [facility]  AIMS Total Score 0 0      AUDIT    Flowsheet Row Admission (Discharged) from 01/18/2023 in [facility] INPATIENT BEH[facility]AL MEDICINE  Alcohol Use Disorder Identification Test Final Score (AUDIT) 0      GAD-7    Flowsheet Row Office Visit from 04/03/2023 in [facility] Counselor from 01/24/2023 in BEHAVIORAL HEALTH PARTIAL HOSPITALIZATION PROGRAM Office Visit from 01/23/2023 in [facility] Office Visit from 01/17/2023 in [facility] Office Visit from 01/16/2023 in [facility]  Total GAD-7 Score 2 13 9 13 14       PHQ2-9    Flowsheet Row Office Visit from 04/03/2023 in [facility] Counselor from 02/25/2023 in BEHAVIORAL HEALTH INTENSIVE PSYCH Counselor from 02/01/2023 in BEHAVIORAL HEALTH PARTIAL HOSPITALIZATION PROGRAM Counselor from 01/24/2023 in BEHAVIORAL HEALTH PARTIAL HOSPITALIZATION PROGRAM Office Visit from 01/23/2023 in [facility]  PHQ-2 Total Score 1 3 2 4 5   PHQ-9 Total  Score 4 14 8 15 16       Flowsheet Row Office Visit from 04/03/2023 in [facility] Counselor from 02/25/2023 in BEHAVIORAL HEALTH INTENSIVE [facility] Counselor from  02/01/2023 in BEHAVIORAL HEALTH PARTIAL HOSPITALIZATION PROGRAM  C-SSRS RISK CATEGORY High Risk Error: Question 6 not populated Error: Q3, 4, or 5 should not be populated when Q2 is No        Assessment and Plan: SINDY MCCUNE is a 52 year old female, employed, has a history of depression, anxiety, gender dysphoria was evaluated in office today.  Patient completed partial hospitalization program, MH IOP, currently improved on current medication regimen, will benefit from the following plan.  Plan MDD in remission Venlafaxine extended release 112.5 mg p.o. daily Continue Abilify 5 mg p.o. daily.  Plan to taper it off in the future. Patient aware of long-term side effects of atypical antipsychotics including weight gain side effects.  Social anxiety disorder-unstable Venlafaxine extended release 112.5 mg p.o. daily Will refer patient for psychotherapy, has upcoming appointment with Ms. Atha Starks.  Will coordinate care.  Patient is also planning to start family counseling.  Gender dysphoria-improving Patient referred for CBT.  Weight gain likely due to medication-psychotropics-unstable Start metformin 500 mg p.o. daily with a meal. Provided medication education.  High risk medication use-will order a hemoglobin A1c.  Patient to go to [facility] lab.  Collaboration of Care: Collaboration of Care: Other I have reviewed all per Mr. Perlie Mayo, Ms. Clark-PHP/IOP program-last date 03/18/2023.  Patient/Guardian was advised Release of Information must be obtained prior to any record release in order to collaborate their care with an outside provider. Patient/Guardian was advised if they have not already done so to contact the registration department to sign all necessary forms in order for [name] to  release information regarding their care.   Consent: Patient/Guardian gives verbal consent for treatment and assignment of benefits for services provided during this visit. Patient/Guardian expressed understanding and agreed to proceed.   Follow-up in clinic in 6 to 7 weeks or sooner if needed.  This note was generated in part or whole with voice recognition software. Voice recognition is usually quite accurate but there are transcription errors that can and very often do occur. I apologize for any typographical errors that were not detected and corrected.    Jomarie Longs, MD 04/04/2023, 12:50 PM

## 2023-04-03 NOTE — Patient Instructions (Signed)
Metformin Tablets What is this medication? METFORMIN (met FOR min) treats type 2 diabetes. It controls blood sugar (glucose) and helps your body use insulin effectively. This medication is often combined with changes to diet and exercise. This medicine may be used for other purposes; ask your health care provider or pharmacist if you have questions. COMMON BRAND NAME(S): Glucophage What should I tell my care team before I take this medication? They need to know if you have any of these conditions: Anemia Dehydration Frequently drink alcohol Heart disease Kidney disease Liver disease Polycystic ovary syndrome Serious infection or injury Vomiting An unusual or allergic reaction to metformin, other medications, foods, dyes, or preservatives Pregnant or trying to get pregnant Breastfeeding How should I use this medication? Take this medication by mouth with a glass of water. Take it as directed on the prescription label at the same time every day. Take it with food. Keep taking it unless your care team tells you to stop. Talk to your care team about the use of this medication in children. While it may be prescribed for children as young as 70 years of age for selected conditions, precautions do apply. Overdosage: If you think you have taken too much of this medicine contact a poison control center or emergency room at once. NOTE: This medicine is only for you. Do not share this medicine with others. What if I miss a dose? If you miss a dose, take it as soon as you can. If it is almost time for your next dose, take only that dose. Do not take double or extra doses. What may interact with this medication? Do not take this medication with any of the following: Certain contrast medications given before X-rays, CT scans, MRI, or other procedures Dofetilide This medication may also interact with the following: Acetazolamide Alcohol Certain antivirals for HIV or hepatitis Certain medications  for blood pressure, heart disease, irregular heart beat Cimetidine Dichlorphenamide Digoxin Diuretics Estrogen or progestin hormones Glycopyrrolate Isoniazid Lamotrigine Memantine Methazolamide Metoclopramide Midodrine Niacin Phenothiazines, such as chlorpromazine, mesoridazine, prochlorperazine, thioridazine Phenytoin Ranolazine Steroid medications, such as prednisone or cortisone Stimulant medications for ADHD, weight loss, or staying awake Thyroid medications Topiramate Trospium Vandetanib Zonisamide This list may not describe all possible interactions. Give your health care provider a list of all the medicines, herbs, non-prescription drugs, or dietary supplements you use. Also tell them if you smoke, drink alcohol, or use illegal drugs. Some items may interact with your medicine. What should I watch for while using this medication? Visit your care team for regular checks on your progress. Tell your care team if your symptoms do not start to get better or if they get worse. A test called the HbA1C (A1C) will be monitored. This is a simple blood test. It measures your blood sugar control over the last 2 to 3 months. You will receive this test every 3 to 6 months. Using this medication with insulin or a sulfonylurea may increase your risk of hypoglycemia. Learn how to check your blood sugar. Learn the symptoms of low and high blood sugar and how to manage them. Always carry a quick-source of sugar with you in case you have symptoms of low blood sugar. Examples include hard sugar candy or glucose tablets. Make sure others know that you can choke if you eat or drink when you develop serious symptoms of low blood sugar, such as seizures or unconsciousness. They must get medical help at once. Tell your care team if you have high  blood sugar. You might need to change the dose of your medication. If you are sick or exercising more than usual, you might need to change the dose of your  medication. Do not skip meals. Ask your care team if you should avoid alcohol. Many nonprescription cough and cold products contain sugar or alcohol. These can affect blood sugar. This medication may cause you to ovulate, which may increase your chances of becoming pregnant. Talk with your care team about contraception while you are taking this medication. Contact your care team if you think you might be pregnant. If you are going to need surgery, an MRI, CT scan, or other procedure, tell your care team that you are taking this medication. You may need to stop taking this medication before the procedure. Wear a medical ID bracelet or chain. Carry a card that describes your condition. List the medications and doses you take on the card. This medication may cause a decrease in folic acid and vitamin B12. You should make sure that you get enough vitamins while you are taking this medication. Discuss the foods you eat and the vitamins you take with your care team. What side effects may I notice from receiving this medication? Side effects that you should report to your care team as soon as possible: Allergic reactions--skin rash, itching, hives, swelling of the face, lips, tongue, or throat High lactic acid level--muscle pain or cramps, stomach pain, trouble breathing, general discomfort or fatigue Low vitamin B12 level--pain, tingling, or numbness in the hands or feet, muscle weakness, dizziness, confusion, difficulty concentrating Side effects that usually do not require medical attention (report to your care team if they continue or are bothersome): Diarrhea Gas Headache Metallic taste in mouth Nausea This list may not describe all possible side effects. Call your doctor for medical advice about side effects. You may report side effects to FDA at 1-800-FDA-1088. Where should I keep my medication? Keep out of the reach of children and pets. Store between 15 and 30 degrees C (59 and 86 degrees F).  Protect from moisture and light. Get rid of any unused medication after the expiration date. To get rid of medications that are no longer needed or expired: Take the medication to a medication take-back program. Check with your pharmacy or law enforcement to find a location. If you cannot return the medication, check the label or package insert to see if the medication should be thrown out in the garbage or flushed down the toilet. If you are not sure, ask your care team. If it is safe to put in the trash, empty the medication out of the container. Mix the medication with cat litter, dirt, coffee grounds, or other unwanted substance. Seal the mixture in a bag or container. Put it in the trash. NOTE: This sheet is a summary. It may not cover all possible information. If you have questions about this medicine, talk to your doctor, pharmacist, or health care provider.  2023 Elsevier/Gold Standard (2004-10-24 00:00:00)

## 2023-04-09 ENCOUNTER — Telehealth: Payer: Self-pay | Admitting: Psychiatry

## 2023-04-09 DIAGNOSIS — F332 Major depressive disorder, recurrent severe without psychotic features: Secondary | ICD-10-CM

## 2023-04-09 DIAGNOSIS — F411 Generalized anxiety disorder: Secondary | ICD-10-CM

## 2023-04-09 MED ORDER — ARIPIPRAZOLE 5 MG PO TABS: 5.0000 mg | ORAL_TABLET | Freq: Every day | ORAL | 1 refills | Status: DC

## 2023-04-09 NOTE — Telephone Encounter (Signed)
Will have sent Abilify 5 mg to patient's pharmacy.

## 2023-04-10 DIAGNOSIS — Z79899 Other long term (current) drug therapy: Secondary | ICD-10-CM | POA: Diagnosis not present

## 2023-04-10 DIAGNOSIS — R41840 Attention and concentration deficit: Secondary | ICD-10-CM | POA: Diagnosis not present

## 2023-04-10 DIAGNOSIS — Z5181 Encounter for therapeutic drug level monitoring: Secondary | ICD-10-CM | POA: Diagnosis not present

## 2023-04-10 DIAGNOSIS — Z79891 Long term (current) use of opiate analgesic: Secondary | ICD-10-CM | POA: Diagnosis not present

## 2023-04-17 DIAGNOSIS — F3341 Major depressive disorder, recurrent, in partial remission: Secondary | ICD-10-CM | POA: Diagnosis not present

## 2023-04-17 DIAGNOSIS — R41840 Attention and concentration deficit: Secondary | ICD-10-CM | POA: Diagnosis not present

## 2023-04-17 DIAGNOSIS — Z87820 Personal history of traumatic brain injury: Secondary | ICD-10-CM | POA: Diagnosis not present

## 2023-05-05 ENCOUNTER — Other Ambulatory Visit: Payer: Self-pay | Admitting: Psychiatry

## 2023-05-05 DIAGNOSIS — F332 Major depressive disorder, recurrent severe without psychotic features: Secondary | ICD-10-CM

## 2023-05-05 DIAGNOSIS — F411 Generalized anxiety disorder: Secondary | ICD-10-CM

## 2023-05-09 ENCOUNTER — Encounter (INDEPENDENT_AMBULATORY_CARE_PROVIDER_SITE_OTHER): Payer: BC Managed Care – PPO

## 2023-05-09 DIAGNOSIS — F411 Generalized anxiety disorder: Secondary | ICD-10-CM | POA: Diagnosis not present

## 2023-05-09 DIAGNOSIS — F332 Major depressive disorder, recurrent severe without psychotic features: Secondary | ICD-10-CM

## 2023-05-09 MED ORDER — VENLAFAXINE HCL ER 150 MG PO CP24: 150.0000 mg | ORAL_CAPSULE | Freq: Every day | ORAL | 1 refills | Status: DC

## 2023-05-09 NOTE — Telephone Encounter (Signed)
Attempted to contact patient by phone and had to leave a voicemail.  Per request from patient will go ahead and increase venlafaxine to extended release 150 mg p.o. daily.  Will send a message to patient not to stop the Abilify yet.  A prescription for Abilify 5 mg was sent to pharmacy on 05/06/2023.  Patient could reduce it into 1/2 tablet daily if she is interested in tapering it off.   I have spent at least 6 minutes non face to face with patient today.

## 2023-05-23 DIAGNOSIS — F332 Major depressive disorder, recurrent severe without psychotic features: Secondary | ICD-10-CM

## 2023-05-24 MED ORDER — VENLAFAXINE HCL ER 75 MG PO CP24: 75.0000 mg | ORAL_CAPSULE | Freq: Every day | ORAL | 1 refills | Status: DC

## 2023-05-24 NOTE — Telephone Encounter (Signed)
I have sent venlafaxine 75 mg to pharmacy per her request.

## 2023-05-27 ENCOUNTER — Other Ambulatory Visit
Admission: RE | Admit: 2023-05-27 | Discharge: 2023-05-27 | Disposition: A | Discharge status: Home / Self Care | Payer: BC Managed Care – PPO | Source: Ambulatory Visit | Attending: Psychiatry | Admitting: Psychiatry

## 2023-05-27 DIAGNOSIS — Z79899 Other long term (current) drug therapy: Secondary | ICD-10-CM | POA: Insufficient documentation

## 2023-05-27 DIAGNOSIS — F3342 Major depressive disorder, recurrent, in full remission: Secondary | ICD-10-CM | POA: Insufficient documentation

## 2023-05-27 DIAGNOSIS — F4010 Social phobia, unspecified: Secondary | ICD-10-CM | POA: Insufficient documentation

## 2023-05-27 LAB — HEMOGLOBIN A1C
Hgb A1c MFr Bld: 5.3 % (ref 4.8–5.6)
Mean Plasma Glucose: 105.41 mg/dL

## 2023-05-30 ENCOUNTER — Encounter: Payer: Self-pay | Admitting: Psychiatry

## 2023-05-30 ENCOUNTER — Telehealth (INDEPENDENT_AMBULATORY_CARE_PROVIDER_SITE_OTHER): Payer: BC Managed Care – PPO | Admitting: Psychiatry

## 2023-05-30 DIAGNOSIS — F640 Transsexualism: Secondary | ICD-10-CM | POA: Diagnosis not present

## 2023-05-30 DIAGNOSIS — Z79899 Other long term (current) drug therapy: Secondary | ICD-10-CM

## 2023-05-30 DIAGNOSIS — R635 Abnormal weight gain: Secondary | ICD-10-CM

## 2023-05-30 DIAGNOSIS — F4010 Social phobia, unspecified: Secondary | ICD-10-CM | POA: Diagnosis not present

## 2023-05-30 DIAGNOSIS — T50905A Adverse effect of unspecified drugs, medicaments and biological substances, initial encounter: Secondary | ICD-10-CM

## 2023-05-30 DIAGNOSIS — F3342 Major depressive disorder, recurrent, in full remission: Secondary | ICD-10-CM | POA: Diagnosis not present

## 2023-05-30 MED ORDER — ARIPIPRAZOLE 2 MG PO TABS: 2.0000 mg | ORAL_TABLET | Freq: Every day | ORAL | 0 refills | Status: DC

## 2023-05-30 NOTE — Progress Notes (Signed)
Virtual Visit via Video Note  I connected with Katherine Steele on 05/30/23 at  2:30 PM EDT by a video enabled telemedicine application and verified that I am speaking with the correct person using two identifiers.  Location Provider Location : [facility] Patient Location : Car  Participants: Patient , Provider   I discussed the limitations of evaluation and management by telemedicine and the availability of in person appointments. The patient expressed understanding and agreed to proceed.   I discussed the assessment and treatment plan with the patient. The patient was provided an opportunity to ask questions and all were answered. The patient agreed with the plan and demonstrated an understanding of the instructions.   The patient was advised to call back or seek an in-person evaluation if the symptoms worsen or if the condition fails to improve as anticipated.  BH MD OP Progress Note  05/31/2023 8:24 AM Katherine Steele  MRN:  [id number]  Chief Complaint:  Chief Complaint  Patient presents with   Follow-up   Anxiety   Depression   Medication Refill   HPI: Katherine Steele is a 52 year old Caucasian female, married, employed, lives in [place], has a history of MDD, social anxiety, gender dysphoria, long-term use of cannabis was evaluated by telemedicine today.  Patient reports she is currently on a lower dosage of venlafaxine.  She is tolerating it better.  Patient no longer taking the metformin since she had GI problems/diarrhea from it.  She is tapering down the Abilify herself.  Currently only taking 1/2 tablet of the 5 mg.  Overall she is doing fairly well on the current combination of medication.  Denies any significant sadness.  Denies any significant anxiety.  She reports her relationship problems with her spouse is improving.  She hence does not believe she needs family therapist at this time.  She however still continues to be motivated to have individual therapy.  She reports she  did not feel the therapist that she recently met was a good fit hence planning to reach out to [facility], she has contact information for this therapist.  Patient reports sleep as good.  Patient reports appetite is fair.  Denies any suicidality, homicidality or perceptual disturbances.  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  Looks forward to that.  Patient denies any other concerns today.  Visit Diagnosis:    ICD-10-CM   1. MDD (major depressive disorder), recurrent, in full remission (HCC)  F33.42 ARIPiprazole (ABILIFY) 2 MG tablet    2. Social anxiety disorder  F40.10     3. Gender dysphoria in adult  F64.0     4. High risk medication use  Z79.899     5. Weight gain due to medication  R63.5    T50.905A    Due to antipsychotics      Past Psychiatric History: I have reviewed past psychiatric history from progress note on 11/30/2022.  Past trials of medications like Zoloft, Wellbutrin-made her crazy, Xanax. Patient with recent suicide attempt-inpatient behavioral health admission-June 01/18/2023-01/19/2023-after an overdose on 30 pills of propranolol 10 mg . Patient also completed PHP/MH IOP(03/18/2023)  Past Medical History:  Past Medical History:  Diagnosis Date   BMI 33.0-33.9,adult    Endometriosis    s/p hysterectomy   GAD (generalized anxiety disorder)    Dr. Lucianne Muss psychiatrist   Gender dysphoria 2014   did undergo 10 mo testosterone treatment   GERD (gastroesophageal reflux disease)  HLD (hyperlipidemia) 08/12/2014   Mild off meds    MDD (major depressive disorder)    Dr. Lucianne Muss psychiatrist   PONV (postoperative nausea and vomiting)    after finger surgery    Past Surgical History:  Procedure Laterality Date   CESAREAN SECTION     COLONOSCOPY  2007   WNL [facility])   COLONOSCOPY WITH PROPOFOL N/A 03/31/2021   WNL, rpt 10 yrs [name], [place] B, MD)   ESOPHAGOGASTRODUODENOSCOPY (EGD) WITH  PROPOFOL N/A 03/21/2018   barrett's without dysplasia, mild chronic gastritis, reflux gastroesophagitis [name], [name], MD)   ESOPHAGOGASTRODUODENOSCOPY (EGD) WITH PROPOFOL N/A 03/31/2021   small HH, shatski ring, ?barretts - biopsies WNL [name], Michel Bickers B, MD)   FINGER SURGERY Right 2007   little finger   OOPHORECTOMY Left 2006   TONSILLECTOMY  1975   TOTAL ABDOMINAL HYSTERECTOMY  12/2012   endometriosis and ovarian cysts s/p ovaries removed   TYMPANOSTOMY TUBE PLACEMENT Bilateral 1976    Family Psychiatric History: I have reviewed family psychiatric history from progress note on 11/30/2022.  Family History:  Family History  Problem Relation Age of Onset   Diabetes Mother    Cancer Mother 42       ovarian with mets   Rheum arthritis Mother    Hypertension Father    Alcohol abuse Father        history   Rheum arthritis Maternal Aunt    CAD Maternal Uncle 13       MI   Depression Paternal Uncle    Alcohol abuse Paternal Uncle    Suicidality Paternal Uncle    CAD Maternal Grandfather 77       MI   Depression Cousin    Alcohol abuse Cousin    Suicidality Cousin    Drug abuse Cousin    Suicidality Cousin    Anxiety disorder Son    Autism spectrum disorder Son    Stroke Neg Hx     Social History: I have reviewed social history from progress note on 11/30/2022. Social History   Socioeconomic History   Marital status: Married    Spouse name: Katherine Steele   Number of children: 1   Years of education: Not on file   Highest education level: Associate degree: academic program  Occupational History   Not on file  Tobacco Use   Smoking status: Former    Types: Cigarettes    Quit date: 02/06/1993    Years since quitting: 30.3   Smokeless tobacco: Never  Vaping Use   Vaping Use: Never used  Substance and Sexual Activity   Alcohol use: Not Currently    Comment: social/weekends   Drug use: Not Currently    Types: Marijuana    Comment: stopped 2 weeks ago smoking  marijuana   Sexual activity: Yes  Other Topics Concern   Not on file  Social History Narrative   Lives with husband Katherine Steele) and son Katherine Steele)   Occupation: Lexicographer   Edu: Associate's degree   Activity: exercise 3x/wk   Diet: good water, fruits/vegetables some   Social Determinants of Health   Financial Resource Strain: Not on file  Food Insecurity: No Food Insecurity (01/18/2023)   Hunger Vital Sign    Worried About Running Out of Food in the Last Year: Never true    Ran Out of Food in the Last Year: Never true  Transportation Needs: No Transportation Needs (01/18/2023)   PRAPARE - Administrator, Civil Service (Medical): No  Lack of Transportation (Non-Medical): No  Physical Activity: Not on file  Stress: Not on file  Social Connections: Not on file    Allergies: No Known Allergies  Metabolic Disorder Labs: Lab Results  Component Value Date   HGBA1C 5.3 05/27/2023   MPG 105.41 05/27/2023   No results found for: "PROLACTIN" Lab Results  Component Value Date   CHOL 236 (H) 01/16/2023   TRIG 142.0 01/16/2023   HDL 58.70 01/16/2023   CHOLHDL 4 01/16/2023   VLDL 28.4 01/16/2023   LDLCALC 149 (H) 01/16/2023   LDLCALC 125 (H) 12/29/2021   Lab Results  Component Value Date   TSH 1.793 12/20/2022   TSH 2.88 06/26/2021    Therapeutic Level Labs: No results found for: "LITHIUM" No results found for: "VALPROATE" No results found for: "CBMZ"  Current Medications: Current Outpatient Medications  Medication Sig Dispense Refill   ARIPiprazole (ABILIFY) 2 MG tablet Take 1 tablet (2 mg total) by mouth daily. 90 tablet 0   omeprazole (PRILOSEC) 40 MG capsule Take 1 capsule (40 mg total) by mouth daily. 30 capsule 0   rosuvastatin (CRESTOR) 10 MG tablet Take 1 tablet (10 mg total) by mouth daily after supper. 90 tablet 3   venlafaxine XR (EFFEXOR XR) 75 MG 24 hr capsule Take 1 capsule (75 mg total) by mouth daily with breakfast. 30 capsule 1   No  current facility-administered medications for this visit.     Musculoskeletal: Strength & Muscle Tone:  UTA Gait & Station:  Seated Patient leans: N/A  Psychiatric Specialty Exam: Review of Systems  Psychiatric/Behavioral: Negative.      Last menstrual period 07/17/2012.There is no height or weight on file to calculate BMI.  General Appearance: Casual  Eye Contact:  Fair  Speech:  Clear and Coherent  Volume:  Normal  Mood:  Euthymic  Affect:  Congruent  Thought Process:  Goal Directed and Descriptions of Associations: Intact  Orientation:  Full (Time, Place, and Person)  Thought Content: Logical   Suicidal Thoughts:  No  Homicidal Thoughts:  No  Memory:  Immediate;   Fair Recent;   Fair Remote;   Fair  Judgement:  Fair  Insight:  Fair  Psychomotor Activity:  Normal  Concentration:  Concentration: Fair and Attention Span: Fair  Recall:  [employer] of Knowledge: Fair  Language: Fair  Akathisia:  No  Handed:  Right  AIMS (if indicated): not done  Assets:  Communication Skills Desire for Improvement Housing Social Support  ADL's:  Intact  Cognition: WNL  Sleep:  Fair   Screenings: AIMS    Flowsheet Row Office Visit from 04/03/2023 in [facility] Admission (Discharged) from 01/18/2023 in [facility]AV[facility] MEDICINE  AIMS Total Score 0 0      AUDIT    Flowsheet Row Admission (Discharged) from 01/18/2023 in [facility] INPATIENT BEHAVIORAL MEDICINE  Alcohol Use Disorder Identification Test Final Score (AUDIT) 0      GAD-7    Flowsheet Row Office Visit from 04/03/2023 in [facility] Counselor from 01/24/2023 in BEHAVIORAL HEALTH PARTIAL HOSPITALIZATION PROGRAM Office Visit from 01/23/2023 in [facility] Office Visit from 01/17/2023 in [facility] Office Visit from 01/16/2023 in [facility]  Total GAD-7 Score 2 13 9 13 14       PHQ2-9    Flowsheet Row Office Visit from 04/03/2023 in [facility]  Counselor from 02/25/2023 in BEHAVIORAL HEALTH INTENSIVE PSYCH Counselor from 02/01/2023 in BEHAVIORAL HEALTH PARTIAL HOSPITALIZATION PROGRAM Counselor from 01/24/2023 in BEHAVIORAL HEALTH PARTIAL HOSPITALIZATION PROGRAM Office Visit from 01/23/2023 in [facility]  PHQ-2 Total Score 1 3 2 4 5   PHQ-9 Total Score 4 14 8 15 16       Flowsheet Row Video Visit from 05/30/2023 in [facility] Office Visit from 04/03/2023 in [facility] Counselor from 02/25/2023 in BEHAVIORAL HEALTH INTENSIVE PSYCH  C-SSRS RISK CATEGORY Moderate Risk High Risk Error: Question 6 not populated        Assessment and Plan: VESSICA TIEDEMANN is a 52 year old female, employed, has a history of depression, anxiety, gender dysphoria was evaluated by telemedicine today.  Patient is currently stable, will benefit from the following plan.  Plan MDD in remission Venlafaxine extended release 75 mg p.o. daily.  Did not tolerate the higher dosage. Reduce Abilify to 2 mg p.o. daily.  Long-term plan to taper it off.  Social anxiety disorder-improving Patient advised to continue CBT, and motivated to reach out to therapist. Venlafaxine as prescribed  Gender dysphoria-improving Referred for CBT  Weight gain likely due to medication-psychotropic-improving Patient stopped metformin, had side effects. Patient to continue diet management, exercise.  High risk medication use-pending hemoglobin A1c.  However since patient is being tapered off of the Abilify she could hold off.  Follow-up in clinic in 2 to 3 months or sooner if needed.    Collaboration of Care: Collaboration of Care: Referral or follow-up with counselor/therapist AEB encouraged to establish  care with therapist.  Patient/Guardian was advised Release of Information must be obtained prior to any record release in order to collaborate their care with an outside provider. Patient/Guardian was advised if they have not already done so to contact the registration department to sign all necessary forms in order for [name] to release information regarding their care.   Consent: Patient/Guardian gives verbal consent for treatment and assignment of benefits for services provided during this visit. Patient/Guardian expressed understanding and agreed to proceed.   This note was generated in part or whole with voice recognition software. Voice recognition is usually quite accurate but there are transcription errors that can and very often do occur. I apologize for any typographical errors that were not detected and corrected.    Jomarie Longs, MD 05/31/2023, 8:24 AM

## 2023-06-04 ENCOUNTER — Ambulatory Visit: Payer: BC Managed Care – PPO | Admitting: Psychiatry

## 2023-06-21 DIAGNOSIS — F332 Major depressive disorder, recurrent severe without psychotic features: Secondary | ICD-10-CM

## 2023-06-21 MED ORDER — VENLAFAXINE HCL ER 75 MG PO CP24: 75.0000 mg | ORAL_CAPSULE | Freq: Every day | ORAL | 0 refills | Status: DC

## 2023-06-26 ENCOUNTER — Encounter: Payer: Self-pay | Admitting: Family Medicine

## 2023-07-02 ENCOUNTER — Other Ambulatory Visit: Payer: Self-pay | Admitting: Psychiatry

## 2023-07-02 DIAGNOSIS — F411 Generalized anxiety disorder: Secondary | ICD-10-CM

## 2023-07-02 DIAGNOSIS — F332 Major depressive disorder, recurrent severe without psychotic features: Secondary | ICD-10-CM

## 2023-07-18 ENCOUNTER — Encounter: Payer: Self-pay | Admitting: Family Medicine

## 2023-07-18 DIAGNOSIS — K2100 Gastro-esophageal reflux disease with esophagitis, without bleeding: Secondary | ICD-10-CM

## 2023-07-18 MED ORDER — OMEPRAZOLE 40 MG PO CPDR: 40.0000 mg | DELAYED_RELEASE_CAPSULE | Freq: Every day | ORAL | 1 refills | Status: DC

## 2023-07-18 NOTE — Telephone Encounter (Signed)
Deleted [facility] from [facility] pharmacy list.

## 2023-07-18 NOTE — Telephone Encounter (Signed)
E-scribed 90-day refill to [facility] [address]

## 2023-07-29 ENCOUNTER — Telehealth: Payer: Self-pay | Admitting: Psychiatry

## 2023-07-29 NOTE — Telephone Encounter (Signed)
I have completed the form for clearance from [employer].

## 2023-07-29 NOTE — Telephone Encounter (Signed)
I have completed the defense counter intelligence clearance from, provided to staff to fax this.

## 2023-08-20 ENCOUNTER — Other Ambulatory Visit: Payer: Self-pay | Admitting: Psychiatry

## 2023-08-20 DIAGNOSIS — F3342 Major depressive disorder, recurrent, in full remission: Secondary | ICD-10-CM

## 2023-08-27 ENCOUNTER — Telehealth: Payer: 59 | Admitting: Psychiatry

## 2023-08-27 ENCOUNTER — Encounter: Payer: Self-pay | Admitting: Psychiatry

## 2023-08-27 DIAGNOSIS — F640 Transsexualism: Secondary | ICD-10-CM

## 2023-08-27 DIAGNOSIS — F4010 Social phobia, unspecified: Secondary | ICD-10-CM

## 2023-08-27 DIAGNOSIS — F3342 Major depressive disorder, recurrent, in full remission: Secondary | ICD-10-CM

## 2023-08-27 MED ORDER — LAMOTRIGINE 25 MG PO TABS: ORAL_TABLET | ORAL | 0 refills | Status: DC

## 2023-08-27 NOTE — Progress Notes (Unsigned)
Virtual Visit via Video Note  I connected with Katherine Steele on 08/27/23 at 11:30 AM EDT by a video enabled telemedicine application and verified that I am speaking with the correct person using two identifiers.  Location Provider Location : [facility] Patient Location : Home  Participants: Patient , Provider    I discussed the limitations of evaluation and management by telemedicine and the availability of in person appointments. The patient expressed understanding and agreed to proceed.   I discussed the assessment and treatment plan with the patient. The patient was provided an opportunity to ask questions and all were answered. The patient agreed with the plan and demonstrated an understanding of the instructions.   The patient was advised to call back or seek an in-person evaluation if the symptoms worsen or if the condition fails to improve as anticipated.   BH MD OP Progress Note  08/27/2023 11:52 AM Katherine Steele  MRN:  [id number]  Chief Complaint:  Chief Complaint  Patient presents with   Follow-up   Depression   Anxiety   Medication Refill   HPI: Katherine Steele is a 52 year old Caucasian female, married, employed, lives in [place], has a history of MDD, social anxiety disorder, gender dysphoria, long-term use of cannabis was evaluated by telemedicine today.  Patient today reports she is currently struggling with mood swings.  She reports she stopped taking the Abilify since she was worried about the weight gain side effects.  Since stopping the Abilify she has been struggling with mood swings on a regular basis.  She continues to be compliant on venlafaxine 5 mg.  She had side effects to higher dosages of venlafaxine .  Patient reports sleep is overall good.  She currently works at a new job with [employer].  She reports she is on second shift and works from 4 PM to 2 AM.  Patient reports when she comes back home she is able to wind down and fall asleep without any  difficulty.  Reports she likes her new job.  Patient denies any suicidality at this time.  Denies any homicidality or perceptual disturbances.  She has not been able to reach out to her therapist yet however is motivated to do so.  Patient with movements around her mouth, however reports she has had it for a long time and does not believe it is secondary to her medications like Abilify which she was recently on.  Patient denies any other side effects.  Patient denies any other concerns today.  Visit Diagnosis:    ICD-10-CM   1. MDD (major depressive disorder), recurrent, in full remission (HCC)  F33.42 lamoTRIgine (LAMICTAL) 25 MG tablet    2. Social anxiety disorder  F40.10     3. Gender dysphoria in adult  F64.0       Past Psychiatric History: I have reviewed past psychiatric history from progress note on 11/30/2022.  The medications like Zoloft, Wellbutrin-'made her crazy', Xanax Patient with recent suicide attempt-inpatient behavioral health admission-01/18/2023-01/19/2023-after an overdose on 30 pills of propranolol 10 mg. Patient also completed PHP/MH IOP-03/18/2023.  Past Medical History:  Past Medical History:  Diagnosis Date   BMI 33.0-33.9,adult    Endometriosis    s/p hysterectomy   GAD (generalized anxiety disorder)    Dr. Lucianne Muss psychiatrist   Gender dysphoria 2014   did undergo 10 mo testosterone treatment   GERD (gastroesophageal reflux disease)    HLD (hyperlipidemia) 08/12/2014   Mild off meds    MDD (major depressive  disorder)    Dr. Lucianne Muss psychiatrist   PONV (postoperative nausea and vomiting)    after finger surgery    Past Surgical History:  Procedure Laterality Date   CESAREAN SECTION     COLONOSCOPY  2007   WNL [facility]   COLONOSCOPY WITH PROPOFOL N/A 03/31/2021   WNL, rpt 10 yrs [name], [place] B, MD)   ESOPHAGOGASTRODUODENOSCOPY (EGD) WITH PROPOFOL N/A 03/21/2018   barrett's without dysplasia, mild chronic gastritis, reflux gastroesophagitis  [name], [name], MD)   ESOPHAGOGASTRODUODENOSCOPY (EGD) WITH PROPOFOL N/A 03/31/2021   small HH, shatski ring, ?barretts - biopsies WNL [name], Michel Bickers B, MD)   FINGER SURGERY Right 2007   little finger   OOPHORECTOMY Left 2006   TONSILLECTOMY  1975   TOTAL ABDOMINAL HYSTERECTOMY  12/2012   endometriosis and ovarian cysts s/p ovaries removed   TYMPANOSTOMY TUBE PLACEMENT Bilateral 1976    Family Psychiatric History: I have reviewed family psychiatric history from progress note on 11/30/2022.  Family History:  Family History  Problem Relation Age of Onset   Diabetes Mother    Cancer Mother 91       ovarian with mets   Rheum arthritis Mother    Hypertension Father    Alcohol abuse Father        history   Rheum arthritis Maternal Aunt    CAD Maternal Uncle 42       MI   Depression Paternal Uncle    Alcohol abuse Paternal Uncle    Suicidality Paternal Uncle    CAD Maternal Grandfather 15       MI   Depression Cousin    Alcohol abuse Cousin    Suicidality Cousin    Drug abuse Cousin    Suicidality Cousin    Anxiety disorder Son    Autism spectrum disorder Son    Stroke Neg Hx     Social History: I have reviewed social history from progress note on 11/30/2022. Social History   Socioeconomic History   Marital status: Married    Spouse name: michael   Number of children: 1   Years of education: Not on file   Highest education level: Associate degree: academic program  Occupational History   Not on file  Tobacco Use   Smoking status: Former    Current packs/day: 0.00    Types: Cigarettes    Quit date: 02/06/1993    Years since quitting: 30.5   Smokeless tobacco: Never  Vaping Use   Vaping status: Never Used  Substance and Sexual Activity   Alcohol use: Not Currently    Comment: social/weekends   Drug use: Not Currently    Types: Marijuana    Comment: stopped 2 weeks ago smoking marijuana   Sexual activity: Yes  Other Topics Concern   Not on file   Social History Narrative   Lives with husband Katherine November) and son Katherine Steele)   Occupation: Lexicographer   Edu: Associate's degree   Activity: exercise 3x/wk   Diet: good water, fruits/vegetables some   Social Determinants of Health   Financial Resource Strain: Not on file  Food Insecurity: No Food Insecurity (01/18/2023)   Hunger Vital Sign    Worried About Running Out of Food in the Last Year: Never true    Ran Out of Food in the Last Year: Never true  Transportation Needs: No Transportation Needs (01/18/2023)   PRAPARE - Administrator, Civil Service (Medical): No    Lack of Transportation (Non-Medical): No  Physical  Activity: Not on file  Stress: Not on file  Social Connections: Unknown (09/26/2022)   [facility]   Social Network    Social Network: Not on file    Allergies: No Known Allergies  Metabolic Disorder Labs: Lab Results  Component Value Date   HGBA1C 5.3 05/27/2023   MPG 105.41 05/27/2023   No results found for: "PROLACTIN" Lab Results  Component Value Date   CHOL 236 (H) 01/16/2023   TRIG 142.0 01/16/2023   HDL 58.70 01/16/2023   CHOLHDL 4 01/16/2023   VLDL 28.4 01/16/2023   LDLCALC 149 (H) 01/16/2023   LDLCALC 125 (H) 12/29/2021   Lab Results  Component Value Date   TSH 1.793 12/20/2022   TSH 2.88 06/26/2021    Therapeutic Level Labs: No results found for: "LITHIUM" No results found for: "VALPROATE" No results found for: "CBMZ"  Current Medications: Current Outpatient Medications  Medication Sig Dispense Refill   lamoTRIgine (LAMICTAL) 25 MG tablet Take 1 tablet (25 mg total) by mouth daily for 15 days, THEN 1 tablet (25 mg total) 2 (two) times daily for 15 days. 45 tablet 0   omeprazole (PRILOSEC) 40 MG capsule Take 1 capsule (40 mg total) by mouth daily. 90 capsule 1   rosuvastatin (CRESTOR) 10 MG tablet Take 1 tablet (10 mg total) by mouth daily after supper. 90 tablet 3   venlafaxine XR (EFFEXOR  XR) 75 MG 24 hr capsule Take 1 capsule (75 mg total) by mouth daily with breakfast. 90 capsule 0   No current facility-administered medications for this visit.     Musculoskeletal: Strength & Muscle Tone:  UTA Gait & Station:  Seated Patient leans: N/A  Psychiatric Specialty Exam: Review of Systems  Psychiatric/Behavioral:  Positive for dysphoric mood.     Last menstrual period 07/17/2012.There is no height or weight on file to calculate BMI.  General Appearance: Fairly Groomed  Eye Contact:  Fair  Speech:  Clear and Coherent  Volume:  Normal  Mood:  Depressed  Affect:  Congruent  Thought Process:  Goal Directed and Descriptions of Associations: Intact  Orientation:  Full (Time, Place, and Person)  Thought Content: Logical   Suicidal Thoughts:  No  Homicidal Thoughts:  No  Memory:  Immediate;   Fair Recent;   Fair Remote;   Fair  Judgement:  Fair  Insight:  Fair  Psychomotor Activity:  Normal  Concentration:  Concentration: Fair and Attention Span: Fair  Recall:  [employer] of Knowledge: Fair  Language: Fair  Akathisia:  No  Handed:  Right  AIMS (if indicated): not done  Assets:  Communication Skills Desire for Improvement Housing Social Support  ADL's:  Intact  Cognition: WNL  Sleep:  Fair   Screenings: AIMS    Flowsheet Row Office Visit from 04/03/2023 in [facility] Admission (Discharged) from 01/18/2023 in [facility] INPATIENT BEHAVIORAL MEDICINE  AIMS Total Score 0 0      AUDIT    Flowsheet Row Admission (Discharged) from 01/18/2023 in [facility] INPATIENT BE[facility]AL MEDICINE  Alcohol Use Disorder Identification Test Final Score (AUDIT) 0      GAD-7    Flowsheet Row Office Visit from 04/03/2023 in [facility] Counselor from 01/24/2023 in BEHAVIORAL HEALTH PARTIAL HOSPITALIZATION PROGRAM Office Visit from 01/23/2023 in [facility] Office Visit from  01/17/2023 in [facility] Office Visit from 01/16/2023 in [facility] [place]  Total GAD-7 Score 2 13 9 13 14       PHQ2-9    Flowsheet Row Office Visit from 04/03/2023 in [facility] Counselor from 02/25/2023 in BEHAVIORAL HEALTH INTENSIVE PSYCH Counselor from 02/01/2023 in BEHAVIORAL HEALTH PARTIAL HOSPITALIZATION PROGRAM Counselor from 01/24/2023 in BEHAVIORAL HEALTH PARTIAL HOSPITALIZATION PROGRAM Office Visit from 01/23/2023 in [facility]  PHQ-2 Total Score 1 3 2 4 5   PHQ-9 Total Score 4 14 8 15 16       Flowsheet Row Video Visit from 08/27/2023 in [facility] Video Visit from 05/30/2023 in [facility] Office Visit from 04/03/2023 in [facility]  C-SSRS RISK CATEGORY No Risk Moderate Risk High Risk        Assessment and Plan: DARWIN CALDRON is a 52 year old female, employed, has a history of depression, anxiety, gender dysphoria was evaluated by telemedicine today.  Patient is currently struggling with depression symptoms, mostly mood swings, noncompliant on Abilify due to concerns about weight gain side effects, will benefit from addition of a mood stabilizer like Lamictal, discussed plan as noted below.  Plan MDD-mild-unstable Venlafaxine extended release 75 mg p.o. daily.  Patient did not tolerate higher dosages. Discontinue Abilify-noncompliant. Start Lamictal 25 mg p.o. daily for 15 days and increase to 25 mg p.o. twice daily after 15 days.  Social anxiety disorder-improving Patient advised to reestablish care with therapist.  Patient agrees to reach out to her previous therapist Ms. Courtney. Venlafaxine as prescribed  Gender dysphoria-improving Patient to continue CBT as needed     Collaboration of Care:  Collaboration of Care: Referral or follow-up with counselor/therapist AEB patient encouraged to reestablish care with therapist  Patient/Guardian was advised Release of Information must be obtained prior to any record release in order to collaborate their care with an outside provider. Patient/Guardian was advised if they have not already done so to contact the registration department to sign all necessary forms in order for [name] to release information regarding their care.   Consent: Patient/Guardian gives verbal consent for treatment and assignment of benefits for services provided during this visit. Patient/Guardian expressed understanding and agreed to proceed.  Follow-up in clinic in 4 weeks or sooner if needed.  This note was generated in part or whole with voice recognition software. Voice recognition is usually quite accurate but there are transcription errors that can and very often do occur. I apologize for any typographical errors that were not detected and corrected.     Jomarie Longs, MD 08/28/2023, 5:02 PM

## 2023-09-12 ENCOUNTER — Other Ambulatory Visit: Payer: Self-pay | Admitting: Psychiatry

## 2023-09-12 DIAGNOSIS — F332 Major depressive disorder, recurrent severe without psychotic features: Secondary | ICD-10-CM

## 2023-09-23 ENCOUNTER — Other Ambulatory Visit: Payer: Self-pay | Admitting: Psychiatry

## 2023-09-23 DIAGNOSIS — F3342 Major depressive disorder, recurrent, in full remission: Secondary | ICD-10-CM

## 2023-09-24 ENCOUNTER — Telehealth (INDEPENDENT_AMBULATORY_CARE_PROVIDER_SITE_OTHER): Payer: 59 | Admitting: Psychiatry

## 2023-09-24 ENCOUNTER — Encounter: Payer: Self-pay | Admitting: Psychiatry

## 2023-09-24 DIAGNOSIS — F640 Transsexualism: Secondary | ICD-10-CM | POA: Diagnosis not present

## 2023-09-24 DIAGNOSIS — F4010 Social phobia, unspecified: Secondary | ICD-10-CM

## 2023-09-24 DIAGNOSIS — F3342 Major depressive disorder, recurrent, in full remission: Secondary | ICD-10-CM | POA: Diagnosis not present

## 2023-09-24 MED ORDER — VENLAFAXINE HCL ER 75 MG PO CP24: 75.0000 mg | ORAL_CAPSULE | Freq: Every day | ORAL | 1 refills | Status: DC

## 2023-09-24 NOTE — Progress Notes (Signed)
Virtual Visit via Video Note  I connected with Katherine Steele on 09/24/23 at 11:30 AM EDT by a video enabled telemedicine application and verified that I am speaking with the correct person using two identifiers.  Location Provider Location : [facility] Patient Location : Home  Participants: Patient , Provider    I discussed the limitations of evaluation and management by telemedicine and the availability of in person appointments. The patient expressed understanding and agreed to proceed.   I discussed the assessment and treatment plan with the patient. The patient was provided an opportunity to ask questions and all were answered. The patient agreed with the plan and demonstrated an understanding of the instructions.   The patient was advised to call back or seek an in-person evaluation if the symptoms worsen or if the condition fails to improve as anticipated.  BH MD OP Progress Note  09/24/2023 12:33 PM Katherine Steele  MRN:  [id number]  Chief Complaint:  Chief Complaint  Patient presents with   Follow-up   Depression   Anxiety   Medication Refill   HPI: Katherine Steele is a 52 year old Caucasian female, married, employed, lives in [place], has a history of MDD, social anxiety disorder, gender dysphoria, long-term use of cannabis was evaluated by telemedicine today.  Patient today reports she is currently doing well with regards to her mood symptoms.  She is currently taking the Lamictal 50 mg daily.  She reports the higher dosage is making her drowsy.  She felt better when she was taking the 25 mg.  She however is worried about reducing the dosage since she does not want her mood symptoms to get worse.  She continues to be compliant on the venlafaxine.  Patient denies any side effects.  She reports she is getting used to her new work schedule.  She loves her job.  She reports she likes second shift better than first shift.  She works from 4 PM to 2 AM.  She is able to go to bed by  around 3:30 AM and is able to stay asleep until 11 AM.  She hence reports sleep as good.  Patient reports she continues to follow up with her therapist, Katherine Steele regular basis.  Therapy sessions are beneficial.  Patient denies any suicidality, homicidality or perceptual disturbances.  Patient denies any other concerns today.  Visit Diagnosis:    ICD-10-CM   1. MDD (major depressive disorder), recurrent, in full remission (HCC)  F33.42 venlafaxine XR (EFFEXOR-XR) 75 MG 24 hr capsule    2. Social anxiety disorder  F40.10     3. Gender dysphoria in adult  F64.0       Past Psychiatric History: I have reviewed past psychiatric history from progress note on 11/30/2022.  Past trials of medications like Zoloft, Wellbutrin-' made her crazy', Xanax.  Patient with recent suicide attempt-inpatient behavioral health admission-01/18/2023 - 01/19/2023-after an overdose on 30 pills of propranolol 10 mg.  Patient completed PHP/MH IOP-03/18/2023.  Past Medical History:  Past Medical History:  Diagnosis Date   BMI 33.0-33.9,adult    Endometriosis    s/p hysterectomy   GAD (generalized anxiety disorder)    Katherine Steele psychiatrist   Gender dysphoria 2014   did undergo 10 mo testosterone treatment   GERD (gastroesophageal reflux disease)    HLD (hyperlipidemia) 08/12/2014   Mild off meds    MDD (major depressive disorder)    Katherine Steele psychiatrist   PONV (postoperative nausea and vomiting)    after finger  surgery    Past Surgical History:  Procedure Laterality Date   CESAREAN SECTION     COLONOSCOPY  2007   WNL [facility])   COLONOSCOPY WITH PROPOFOL N/A 03/31/2021   WNL, rpt 10 yrs [name], [place] B, MD)   ESOPHAGOGASTRODUODENOSCOPY (EGD) WITH PROPOFOL N/A 03/21/2018   barrett's without dysplasia, mild chronic gastritis, reflux gastroesophagitis [name], [name], MD)   ESOPHAGOGASTRODUODENOSCOPY (EGD) WITH PROPOFOL N/A 03/31/2021   small HH, shatski ring, ?barretts - biopsies WNL  [name], [name], MD)   FINGER SURGERY Right 2007   little finger   OOPHORECTOMY Left 2006   TONSILLECTOMY  1975   TOTAL ABDOMINAL HYSTERECTOMY  12/2012   endometriosis and ovarian cysts s/p ovaries removed   TYMPANOSTOMY TUBE PLACEMENT Bilateral 1976    Family Psychiatric History: I have reviewed family psychiatric history from progress note on 11/30/2022.  Family History:  Family History  Problem Relation Age of Onset   Diabetes Mother    Cancer Mother 80       ovarian with mets   Rheum arthritis Mother    Hypertension Father    Alcohol abuse Father        history   Rheum arthritis Maternal Aunt    CAD Maternal Uncle 56       MI   Depression Paternal Uncle    Alcohol abuse Paternal Uncle    Suicidality Paternal Uncle    CAD Maternal Grandfather 63       MI   Depression Cousin    Alcohol abuse Cousin    Suicidality Cousin    Drug abuse Cousin    Suicidality Cousin    Anxiety disorder Son    Autism spectrum disorder Son    Stroke Neg Hx     Social History: I have reviewed social history from progress note on 11/30/2022. Social History   Socioeconomic History   Marital status: Married    Spouse name: Katherine Steele   Number of children: 1   Years of education: Not on file   Highest education level: Associate degree: academic program  Occupational History   Not on file  Tobacco Use   Smoking status: Former    Current packs/day: 0.00    Types: Cigarettes    Quit date: 02/06/1993    Years since quitting: 30.6   Smokeless tobacco: Never  Vaping Use   Vaping status: Never Used  Substance and Sexual Activity   Alcohol use: Not Currently    Comment: social/weekends   Drug use: Not Currently    Types: Marijuana    Comment: stopped 2 weeks ago smoking marijuana   Sexual activity: Yes  Other Topics Concern   Not on file  Social History Narrative   Lives with husband Katherine Steele) and son Katherine Steele)   Occupation: Lexicographer   Edu: Associate's degree    Activity: exercise 3x/wk   Diet: good water, fruits/vegetables some   Social Determinants of Health   Financial Resource Strain: Not on file  Food Insecurity: No Food Insecurity (01/18/2023)   Hunger Vital Sign    Worried About Running Out of Food in the Last Year: Never true    Ran Out of Food in the Last Year: Never true  Transportation Needs: No Transportation Needs (01/18/2023)   PRAPARE - Administrator, Civil Service (Medical): No    Lack of Transportation (Non-Medical): No  Physical Activity: Not on file  Stress: Not on file  Social Connections: Unknown (09/26/2022)   [facility], [facility]   Social Network    Social Network: Not on file    Allergies: No Known Allergies  Metabolic Disorder Labs: Lab Results  Component Value Date   HGBA1C 5.3 05/27/2023   MPG 105.41 05/27/2023   No results found for: "PROLACTIN" Lab Results  Component Value Date   CHOL 236 (H) 01/16/2023   TRIG 142.0 01/16/2023   HDL 58.70 01/16/2023   CHOLHDL 4 01/16/2023   VLDL 28.4 01/16/2023   LDLCALC 149 (H) 01/16/2023   LDLCALC 125 (H) 12/29/2021   Lab Results  Component Value Date   TSH 1.793 12/20/2022   TSH 2.88 06/26/2021    Therapeutic Level Labs: No results found for: "LITHIUM" No results found for: "VALPROATE" No results found for: "CBMZ"  Current Medications: Current Outpatient Medications  Medication Sig Dispense Refill   lamoTRIgine (LAMICTAL) 25 MG tablet Take 1 tablet (25 mg total) by mouth 2 (two) times daily. 60 tablet 0   omeprazole (PRILOSEC) 40 MG capsule Take 1 capsule (40 mg total) by mouth daily. 90 capsule 1   rosuvastatin (CRESTOR) 10 MG tablet Take 1 tablet (10 mg total) by mouth daily after supper. 90 tablet 3   venlafaxine XR (EFFEXOR-XR) 75 MG 24 hr capsule Take 1 capsule (75 mg total) by mouth daily with breakfast. 90 capsule 1   No current facility-administered medications for this visit.     Musculoskeletal: Strength &  Muscle Tone:  UTA Gait & Station:  Seated Patient leans:  NA  Psychiatric Specialty Exam: Review of Systems  Psychiatric/Behavioral: Negative.      Last menstrual period 07/17/2012.There is no height or weight on file to calculate BMI.  General Appearance: Casual  Eye Contact:  Fair  Speech:  Clear and Coherent  Volume:  Normal  Mood:  Euthymic  Affect:  Congruent  Thought Process:  Goal Directed and Descriptions of Associations: Intact  Orientation:  Full (Time, Place, and Person)  Thought Content: Logical   Suicidal Thoughts:  No  Homicidal Thoughts:  No  Memory:  Immediate;   Fair Recent;   Fair Remote;   Fair  Judgement:  Fair  Insight:  Fair  Psychomotor Activity:  Normal  Concentration:  Concentration: Fair and Attention Span: Fair  Recall:  [employer] of Knowledge: Fair  Language: Fair  Akathisia:  No  Handed:  Right  AIMS (if indicated): not done  Assets:  Communication Skills Desire for Improvement Housing Social Support  ADL's:  Intact  Cognition: WNL  Sleep:  Fair   Screenings: AIMS    Flowsheet Row Office Visit from 04/03/2023 in [facility] Admission (Discharged) from 01/18/2023 in [facility] INPATIENT BEHAVIORAL MEDICINE  AIMS Total Score 0 0      AUDIT    Flowsheet Row Admission (Discharged) from 01/18/2023 in [facility] INPATIENT BEHAVIORAL MEDICINE  Alcohol Use Disorder Identification Test Final Score (AUDIT) 0      GAD-7    Flowsheet Row Office Visit from 04/03/2023 in [facility] Counselor from 01/24/2023 in BEHAVIORAL HEALTH PARTIAL HOSPITALIZATION PROGRAM Office Visit from 01/23/2023 in [facility] Office Visit from 01/17/2023 in [facility] Office Visit from 01/16/2023 in [facility]  Total GAD-7 Score 2 13 9 13 14       PHQ2-9    Flowsheet Row Office Visit  from 04/03/2023 in [facility] Counselor from 02/25/2023 in  BEHAVIORAL HEALTH INTENSIVE PSYCH Counselor from 02/01/2023 in BEHAVIORAL HEALTH PARTIAL HOSPITALIZATION PROGRAM Counselor from 01/24/2023 in BEHAVIORAL HEALTH PARTIAL HOSPITALIZATION PROGRAM Office Visit from 01/23/2023 in [facility]  PHQ-2 Total Score 1 3 2 4 5   PHQ-9 Total Score 4 14 8 15 16       Flowsheet Row Video Visit from 09/24/2023 in [facility] Video Visit from 08/27/2023 in [facility] Video Visit from 05/30/2023 in [facility]  C-SSRS RISK CATEGORY Moderate Risk No Risk Moderate Risk        Assessment and Plan: Katherine Steele is a 52 year old female, employed, currently improving on the current medication regimen although does report possible side effects to the higher dosage of Lamictal, plan as noted below.  Plan MDD in remission Venlafaxine extended release 75 mg p.o. daily. Patient advised to start taking Lamictal 25 mg p.o. daily in the morning and 25 mg at bedtime.  This likely will help with drowsiness from the higher dosage. Patient to reduce it back to Lamictal 25 mg daily if this does not work.  Social sightly disorder-improving Continue CBT with Katherine Steele. Venlafaxine extended release 75 mg p.o. daily  Gender dysphoria-improving Continue CBT as needed   Collaboration of Care: Collaboration of Care: Referral or follow-up with counselor/therapist AEB patient encouraged to continue CBT  Patient/Guardian was advised Release of Information must be obtained prior to any record release in order to collaborate their care with an outside provider. Patient/Guardian was advised if they have not already done so to contact the registration department to sign all necessary forms in order for [name] to release information  regarding their care.   Consent: Patient/Guardian gives verbal consent for treatment and assignment of benefits for services provided during this visit. Patient/Guardian expressed understanding and agreed to proceed.   Follow-up in clinic in 3 months or sooner in person.  This note was generated in part or whole with voice recognition software. Voice recognition is usually quite accurate but there are transcription errors that can and very often do occur. I apologize for any typographical errors that were not detected and corrected.    Jomarie Longs, MD 09/24/2023, 12:33 PM

## 2023-10-15 ENCOUNTER — Other Ambulatory Visit: Payer: Self-pay | Admitting: Family Medicine

## 2023-10-15 DIAGNOSIS — K2100 Gastro-esophageal reflux disease with esophagitis, without bleeding: Secondary | ICD-10-CM

## 2023-10-18 ENCOUNTER — Other Ambulatory Visit: Payer: Self-pay | Admitting: Family Medicine

## 2023-10-21 ENCOUNTER — Other Ambulatory Visit: Payer: Self-pay | Admitting: Psychiatry

## 2023-10-21 DIAGNOSIS — F3342 Major depressive disorder, recurrent, in full remission: Secondary | ICD-10-CM

## 2023-11-20 ENCOUNTER — Ambulatory Visit: Payer: 59 | Admitting: Psychiatry

## 2023-11-20 ENCOUNTER — Encounter: Payer: Self-pay | Admitting: Psychiatry

## 2023-11-20 VITALS — BP 139/82 | HR 58 | Temp 95.5°F | Ht 67.0 in | Wt 213.4 lb

## 2023-11-20 DIAGNOSIS — F640 Transsexualism: Secondary | ICD-10-CM

## 2023-11-20 DIAGNOSIS — F4010 Social phobia, unspecified: Secondary | ICD-10-CM

## 2023-11-20 DIAGNOSIS — F3342 Major depressive disorder, recurrent, in full remission: Secondary | ICD-10-CM | POA: Diagnosis not present

## 2023-11-20 MED ORDER — VENLAFAXINE HCL ER 37.5 MG PO CP24: 37.5000 mg | ORAL_CAPSULE | Freq: Every day | ORAL | 0 refills | Status: DC

## 2023-11-20 NOTE — Progress Notes (Signed)
BH MD OP Progress Note  11/20/2023 5:16 PM Katherine Steele  MRN:  [id number]  Chief Complaint:  Chief Complaint  Patient presents with   Follow-up   Depression   Anxiety   Medication Refill   HPI: Katherine Steele is a 52 year old Caucasian female, married, employed, lives in [place], has a history of MDD, social anxiety disorder, gender dysphoria, long-term use of cannabis was evaluated in office today.   The patient, diagnosed with major depression and social anxiety disorder, reports discontinuing Lamictal 25mg  over a month ago and is currently only on venlafaxine extended-release 75mg . She expresses dissatisfaction with the venlafaxine, feeling it is not effective, and expresses a desire to wean off it. She is considering switching to another medication, specifically Zoloft, which she had taken many years ago.  The patient describes a recent incident at work involving a coworker that triggered significant distress. The incident involved a task that was physically challenging and a conversation with a coworker that left her feeling low in the company hierarchy. This incident led to a period of obsessing over the event, taking it home, and experiencing a night of crying and missing work the next day. This episode reminded her of her emotional state before when Prozac stopped working.  She also reports some side effects from the venlafaxine, including trouble concentrating, constipation, and insomnia, which have improved but still linger. She admits to skipping the venlafaxine a few days, which led to withdrawal symptoms.  The patient also mentions physical pain related to aging and plantar fasciitis, which has been exacerbated by her job that involves a lot of walking. She has ordered shoe inserts to help manage the pain and is considering seeing a podiatrist if the pain continues.   Patient currently denies any suicidality, homicidality or perceptual disturbances.    Visit Diagnosis:     ICD-10-CM   1. MDD (major depressive disorder), recurrent, in full remission (HCC)  F33.42 venlafaxine XR (EFFEXOR XR) 37.5 MG 24 hr capsule    2. Social anxiety disorder  F40.10 venlafaxine XR (EFFEXOR XR) 37.5 MG 24 hr capsule    3. Gender dysphoria in adult  F64.0       Past Psychiatric History: Reviewed past psychiatric history from progress note on 11/30/2022.  Past trials of medications like Zoloft, Wellbutrin-made her crazy, Xanax.  Patient with recent suicide attempt-inpatient behavioral health admission-01/18/2023 - 01/19/2023-after an overdose on 30 pills of propranolol 10 mg.  Patient completed PHP/MH IOP-03/18/2023.  Past Medical History:  Past Medical History:  Diagnosis Date   BMI 33.0-33.9,adult    Endometriosis    s/p hysterectomy   GAD (generalized anxiety disorder)    Dr. Lucianne Muss psychiatrist   Gender dysphoria 2014   did undergo 10 mo testosterone treatment   GERD (gastroesophageal reflux disease)    HLD (hyperlipidemia) 08/12/2014   Mild off meds    MDD (major depressive disorder)    Dr. Lucianne Muss psychiatrist   PONV (postoperative nausea and vomiting)    after finger surgery    Past Surgical History:  Procedure Laterality Date   CESAREAN SECTION     COLONOSCOPY  2007   WNL [facility])   COLONOSCOPY WITH PROPOFOL N/A 03/31/2021   WNL, rpt 10 yrs [name], [place] B, MD)   ESOPHAGOGASTRODUODENOSCOPY (EGD) WITH PROPOFOL N/A 03/21/2018   barrett's without dysplasia, mild chronic gastritis, reflux gastroesophagitis [name], [name], MD)   ESOPHAGOGASTRODUODENOSCOPY (EGD) WITH PROPOFOL N/A 03/31/2021   small HH, shatski ring, ?barretts - biopsies WNL [name], Varnita  B, MD)   FINGER SURGERY Right 2007   little finger   OOPHORECTOMY Left 2006   TONSILLECTOMY  1975   TOTAL ABDOMINAL HYSTERECTOMY  12/2012   endometriosis and ovarian cysts s/p ovaries removed   TYMPANOSTOMY TUBE PLACEMENT Bilateral 1976    Family Psychiatric History: I have reviewed family  psychiatric history from progress note on 11/30/2022  Family History:  Family History  Problem Relation Age of Onset   Diabetes Mother    Cancer Mother 28       ovarian with mets   Rheum arthritis Mother    Hypertension Father    Alcohol abuse Father        history   Rheum arthritis Maternal Aunt    CAD Maternal Uncle 11       MI   Depression Paternal Uncle    Alcohol abuse Paternal Uncle    Suicidality Paternal Uncle    CAD Maternal Grandfather 57       MI   Depression Cousin    Alcohol abuse Cousin    Suicidality Cousin    Drug abuse Cousin    Suicidality Cousin    Anxiety disorder Son    Autism spectrum disorder Son    Stroke Neg Hx     Social History: I have reviewed social history from progress note on 11/30/2022 Social History   Socioeconomic History   Marital status: Married    Spouse name: michael   Number of children: 1   Years of education: Not on file   Highest education level: Associate degree: academic program  Occupational History   Not on file  Tobacco Use   Smoking status: Former    Current packs/day: 0.00    Types: Cigarettes    Quit date: 02/06/1993    Years since quitting: 30.8   Smokeless tobacco: Never  Vaping Use   Vaping status: Never Used  Substance and Sexual Activity   Alcohol use: Not Currently    Comment: social/weekends   Drug use: Not Currently    Types: Marijuana    Comment: stopped 2 weeks ago smoking marijuana   Sexual activity: Yes  Other Topics Concern   Not on file  Social History Narrative   Lives with husband [name]) and son [name])   Occupation: Lexicographer   Edu: Associate's degree   Activity: exercise 3x/wk   Diet: good water, fruits/vegetables some   Social Determinants of Health   Financial Resource Strain: Not on file  Food Insecurity: No Food Insecurity (01/18/2023)   Hunger Vital Sign    Worried About Running Out of Food in the Last Year: Never true    Ran Out of Food in the Last Year: Never  true  Transportation Needs: No Transportation Needs (01/18/2023)   PRAPARE - Administrator, Civil Service (Medical): No    Lack of Transportation (Non-Medical): No  Physical Activity: Not on file  Stress: Not on file  Social Connections: Unknown (09/26/2022)   [facility]   Social Network    Social Network: Not on file    Allergies: No Known Allergies  Metabolic Disorder Labs: Lab Results  Component Value Date   HGBA1C 5.3 05/27/2023   MPG 105.41 05/27/2023   No results found for: "PROLACTIN" Lab Results  Component Value Date   CHOL 236 (H) 01/16/2023   TRIG 142.0 01/16/2023   HDL 58.70 01/16/2023   CHOLHDL 4 01/16/2023   VLDL 28.4 01/16/2023   LDLCALC 149 (  H) 01/16/2023   LDLCALC 125 (H) 12/29/2021   Lab Results  Component Value Date   TSH 1.793 12/20/2022   TSH 2.88 06/26/2021    Therapeutic Level Labs: No results found for: "LITHIUM" No results found for: "VALPROATE" No results found for: "CBMZ"  Current Medications: Current Outpatient Medications  Medication Sig Dispense Refill   omeprazole (PRILOSEC) 40 MG capsule TAKE 1 CAPSULE(40 MG) BY MOUTH DAILY 90 capsule 1   rosuvastatin (CRESTOR) 10 MG tablet TAKE 1 TABLET BY MOUTH EVERY DAY AFTER SUPPER 90 tablet 0   venlafaxine XR (EFFEXOR XR) 37.5 MG 24 hr capsule Take 1 capsule (37.5 mg total) by mouth daily with breakfast. 90 capsule 0   No current facility-administered medications for this visit.     Musculoskeletal: Strength & Muscle Tone: within normal limits Gait & Station: normal Patient leans: N/A  Psychiatric Specialty Exam: Review of Systems  Psychiatric/Behavioral: Negative.      Blood pressure 139/82, pulse (!) 58, temperature (!) 95.5 F (35.3 C), temperature source Skin, height 5\' 7"  (1.702 m), weight 213 lb 6.4 oz (96.8 kg), last menstrual period 07/17/2012.Body mass index is 33.42 kg/m.  General Appearance: Fairly Groomed  Eye Contact:  Fair   Speech:  Clear and Coherent  Volume:  Normal  Mood:  Euthymic  Affect:  Congruent  Thought Process:  Goal Directed and Descriptions of Associations: Intact  Orientation:  Full (Time, Place, and Person)  Thought Content: Logical   Suicidal Thoughts:  No  Homicidal Thoughts:  No  Memory:  Immediate;   Fair Recent;   Fair Remote;   Fair  Judgement:  Fair  Insight:  Fair  Psychomotor Activity:  Normal  Concentration:  Concentration: Fair and Attention Span: Fair  Recall:  [employer] of Knowledge: Fair  Language: Fair  Akathisia:  No  Handed:  Right  AIMS (if indicated): not done  Assets:  Communication Skills Desire for Improvement Housing Social Support Transportation  ADL's:  Intact  Cognition: WNL  Sleep:  Fair   Screenings: AIMS    Flowsheet Row Office Visit from 04/03/2023 in [facility] Admission (Discharged) from 01/18/2023 in [facility]  AIMS Total Score 0 0      AUDIT    Flowsheet Row Admission (Discharged) from 01/18/2023 in [facility] INPATIENT BEHAVIORAL MEDICINE  Alcohol Use Disorder Identification Test Final Score (AUDIT) 0      GAD-7    Flowsheet Row Office Visit from 11/20/2023 in [facility] Office Visit from 04/03/2023 in [facility] Counselor from 01/24/2023 in BEHAVIORAL HEALTH PARTIAL HOSPITALIZATION PROGRAM Office Visit from 01/23/2023 in [facility] Office Visit from 01/17/2023 in [facility]  Total GAD-7 Score 0 2 13 9 13       PHQ2-9    Flowsheet Row Office Visit from 11/20/2023 in [facility] Office Visit from 04/03/2023 in [facility] Counselor from 02/25/2023 in BEHAVIORAL HEALTH INTENSIVE PSYCH Counselor from 02/01/2023 in BEHAVIORAL HEALTH PARTIAL  HOSPITALIZATION PROGRAM Counselor from 01/24/2023 in BEHAVIORAL HEALTH PARTIAL HOSPITALIZATION PROGRAM  PHQ-2 Total Score 0 1 3 2 4   PHQ-9 Total Score -- 4 14 8 15       Flowsheet Row Office Visit from 11/20/2023 in [facility] Video Visit from 09/24/2023 in [facility] Video Visit from 08/27/2023 in [facility]  C-SSRS RISK CATEGORY Moderate Risk Moderate Risk No Risk        Assessment and Plan: CYNDEE SUNDBY is a 52 year old female, employed, currently stable with regards to her mood symptoms other than having recent episode at work which was triggered by a work situation.  Patient interested in being weaned off of venlafaxine with plan to possibly consider Zoloft in the future if she has any worsening mood since Zoloft worked better previously.  Plan as noted below.  Major Depression in remission Chronic major depression with recent exacerbation due to a work-related event, although currently coping better. Venlafaxine 75 mg is ineffective, and missed doses have caused withdrawal symptoms. Interested in weaning off venlafaxine and possibly switching to sertraline, which was effective in the past. Discussed reducing venlafaxine to 37.5 mg daily to monitor symptoms over 6-8 weeks. Emphasized the importance of coping skills and the potential benefits of sertraline, with a plan to reassess post-holidays. - Reduce venlafaxine to 37.5 mg daily - Provide a 90-day supply of venlafaxine 37.5 mg - Monitor symptoms for 6-8 weeks - Schedule follow-up in 3 months via video visit - Consider switching to sertraline if symptoms persist after weaning off venlafaxine  Social Anxiety Disorder-stable Social anxiety disorder with work-related situational triggers. Experienced a significant anxiety episode managed without therapy. Currently not in regular therapy but has had one session.  Discussed the importance of therapy if anxiety episodes increase. - Recommend resuming therapy if anxiety episodes increase - Monitor anxiety symptoms during venlafaxine dosage reduction  Gender dysphoria-improving -Continue CBT as needed.   Recurrent plantar fasciitis exacerbated by increased walking at work. Has not seen a podiatrist recently but has ordered shoe inserts online. Discussed potential need for podiatrist referral if symptoms persist. - Evaluate effectiveness of shoe inserts - Consider referral to a podiatrist if symptoms persist  Follow-up - Schedule follow-up appointment in 3 months via video visit   Collaboration of Care: Collaboration of Care: Referral or follow-up with counselor/therapist AEB patient encouraged to reestablish care with therapist if any worsening mood symptoms.  Patient/Guardian was advised Release of Information must be obtained prior to any record release in order to collaborate their care with an outside provider. Patient/Guardian was advised if they have not already done so to contact the registration department to sign all necessary forms in order for [name] to release information regarding their care.   Consent: Patient/Guardian gives verbal consent for treatment and assignment of benefits for services provided during this visit. Patient/Guardian expressed understanding and agreed to proceed.   This note was generated in part or whole with voice recognition software. Voice recognition is usually quite accurate but there are transcription errors that can and very often do occur. I apologize for any typographical errors that were not detected and corrected.    Jomarie Longs, MD 11/22/2023, 7:27 AM

## 2024-01-03 ENCOUNTER — Other Ambulatory Visit: Payer: Self-pay | Admitting: Psychiatry

## 2024-01-03 DIAGNOSIS — F3342 Major depressive disorder, recurrent, in full remission: Secondary | ICD-10-CM

## 2024-01-07 ENCOUNTER — Encounter: Payer: Self-pay | Admitting: Family Medicine

## 2024-01-14 ENCOUNTER — Telehealth: Payer: Self-pay

## 2024-01-14 ENCOUNTER — Other Ambulatory Visit: Payer: BC Managed Care – PPO

## 2024-01-14 ENCOUNTER — Other Ambulatory Visit (INDEPENDENT_AMBULATORY_CARE_PROVIDER_SITE_OTHER): Payer: Managed Care, Other (non HMO)

## 2024-01-14 ENCOUNTER — Other Ambulatory Visit: Payer: Self-pay | Admitting: Family Medicine

## 2024-01-14 DIAGNOSIS — M2550 Pain in unspecified joint: Secondary | ICD-10-CM

## 2024-01-14 DIAGNOSIS — E7800 Pure hypercholesterolemia, unspecified: Secondary | ICD-10-CM | POA: Diagnosis not present

## 2024-01-14 LAB — SEDIMENTATION RATE: Sed Rate: 19 mm/h (ref 0–30)

## 2024-01-14 LAB — LIPID PANEL
Cholesterol: 187 mg/dL (ref 0–200)
HDL: 56.6 mg/dL (ref >39.00)
LDL Cholesterol: 102 mg/dL — ABNORMAL HIGH (ref 0–99)
NonHDL: 130.33
Total CHOL/HDL Ratio: 3
Triglycerides: 142 mg/dL (ref 0.0–149.0)
VLDL: 28.4 mg/dL (ref 0.0–40.0)

## 2024-01-14 LAB — VITAMIN D 25 HYDROXY (VIT D DEFICIENCY, FRACTURES): VITD: 28.2 ng/mL — ABNORMAL LOW (ref 30.00–100.00)

## 2024-01-14 LAB — COMPREHENSIVE METABOLIC PANEL
ALT: 13 U/L (ref 0–35)
AST: 17 U/L (ref 0–37)
Albumin: 4.6 g/dL (ref 3.5–5.2)
Alkaline Phosphatase: 74 U/L (ref 39–117)
BUN: 12 mg/dL (ref 6–23)
CO2: 30 meq/L (ref 19–32)
Calcium: 9.4 mg/dL (ref 8.4–10.5)
Chloride: 103 meq/L (ref 96–112)
Creatinine, Ser: 0.54 mg/dL (ref 0.40–1.20)
GFR: 104.4 mL/min (ref >60.00)
Glucose, Bld: 84 mg/dL (ref 70–99)
Potassium: 4.5 meq/L (ref 3.5–5.1)
Sodium: 138 meq/L (ref 135–145)
Total Bilirubin: 0.7 mg/dL (ref 0.2–1.2)
Total Protein: 6.6 g/dL (ref 6.0–8.3)

## 2024-01-14 LAB — CBC WITH DIFFERENTIAL/PLATELET
Basophils Absolute: 0.1 10*3/uL (ref 0.0–0.1)
Basophils Relative: 1 % (ref 0.0–3.0)
Eosinophils Absolute: 0.1 10*3/uL (ref 0.0–0.7)
Eosinophils Relative: 2.1 % (ref 0.0–5.0)
HCT: 42.5 % (ref 36.0–46.0)
Hemoglobin: 14 g/dL (ref 12.0–15.0)
Lymphocytes Relative: 30.1 % (ref 12.0–46.0)
Lymphs Abs: 1.5 10*3/uL (ref 0.7–4.0)
MCHC: 32.9 g/dL (ref 30.0–36.0)
MCV: 89 fL (ref 78.0–100.0)
Monocytes Absolute: 0.4 10*3/uL (ref 0.1–1.0)
Monocytes Relative: 7.5 % (ref 3.0–12.0)
Neutro Abs: 3 10*3/uL (ref 1.4–7.7)
Neutrophils Relative %: 59.3 % (ref 43.0–77.0)
Platelets: 276 10*3/uL (ref 150.0–400.0)
RBC: 4.77 Mil/uL (ref 3.87–5.11)
RDW: 13.2 % (ref 11.5–15.5)
WBC: 5 10*3/uL (ref 4.0–10.5)

## 2024-01-14 LAB — CK: Total CK: 198 U/L — ABNORMAL HIGH (ref 7–177)

## 2024-01-14 LAB — TSH: TSH: 2.11 u[IU]/mL (ref 0.35–5.50)

## 2024-01-14 NOTE — Telephone Encounter (Signed)
Noted. Will check ESR

## 2024-01-14 NOTE — Telephone Encounter (Signed)
The patient called in to schedule her follow up for her repeat EGD.

## 2024-01-20 ENCOUNTER — Encounter: Payer: BC Managed Care – PPO | Admitting: Family Medicine

## 2024-01-20 ENCOUNTER — Ambulatory Visit (INDEPENDENT_AMBULATORY_CARE_PROVIDER_SITE_OTHER): Payer: Managed Care, Other (non HMO) | Admitting: Family Medicine

## 2024-01-20 ENCOUNTER — Encounter: Payer: Self-pay | Admitting: Family Medicine

## 2024-01-20 ENCOUNTER — Telehealth: Payer: Self-pay | Admitting: Family Medicine

## 2024-01-20 VITALS — BP 126/78 | HR 62 | Temp 98.1°F | Ht 67.5 in | Wt 213.0 lb

## 2024-01-20 DIAGNOSIS — K2100 Gastro-esophageal reflux disease with esophagitis, without bleeding: Secondary | ICD-10-CM

## 2024-01-20 DIAGNOSIS — E7800 Pure hypercholesterolemia, unspecified: Secondary | ICD-10-CM

## 2024-01-20 DIAGNOSIS — E66811 Obesity, class 1: Secondary | ICD-10-CM

## 2024-01-20 DIAGNOSIS — F331 Major depressive disorder, recurrent, moderate: Secondary | ICD-10-CM

## 2024-01-20 DIAGNOSIS — Z0000 Encounter for general adult medical examination without abnormal findings: Secondary | ICD-10-CM

## 2024-01-20 DIAGNOSIS — K2270 Barrett's esophagus without dysplasia: Secondary | ICD-10-CM

## 2024-01-20 MED ORDER — OMEPRAZOLE 40 MG PO CPDR: 40.0000 mg | DELAYED_RELEASE_CAPSULE | Freq: Every day | ORAL | 4 refills | Status: DC

## 2024-01-20 MED ORDER — ROSUVASTATIN CALCIUM 5 MG PO TABS: 5.0000 mg | ORAL_TABLET | Freq: Every day | ORAL | 4 refills | Status: AC

## 2024-01-20 MED ORDER — FAMOTIDINE 20 MG PO TABS: 20.0000 mg | ORAL_TABLET | Freq: Every day | ORAL | 3 refills | Status: DC

## 2024-01-20 NOTE — Assessment & Plan Note (Signed)
Encourage healthy diet and lifestyle choices to affect sustainable weight loss.  She has made healthy diet changes over the past 1-2 months.

## 2024-01-20 NOTE — Patient Instructions (Addendum)
Check up front on mammogram site insurance coverage for on-site mobile mammogram today.  Drop rosuvastatin dose down to 5mg  nightly. Return in 6 months for fasting labs to recheck cholesterol.  Good to see you today  Ask up front about changing to a female provider in the office. Schedule physical with them in 1 year

## 2024-01-20 NOTE — Assessment & Plan Note (Addendum)
Preventative protocols reviewed and updated unless pt declined. Discussed healthy diet and lifestyle.  She requests changing PCP to a female provider - will try to facilitate this.

## 2024-01-20 NOTE — Assessment & Plan Note (Signed)
Chronic, on crestor 10mg  daily but may have caused upper extremity arthralgias. Recent CPK mildly elevated. Will drop rosuvastatin back to 5mg  daily. Update FLP in 6 months.  The 10-year ASCVD risk score (Arnett DK, et al., 2019) is: 1.6%   Values used to calculate the score:     Age: 53 years     Sex: Female     Is Non-Hispanic African American: No     Diabetic: No     Tobacco smoker: No     Systolic Blood Pressure: 126 mmHg     Is BP treated: No     HDL Cholesterol: 56.6 mg/dL     Total Cholesterol: 187 mg/dL

## 2024-01-20 NOTE — Assessment & Plan Note (Signed)
Latest EGD 2022 with improvement.  Continue PPI daily. Keep upcoming appt with GI.

## 2024-01-20 NOTE — Assessment & Plan Note (Signed)
Chronic, stable period on omeprazole 40mg  daily. Notes some worsening indigestion/gassiness/bloating - this is some better since making healthy changes to her diet.  Discussed adding pepcid 20mg  at night time PRN.  Keep GI f/u 02/2024.

## 2024-01-20 NOTE — Telephone Encounter (Signed)
After visit with Dr. Reece Agar on today, 2/3, pt states she discussed transferring her care to a female provider in our office. Note was also documented on pt's AVS from Dr. Reece Agar as well. Pt states Chestine Spore was mentioned to her by Dr. Reece Agar. Pt wanted to know if she can transfer her care to [facility]? Call back # [phone].

## 2024-01-20 NOTE — Progress Notes (Signed)
Ph: [phone] Fax: [phone]   Patient ID: Katherine Steele, female    DOB: September 24, 1971, 53 y.o.   MRN: [id number]  This visit was conducted in person.  BP 126/78   Pulse 62   Temp 98.1 F (36.7 C) (Oral)   Ht 5' 7.5" (1.715 m)   Wt 213 lb (96.6 kg)   LMP 07/17/2012   SpO2 95%   BMI 32.87 kg/m    CC: CPE Subjective:   HPI: Katherine Steele is a 53 y.o. female presenting on 01/20/2024 for Annual Exam   Continues seeing counselor and psychiatrist regularly now on effexor XR 37.5mg  daily.   Barrett's esophagus without dysplasia by biopsy on EGD 03/2018 - doing well on omeprazole 20mg  daily. Dysphagia resolved after dilation of schatzki's ring. Continues daily PPI. Biopsy 03/2021 EGD - normal. Planning to reschedule EGD.   Worsening joint pains predominantly elbows some in wrists and hands. Some radiation up and down arms. Tried ibuprofen daily for 1 month - caused black tarry stools so she stopped this.   HLD - atorvastatin stopped 01/2021. Started rosuvastatin then increased to 10mg  ~12/2022.   Preventative: ESOPHAGOGASTRODUODENOSCOPY (EGD) WITH PROPOFOL 03/31/2021 - small HH, shatski ring, ?barretts - biopsies WNL Katherine Steele, Katherine B, MD)  COLONOSCOPY WITH PROPOFOL 03/31/2021 - WNL, rpt 10 yrs (Tahiliani, Katherine Jenny, MD)  Well woman - previously saw OBGYN Dr. Alvie Heidelberg last seen yrs ago. S/p total hysterectomy with bilateral oophorectomy for endometriosis with dysmenorrhea (2014). No hormone replacement afterwards.  Mammogram - Birads1 09/2022 @ [facility] - due for repeat  Lung cancer screening - not eligible Flu shot - declined  COVID vaccine Moderna 02/2020, 03/2020, no booster Tdap 08/2005. Td 2016  Shingrix - discussed, declines as father had bad reaction to this vaccine  Seat belt use discussed.  Sunscreen use discussed, no changing moles on skin.  Sleep - averages 7 hours/night Non smoker, husband smokes outdoors  Alcohol - rare  Dentist q6 mo  Eye exam yearly     Lives with husband Katherine Steele) and son Katherine Steele)  Occupation: Lexicographer - at [employer]  Edu: Associate's degree  Activity: walking weekly  Diet: good water, fruits/vegetables some      Relevant past medical, surgical, family and social history reviewed and updated as indicated. Interim medical history since our last visit reviewed. Allergies and medications reviewed and updated. Outpatient Medications Prior to Visit  Medication Sig Dispense Refill   Cholecalciferol (VITAMIN D3 PO) Take by mouth daily. 3000 IU     Coenzyme Q10 (COQ10 PO) Take by mouth daily.     venlafaxine XR (EFFEXOR XR) 37.5 MG 24 hr capsule Take 1 capsule (37.5 mg total) by mouth daily with breakfast. 90 capsule 0   omeprazole (PRILOSEC) 40 MG capsule TAKE 1 CAPSULE(40 MG) BY MOUTH DAILY 90 capsule 1   rosuvastatin (CRESTOR) 10 MG tablet TAKE 1 TABLET BY MOUTH EVERY DAY AFTER SUPPER 90 tablet 0   No facility-administered medications prior to visit.     Per HPI unless specifically indicated in ROS section below Review of Systems  Constitutional:  Negative for activity change, appetite change, chills, fatigue, fever and unexpected weight change.  HENT:  Negative for hearing loss.   Eyes:  Negative for visual disturbance.  Respiratory:  Negative for cough, chest tightness, shortness of breath and wheezing.   Cardiovascular:  Negative for chest pain, palpitations and leg swelling.  Gastrointestinal:  Positive for abdominal pain and blood in stool (episode of melena resolved  after stopping NSAIDs). Negative for abdominal distention, constipation, diarrhea, nausea and vomiting.  Genitourinary:  Negative for difficulty urinating and hematuria.  Musculoskeletal:  Positive for arthralgias. Negative for myalgias and neck pain.  Skin:  Negative for rash.  Neurological:  Negative for dizziness, seizures, syncope and headaches.  Hematological:  Negative for adenopathy. Does not bruise/bleed easily.   Psychiatric/Behavioral:  Negative for dysphoric mood. The patient is not nervous/anxious.     Objective:  BP 126/78   Pulse 62   Temp 98.1 F (36.7 C) (Oral)   Ht 5' 7.5" (1.715 m)   Wt 213 lb (96.6 kg)   LMP 07/17/2012   SpO2 95%   BMI 32.87 kg/m   Wt Readings from Last 3 Encounters:  01/20/24 213 lb (96.6 kg)  01/17/23 202 lb 13.2 oz (92 kg)  01/16/23 204 lb (92.5 kg)      Physical Exam Vitals and nursing note reviewed.  Constitutional:      Appearance: Normal appearance. She is not ill-appearing.  HENT:     Head: Normocephalic and atraumatic.     Right Ear: Tympanic membrane, ear canal and external ear normal. There is no impacted cerumen.     Left Ear: Tympanic membrane, ear canal and external ear normal. There is no impacted cerumen.     Mouth/Throat:     Mouth: Mucous membranes are moist.     Pharynx: Oropharynx is clear. No oropharyngeal exudate or posterior oropharyngeal erythema.  Eyes:     General:        Right eye: No discharge.        Left eye: No discharge.     Extraocular Movements: Extraocular movements intact.     Conjunctiva/sclera: Conjunctivae normal.     Pupils: Pupils are equal, round, and reactive to light.  Neck:     Thyroid: No thyroid mass or thyromegaly.  Cardiovascular:     Rate and Rhythm: Normal rate and regular rhythm.     Pulses: Normal pulses.     Heart sounds: Normal heart sounds. No murmur heard. Pulmonary:     Effort: Pulmonary effort is normal. No respiratory distress.     Breath sounds: Normal breath sounds. No wheezing, rhonchi or rales.  Abdominal:     General: Bowel sounds are normal. There is no distension.     Palpations: Abdomen is soft. There is no mass.     Tenderness: There is no abdominal tenderness. There is no guarding or rebound.     Hernia: No hernia is present.  Musculoskeletal:     Cervical back: Normal range of motion and neck supple. No rigidity.     Right lower leg: No edema.     Left lower leg: No edema.   Lymphadenopathy:     Cervical: No cervical adenopathy.  Skin:    General: Skin is warm and dry.     Findings: No rash.  Neurological:     General: No focal deficit present.     Mental Status: She is alert. Mental status is at baseline.  Psychiatric:        Mood and Affect: Mood normal.        Behavior: Behavior normal.       Results for orders placed or performed in visit on 01/14/24  CK   Collection Time: 01/14/24  2:06 PM  Result Value Ref Range   Total CK 198 (H) 7 - 177 U/L  VITAMIN D 25 Hydroxy (Vit-D Deficiency, Fractures)   Collection Time: 01/14/24  2:06 PM  Result Value Ref Range   VITD 28.20 (L) 30.00 - 100.00 ng/mL  Sedimentation rate   Collection Time: 01/14/24  2:06 PM  Result Value Ref Range   Sed Rate 19 0 - 30 mm/hr  CBC with Differential/Platelet   Collection Time: 01/14/24  2:06 PM  Result Value Ref Range   WBC 5.0 4.0 - 10.5 K/uL   RBC 4.77 3.87 - 5.11 Mil/uL   Hemoglobin 14.0 12.0 - 15.0 g/dL   HCT 16.1 09.6 - 04.5 %   MCV 89.0 78.0 - 100.0 fl   MCHC 32.9 30.0 - 36.0 g/dL   RDW 40.9 81.1 - 91.4 %   Platelets 276.0 150.0 - 400.0 K/uL   Neutrophils Relative % 59.3 43.0 - 77.0 %   Lymphocytes Relative 30.1 12.0 - 46.0 %   Monocytes Relative 7.5 3.0 - 12.0 %   Eosinophils Relative 2.1 0.0 - 5.0 %   Basophils Relative 1.0 0.0 - 3.0 %   Neutro Abs 3.0 1.4 - 7.7 K/uL   Lymphs Abs 1.5 0.7 - 4.0 K/uL   Monocytes Absolute 0.4 0.1 - 1.0 K/uL   Eosinophils Absolute 0.1 0.0 - 0.7 K/uL   Basophils Absolute 0.1 0.0 - 0.1 K/uL  TSH   Collection Time: 01/14/24  2:06 PM  Result Value Ref Range   TSH 2.11 0.35 - 5.50 uIU/mL  Lipid panel   Collection Time: 01/14/24  2:06 PM  Result Value Ref Range   Cholesterol 187 0 - 200 mg/dL   Triglycerides 782.9 0.0 - 149.0 mg/dL   HDL 56.21 >30.86 mg/dL   VLDL 57.8 0.0 - 46.9 mg/dL   LDL Cholesterol 629 (H) 0 - 99 mg/dL   Total CHOL/HDL Ratio 3    NonHDL 130.33   Comprehensive metabolic panel   Collection Time:  01/14/24  2:06 PM  Result Value Ref Range   Sodium 138 135 - 145 mEq/L   Potassium 4.5 3.5 - 5.1 mEq/L   Chloride 103 96 - 112 mEq/L   CO2 30 19 - 32 mEq/L   Glucose, Bld 84 70 - 99 mg/dL   BUN 12 6 - 23 mg/dL   Creatinine, Ser 5.28 0.40 - 1.20 mg/dL   Total Bilirubin 0.7 0.2 - 1.2 mg/dL   Alkaline Phosphatase 74 39 - 117 U/L   AST 17 0 - 37 U/L   ALT 13 0 - 35 U/L   Total Protein 6.6 6.0 - 8.3 g/dL   Albumin 4.6 3.5 - 5.2 g/dL   GFR 413.24 >40.10 mL/min   Calcium 9.4 8.4 - 10.5 mg/dL    Assessment & Plan:   Problem List Items Addressed This Visit     Health maintenance examination - Primary (Chronic)   Preventative protocols reviewed and updated unless pt declined. Discussed healthy diet and lifestyle.  She requests changing PCP to a female provider - will try to facilitate this.       MDD (major depressive disorder), recurrent episode, moderate (HCC)   Stable period on low dose effexor.  Appreciate psych care.       HLD (hyperlipidemia)   Chronic, on crestor 10mg  daily but may have caused upper extremity arthralgias. Recent CPK mildly elevated. Will drop rosuvastatin back to 5mg  daily. Update FLP in 6 months.  The 10-year ASCVD risk score (Arnett DK, et al., 2019) is: 1.6%   Values used to calculate the score:     Age: 23 years     Sex: Female  Is Non-Hispanic African American: No     Diabetic: No     Tobacco smoker: No     Systolic Blood Pressure: 126 mmHg     Is BP treated: No     HDL Cholesterol: 56.6 mg/dL     Total Cholesterol: 187 mg/dL       Relevant Medications   rosuvastatin (CRESTOR) 5 MG tablet   Other Relevant Orders   Lipid panel   CK   Barrett's esophagus   Latest EGD 2022 with improvement.  Continue PPI daily. Keep upcoming appt with GI.       Gastroesophageal reflux disease   Chronic, stable period on omeprazole 40mg  daily. Notes some worsening indigestion/gassiness/bloating - this is some better since making healthy changes to her  diet.  Discussed adding pepcid 20mg  at night time PRN.  Keep GI f/u 02/2024.       Relevant Medications   omeprazole (PRILOSEC) 40 MG capsule   famotidine (PEPCID) 20 MG tablet   Obesity, Class I, BMI 30-34.9   Encourage healthy diet and lifestyle choices to affect sustainable weight loss.  She has made healthy diet changes over the past 1-2 months.         Meds ordered this encounter  Medications   omeprazole (PRILOSEC) 40 MG capsule    Sig: Take 1 capsule (40 mg total) by mouth daily.    Dispense:  90 capsule    Refill:  4   rosuvastatin (CRESTOR) 5 MG tablet    Sig: Take 1 tablet (5 mg total) by mouth daily.    Dispense:  90 tablet    Refill:  4   famotidine (PEPCID) 20 MG tablet    Sig: Take 1 tablet (20 mg total) by mouth at bedtime.    Dispense:  30 tablet    Refill:  3    Orders Placed This Encounter  Procedures   Lipid panel    Standing Status:   Future    Expiration Date:   01/19/2025   CK    Standing Status:   Future    Expiration Date:   01/19/2025    Patient Instructions  Check up front on mammogram site insurance coverage for on-site mobile mammogram today.  Drop rosuvastatin dose down to 5mg  nightly. Return in 6 months for fasting labs to recheck cholesterol.  Good to see you today  Ask up front about changing to a female provider in the office. Schedule physical with them in 1 year   Follow up plan: Return in about 1 year (around 01/19/2025) for annual exam, prior fasting for blood work.  Eustaquio Boyden, MD

## 2024-01-20 NOTE — Assessment & Plan Note (Addendum)
Stable period on low dose effexor.  Appreciate psych care.

## 2024-01-20 NOTE — Telephone Encounter (Signed)
Happy to accept patient.

## 2024-01-21 NOTE — Telephone Encounter (Signed)
Lvmtcb relaying both provider's message. Next available slot for toc visit for Katherine Steele is 03/24/24

## 2024-01-21 NOTE — Telephone Encounter (Signed)
Ok to do very nice patient.

## 2024-01-23 ENCOUNTER — Telehealth: Payer: Self-pay | Admitting: Family Medicine

## 2024-01-23 NOTE — Telephone Encounter (Signed)
 Copied from CRM [id number]. Topic: Appointments - Transfer of Care >> Jan 23, 2024  2:21 PM Selinda RAMAN wrote: Pt is requesting to transfer FROM: Dr Anton Blas Pt is requesting to transfer TO: Dr Comer Gaskins Reason for requested transfer: The patient just wants a female provider It is the responsibility of the team the patient would like to transfer to (Dr. Gaskins ) to reach out to the patient if for any reason this transfer is not acceptable.  Please assist patient further with scheduling as she prefers the afternoon around 2- 3 if possible

## 2024-02-16 NOTE — Progress Notes (Unsigned)
 Virtual Visit via Video Note  I connected with Katherine Steele on 02/17/24 at  3:00 PM EST by a video enabled telemedicine application and verified that I am speaking with the correct person using two identifiers.  Location Provider Location : [facility] Patient Location : Home  Participants: Patient , Provider    I discussed the limitations of evaluation and management by telemedicine and the availability of in person appointments. The patient expressed understanding and agreed to proceed.  I discussed the assessment and treatment plan with the patient. The patient was provided an opportunity to ask questions and all were answered. The patient agreed with the plan and demonstrated an understanding of the instructions.   The patient was advised to call back or seek an in-person evaluation if the symptoms worsen or if the condition fails to improve as anticipated.   BH MD OP Progress Note  02/18/2024 1:14 PM LEONDA CRISTO  MRN:  [id number]  Chief Complaint:  Chief Complaint  Patient presents with   Follow-up   Depression   Anxiety   Medication Refill   HPI: Katherine Steele is a 53 year old Caucasian female, married, employed, lives in [address], has a history of MDD, social anxiety disorder, gender dysphoria, long-term use of cannabis was evaluated by telemedicine today.  The patient's major depression is currently well-managed and stable. She is taking venlafaxine at a reduced dosage of 37.5 mg, which she feels helps her stay stable.  Denies recent suicidal thoughts, and her last suicide attempt was approximately a year ago. Over the past two weeks, no lack of interest, feelings of depression, anxiety, or nervousness.  Her social anxiety disorder is stable and managed with venlafaxine. No recent anxiety symptoms, and she is coping well with work and home life.  She has a history of plantar fasciitis, exacerbated by increased walking at work. She is trying to lose weight by reducing food  intake, which has helped alleviate some symptoms.  She is currently compliant on the venlafaxine.  Would like to stay on this dosage.  Interested in transferring her care back to her primary care provider.  Agrees to have a discussion with primary care provider regarding this.  Denies any other concerns today.   Visit Diagnosis:    ICD-10-CM   1. MDD (major depressive disorder), recurrent, in full remission (HCC)  F33.42     2. Social anxiety disorder  F40.10     3. Gender dysphoria in adult  F64.0       Past Psychiatric History: I have reviewed past psychiatric history from progress note on 11/30/2022.  Past trials of medications like Zoloft, Wellbutrin-made her 'crazy', Xanax.  Patient with history of recent suicide attempt-inpatient behavioral health admission-01/18/2023 - 01/19/2023-overdose on 30 pills of propranolol 10 mg.  Patient completed PHP/MH IOP-03/18/2023.  Past Medical History:  Past Medical History:  Diagnosis Date   BMI 33.0-33.9,adult    Endometriosis    s/p hysterectomy   GAD (generalized anxiety disorder)    Dr. Lucianne Muss psychiatrist   Gender dysphoria 2014   did undergo 10 mo testosterone treatment   GERD (gastroesophageal reflux disease) 2017   HLD (hyperlipidemia) 08/12/2014   Mild off meds    MDD (major depressive disorder)    Dr. Lucianne Muss psychiatrist   PONV (postoperative nausea and vomiting)    after finger surgery    Past Surgical History:  Procedure Laterality Date   CESAREAN SECTION  2002   COLONOSCOPY  2007   WNL [name])   COLONOSCOPY  WITH PROPOFOL N/A 03/31/2021   WNL, rpt 10 yrs [name], [name], MD)   ESOPHAGOGASTRODUODENOSCOPY (EGD) WITH PROPOFOL N/A 03/21/2018   barrett's without dysplasia, mild chronic gastritis, reflux gastroesophagitis [name], [name], MD)   ESOPHAGOGASTRODUODENOSCOPY (EGD) WITH PROPOFOL N/A 03/31/2021   small HH, shatski ring, ?barretts - biopsies WNL [name], [name], MD)   FINGER SURGERY Right 2007    little finger   OOPHORECTOMY Left 2006   TONSILLECTOMY  1975   TOTAL ABDOMINAL HYSTERECTOMY  12/2012   endometriosis and ovarian cysts s/p ovaries removed   TYMPANOSTOMY TUBE PLACEMENT Bilateral 1976    Family Psychiatric History: I have reviewed family psychiatric history from progress note on 11/30/2022.  Family History:  Family History  Problem Relation Age of Onset   Diabetes Mother    Cancer Mother 40       ovarian with mets   Rheum arthritis Mother    Hypertension Father    Alcohol abuse Father        history   Rheum arthritis Maternal Aunt    CAD Maternal Uncle 62       MI   Depression Paternal Uncle    Alcohol abuse Paternal Uncle    Suicidality Paternal Uncle    CAD Maternal Grandfather 8       MI   Depression Cousin    Alcohol abuse Cousin    Suicidality Cousin    Drug abuse Cousin    Suicidality Cousin    Anxiety disorder Son    Autism spectrum disorder Son    Intellectual disability Son    Stroke Neg Hx     Social History: I have reviewed social history from progress note on 11/30/2022. Social History   Socioeconomic History   Marital status: Married    Spouse name: michael   Number of children: 1   Years of education: Not on file   Highest education level: Associate degree: occupational, Scientist, product/process development, or vocational program  Occupational History   Not on file  Tobacco Use   Smoking status: Former    Current packs/day: 0.00    Average packs/day: 0.3 packs/day for 5.0 years (1.3 ttl pk-yrs)    Types: Cigarettes    Quit date: 02/06/1993    Years since quitting: 31.0   Smokeless tobacco: Never   Tobacco comments:    Already quit  Vaping Use   Vaping status: Never Used  Substance and Sexual Activity   Alcohol use: Not Currently    Comment: social/weekends   Drug use: Not Currently    Types: Marijuana    Comment: stopped 2 weeks ago smoking marijuana   Sexual activity: Yes    Birth control/protection: None  Other Topics Concern   Not on file   Social History Narrative   Lives with husband Katherine Steele) and son Katherine Steele)   Occupation: Lexicographer   Edu: Associate's degree   Activity: exercise 3x/wk   Diet: good water, fruits/vegetables some   Social Drivers of Corporate investment banker Strain: Low Risk  (01/19/2024)   Overall Financial Resource Strain (CARDIA)    Difficulty of Paying Living Expenses: Not very hard  Food Insecurity: No Food Insecurity (01/19/2024)   Hunger Vital Sign    Worried About Running Out of Food in the Last Year: Never true    Ran Out of Food in the Last Year: Never true  Transportation Needs: No Transportation Needs (01/19/2024)   PRAPARE - Administrator, Civil Service (Medical): No  Lack of Transportation (Non-Medical): No  Physical Activity: Insufficiently Active (01/19/2024)   Exercise Vital Sign    Days of Exercise per Week: 2 days    Minutes of Exercise per Session: 30 min  Stress: No Stress Concern Present (01/19/2024)   [employer] of Occupational Health - Occupational Stress Questionnaire    Feeling of Stress : Not at all  Social Connections: Moderately Integrated (01/19/2024)   Social Connection and Isolation Panel [NHANES]    Frequency of Communication with Friends and Family: Once a week    Frequency of Social Gatherings with Friends and Family: Once a week    Attends Religious Services: More than 4 times per year    Active Member of Clubs or Organizations: Yes    Attends Engineer, structural: More than 4 times per year    Marital Status: Married    Allergies: No Known Allergies  Metabolic Disorder Labs: Lab Results  Component Value Date   HGBA1C 5.3 05/27/2023   MPG 105.41 05/27/2023   No results found for: "PROLACTIN" Lab Results  Component Value Date   CHOL 187 01/14/2024   TRIG 142.0 01/14/2024   HDL 56.60 01/14/2024   CHOLHDL 3 01/14/2024   VLDL 28.4 01/14/2024   LDLCALC 102 (H) 01/14/2024   LDLCALC 149 (H) 01/16/2023   Lab Results   Component Value Date   TSH 2.11 01/14/2024   TSH 1.793 12/20/2022    Therapeutic Level Labs: No results found for: "LITHIUM" No results found for: "VALPROATE" No results found for: "CBMZ"  Current Medications: Current Outpatient Medications  Medication Sig Dispense Refill   Cholecalciferol (VITAMIN D3 PO) Take by mouth daily. 3000 IU     Coenzyme Q10 (COQ10 PO) Take by mouth daily.     omeprazole (PRILOSEC) 40 MG capsule Take 1 capsule (40 mg total) by mouth daily. 90 capsule 4   rosuvastatin (CRESTOR) 5 MG tablet Take 1 tablet (5 mg total) by mouth daily. 90 tablet 4   venlafaxine XR (EFFEXOR XR) 37.5 MG 24 hr capsule Take 1 capsule (37.5 mg total) by mouth daily with breakfast. 90 capsule 0   famotidine (PEPCID) 20 MG tablet Take 1 tablet (20 mg total) by mouth at bedtime. (Patient not taking: Reported on 02/17/2024) 30 tablet 3   No current facility-administered medications for this visit.     Musculoskeletal: Strength & Muscle Tone:  UTA Gait & Station:  Seated Patient leans: N/A  Psychiatric Specialty Exam: Review of Systems  Psychiatric/Behavioral: Negative.      Last menstrual period 07/17/2012.There is no height or weight on file to calculate BMI.  General Appearance: Fairly Groomed  Eye Contact:  Fair  Speech:  Clear and Coherent  Volume:  Normal  Mood:  Euthymic  Affect:  Congruent  Thought Process:  Goal Directed and Descriptions of Associations: Intact  Orientation:  Full (Time, Place, and Person)  Thought Content: Logical   Suicidal Thoughts:  No  Homicidal Thoughts:  No  Memory:  Immediate;   Fair Recent;   Fair Remote;   Fair  Judgement:  Fair  Insight:  Fair  Psychomotor Activity:  Normal  Concentration:  Concentration: Fair and Attention Span: Fair  Recall:  [employer] of Knowledge: Fair  Language: Fair  Akathisia:  No  Handed:  Right  AIMS (if indicated): not done  Assets:  Desire for Improvement Housing Social Support  ADL's:  Intact   Cognition: WNL  Sleep:  Fair   Screenings: AIMS  Flowsheet Row Office Visit from 04/03/2023 in [facility] Admission (Discharged) from 01/18/2023 in [facility]  AIMS Total Score 0 0      AUDIT    Flowsheet Row Admission (Discharged) from 01/18/2023 in [facility] INPAT[facility] BE[facility]AL MEDICINE  Alcohol Use Disorder Identification Test Final Score (AUDIT) 0      GAD-7    Flowsheet Row Video Visit from 02/17/2024 in [facility] Office Visit from 01/20/2024 in [facility] Office Visit from 11/20/2023 in [facility] Office Visit from 04/03/2023 in [facility] Counselor from 01/24/2023 in BEHAVIORAL HEALTH PARTIAL HOSPITALIZATION PROGRAM  Total GAD-7 Score 0 0 0 2 13      PHQ2-9    Flowsheet Row Video Visit from 02/17/2024 in [facility] Office Visit from 01/20/2024 in [facility] Office Visit from 11/20/2023 in [facility] Office Visit from 04/03/2023 in [facility] Counselor from 02/25/2023 in BEHAVIORAL HEALTH INTENSIVE PSYCH  PHQ-2 Total Score 0 0 0 1 3  PHQ-9 Total Score -- -- -- 4 14      Flowsheet Row Video Visit from 02/17/2024 in [facility] Office Visit from 11/20/2023 in [facility] Video Visit from 09/24/2023 in [facility]  C-SSRS RISK CATEGORY Moderate Risk Moderate Risk Moderate Risk        Assessment and Plan: Katherine Steele is a 52 year old female, employed, currently stable, presented for a follow-up appointment.  Discussed assessment and plan as noted below.  Major Depression in  remission Major depression is well-managed on venlafaxine 37.5 mg. No recent suicidal ideation or depressive symptoms. Last suicide attempt was in February last year. Patient prefers to continue venlafaxine due to its stabilizing effects. - Continue Venlafaxine 37.5 mg daily. - Discuss with primary doctor about continuing management - If primary doctor cannot manage, schedule follow-up in 3 months  Social Anxiety Disorder-stable Social anxiety disorder is well-managed on venlafaxine 37.5 mg. No recent symptoms of anxiety. Patient prefers to continue venlafaxine due to its stabilizing effects. - Continue Venlafaxine 37.5 mg - Discuss with primary doctor about continuing management - If primary doctor cannot manage, schedule follow-up in 3 months  Gender dysphoria-stable Currently denies any concerns. - Reevaluate for worsening symptoms as needed.  Follow-up - Discuss medication management with primary doctor - Schedule follow-up in 3 months if primary doctor cannot manage - Sign a release to obtain medical records if needed.  Collaboration of Care: Collaboration of Care: Primary Care Provider AEB patient is interested in transferring her psychiatric care to her primary care provider since her symptoms are currently stable.  Patient agrees to get in touch with primary care to discuss.  Patient/Guardian was advised Release of Information must be obtained prior to any record release in order to collaborate their care with an outside provider. Patient/Guardian was advised if they have not already done so to contact the registration department to sign all necessary forms in order for [name] to release information regarding their care.   Consent: Patient/Guardian gives verbal consent for treatment and assignment of benefits for services provided during this visit. Patient/Guardian expressed understanding and agreed to proceed.  Discussed the use of a AI scribe software for clinical note transcription  with the patient, who gave verbal  consent to proceed. This note was generated in part or whole with voice recognition software. Voice recognition is usually quite accurate but there are transcription errors that can and very often do occur. I apologize for any typographical errors that were not detected and corrected.     Jomarie Longs, MD 02/18/2024, 1:14 PM

## 2024-02-17 ENCOUNTER — Telehealth (INDEPENDENT_AMBULATORY_CARE_PROVIDER_SITE_OTHER): Payer: 59 | Admitting: Psychiatry

## 2024-02-17 ENCOUNTER — Encounter: Payer: Self-pay | Admitting: Psychiatry

## 2024-02-17 DIAGNOSIS — F3342 Major depressive disorder, recurrent, in full remission: Secondary | ICD-10-CM

## 2024-02-17 DIAGNOSIS — F4010 Social phobia, unspecified: Secondary | ICD-10-CM

## 2024-02-17 DIAGNOSIS — F640 Transsexualism: Secondary | ICD-10-CM | POA: Diagnosis not present

## 2024-02-18 ENCOUNTER — Encounter: Payer: Self-pay | Admitting: Family Medicine

## 2024-02-18 ENCOUNTER — Other Ambulatory Visit: Payer: Self-pay | Admitting: Psychiatry

## 2024-02-18 DIAGNOSIS — F3342 Major depressive disorder, recurrent, in full remission: Secondary | ICD-10-CM

## 2024-02-18 DIAGNOSIS — F4010 Social phobia, unspecified: Secondary | ICD-10-CM

## 2024-02-18 MED ORDER — VENLAFAXINE HCL ER 37.5 MG PO CP24: 37.5000 mg | ORAL_CAPSULE | Freq: Every day | ORAL | 0 refills | Status: DC

## 2024-02-18 NOTE — Telephone Encounter (Signed)
I have sent venlafaxine to pharmacy. 

## 2024-03-02 ENCOUNTER — Other Ambulatory Visit (INDEPENDENT_AMBULATORY_CARE_PROVIDER_SITE_OTHER): Payer: Managed Care, Other (non HMO)

## 2024-03-02 DIAGNOSIS — E7800 Pure hypercholesterolemia, unspecified: Secondary | ICD-10-CM | POA: Diagnosis not present

## 2024-03-03 LAB — LIPID PANEL
Cholesterol: 193 mg/dL (ref 0–200)
HDL: 59.8 mg/dL (ref >39.00)
LDL Cholesterol: 105 mg/dL — ABNORMAL HIGH (ref 0–99)
NonHDL: 133.06
Total CHOL/HDL Ratio: 3
Triglycerides: 141 mg/dL (ref 0.0–149.0)
VLDL: 28.2 mg/dL (ref 0.0–40.0)

## 2024-03-03 LAB — CK: Total CK: 52 U/L (ref 7–177)

## 2024-03-04 ENCOUNTER — Encounter: Payer: Self-pay | Admitting: Family Medicine

## 2024-03-05 ENCOUNTER — Ambulatory Visit: Payer: Managed Care, Other (non HMO) | Admitting: Gastroenterology

## 2024-03-24 ENCOUNTER — Encounter: Payer: Self-pay | Admitting: Primary Care

## 2024-03-24 ENCOUNTER — Ambulatory Visit (INDEPENDENT_AMBULATORY_CARE_PROVIDER_SITE_OTHER): Payer: Managed Care, Other (non HMO) | Admitting: Primary Care

## 2024-03-24 VITALS — BP 124/76 | HR 57 | Temp 97.6°F | Ht 67.5 in | Wt 204.0 lb

## 2024-03-24 DIAGNOSIS — F419 Anxiety disorder, unspecified: Secondary | ICD-10-CM

## 2024-03-24 DIAGNOSIS — Z1231 Encounter for screening mammogram for malignant neoplasm of breast: Secondary | ICD-10-CM

## 2024-03-24 DIAGNOSIS — E7800 Pure hypercholesterolemia, unspecified: Secondary | ICD-10-CM | POA: Diagnosis not present

## 2024-03-24 DIAGNOSIS — F330 Major depressive disorder, recurrent, mild: Secondary | ICD-10-CM

## 2024-03-24 DIAGNOSIS — K2270 Barrett's esophagus without dysplasia: Secondary | ICD-10-CM

## 2024-03-24 NOTE — Patient Instructions (Signed)
 Complete your mammogram as scheduled.  You will either be contacted via phone regarding your referral to GI, or you may receive a letter on your MyChart portal from our referral team with instructions for scheduling an appointment. Please let us know if you have not been contacted by anyone within two weeks.  You will receive a phone call regarding the CT scan.  It was a pleasure meeting you!

## 2024-03-24 NOTE — Addendum Note (Signed)
 Addended by: Doreene Nest on: 03/24/2024 02:55 PM   Modules accepted: Orders

## 2024-03-24 NOTE — Assessment & Plan Note (Signed)
 Reviewed upper endoscopy from 2022. Agree that upper endoscopy should be repeated.  Referral placed to GI for further evaluation repeat endoscopy. We discussed recommendations for continued PPI therapy.,  She will discuss with GI.

## 2024-03-24 NOTE — Assessment & Plan Note (Signed)
 CT coronary calcium scan ordered and pending.  Patient agrees.  Continue rosuvastatin 5 mg daily for now.

## 2024-03-24 NOTE — Assessment & Plan Note (Signed)
 Controlled. Reviewed psychiatry notes from March 2025.  Continue venlafaxine 37.5 mg daily.  Agreed to take over prescription

## 2024-03-24 NOTE — Progress Notes (Signed)
 Subjective:    Patient ID: Katherine Steele, female    DOB: 1971-12-04, 53 y.o.   MRN: [id number]  HPI  Katherine Steele is a very pleasant 53 y.o. female patient of Dr. Sharen Hones who presents today to transfer care.  1) Hyperlipidemia: Currently managed on rosuvastatin 5 mg daily.  Last lipid panel was completed in March 2025 with LDL of 105, HDL 59, Triggs 141.  CK level was 52 which was a reduction from 198 two months earlier.  Rosuvastatin was lowered to 5 mg 2 months earlier.  She denies family history of heart attack/stroke in either parents. Highest LDL was 197 previously. She has not undergone a coronary CT scan.   2) Anxiety/MDD: Currently managed on venlafaxine ER 37.5 mg daily.  Following with psychiatry, last office visit was 02/17/2024, during this visit it was recommended for PCP to take over medication treatment as she was stable.  She feels well managed. She would like for PCP to managed her venlafaxine.   3) GERD/Barrett's esophagus/esophageal dysphagia: Currently managed on omeprazole 40 mg daily, famotidine 20 mg daily.    Last endoscopy was completed in April 2022 per GI, revealed nonobstructive Schatzki's ring, Barrett's esophagus.  Recommendations were to repeat upper endoscopy in April 2025.  She has not seen GI since 2022, will need a new referral.  She stopped her omeprazole 40 mg about 1 week ago as she's afraid of long term effects. She has not been taking her famotidine. She's improved her diet by eliminating fried and spicy foods, also avoiding overeating. Since these interventions she's noticed some improvement. She continues to notice a soreness in the esophagus. She has noticed that her stools have normalized, less diarrhea.   She does have a history of upper GI bleed.  Review of Systems  Respiratory:  Negative for shortness of breath.   Cardiovascular:  Negative for chest pain.  Gastrointestinal:  Negative for constipation and diarrhea.   Psychiatric/Behavioral:  The patient is not nervous/anxious.          Past Medical History:  Diagnosis Date   Acquired trigger finger 09/19/2016   BMI 33.0-33.9,adult    Endometriosis    s/p hysterectomy   GAD (generalized anxiety disorder)    Dr. Lucianne Muss psychiatrist   Gender dysphoria 2014   did undergo 10 mo testosterone treatment   GERD (gastroesophageal reflux disease) 2017   HLD (hyperlipidemia) 08/12/2014   Mild off meds    MDD (major depressive disorder)    Dr. Lucianne Muss psychiatrist   PONV (postoperative nausea and vomiting)    after finger surgery    Social History   Socioeconomic History   Marital status: Married    Spouse name: michael   Number of children: 1   Years of education: Not on file   Highest education level: Associate degree: occupational, Scientist, product/process development, or vocational program  Occupational History   Not on file  Tobacco Use   Smoking status: Former    Current packs/day: 0.00    Average packs/day: 0.3 packs/day for 5.0 years (1.3 ttl pk-yrs)    Types: Cigarettes    Quit date: 02/06/1993    Years since quitting: 31.1   Smokeless tobacco: Never   Tobacco comments:    Already quit  Vaping Use   Vaping status: Never Used  Substance and Sexual Activity   Alcohol use: Not Currently    Comment: social/weekends   Drug use: Not Currently    Types: Marijuana    Comment: stopped 2 weeks  ago smoking marijuana   Sexual activity: Yes    Birth control/protection: None  Other Topics Concern   Not on file  Social History Narrative   Lives with husband [name]) and son [name])   Occupation: Lexicographer   Edu: Associate's degree   Activity: exercise 3x/wk   Diet: good water, fruits/vegetables some   Social Drivers of Corporate investment banker Strain: Low Risk  (01/19/2024)   Overall Financial Resource Strain (CARDIA)    Difficulty of Paying Living Expenses: Not very hard  Food Insecurity: No Food Insecurity (01/19/2024)   Hunger Vital Sign     Worried About Running Out of Food in the Last Year: Never true    Ran Out of Food in the Last Year: Never true  Transportation Needs: No Transportation Needs (01/19/2024)   PRAPARE - Administrator, Civil Service (Medical): No    Lack of Transportation (Non-Medical): No  Physical Activity: Insufficiently Active (01/19/2024)   Exercise Vital Sign    Days of Exercise per Week: 2 days    Minutes of Exercise per Session: 30 min  Stress: No Stress Concern Present (01/19/2024)   [employer] of Occupational Health - Occupational Stress Questionnaire    Feeling of Stress : Not at all  Social Connections: Moderately Integrated (01/19/2024)   Social Connection and Isolation Panel [NHANES]    Frequency of Communication with Friends and Family: Once a week    Frequency of Social Gatherings with Friends and Family: Once a week    Attends Religious Services: More than 4 times per year    Active Member of [employer] or Organizations: Yes    Attends Banker Meetings: More than 4 times per year    Marital Status: Married  Catering manager Violence: Not At Risk (01/18/2023)   Humiliation, Afraid, Rape, and Kick questionnaire    Fear of Current or Ex-Partner: No    Emotionally Abused: No    Physically Abused: No    Sexually Abused: No    Past Surgical History:  Procedure Laterality Date   CESAREAN SECTION  2002   COLONOSCOPY  2007   WNL ([place])   COLONOSCOPY WITH PROPOFOL N/A 03/31/2021   WNL, rpt 10 yrs [name], Jackson B, MD)   ESOPHAGOGASTRODUODENOSCOPY (EGD) WITH PROPOFOL N/A 03/21/2018   barrett's without dysplasia, mild chronic gastritis, reflux gastroesophagitis [name], [name], MD)   ESOPHAGOGASTRODUODENOSCOPY (EGD) WITH PROPOFOL N/A 03/31/2021   small HH, shatski ring, ?barretts - biopsies WNL [name], [name], MD)   FINGER SURGERY Right 2007   little finger   OOPHORECTOMY Left 2006   TONSILLECTOMY  1975   TOTAL ABDOMINAL HYSTERECTOMY  12/2012    endometriosis and ovarian cysts s/p ovaries removed   TYMPANOSTOMY TUBE PLACEMENT Bilateral 1976    Family History  Problem Relation Age of Onset   Diabetes Mother    Cancer Mother 75       ovarian with mets   Rheum arthritis Mother    Hypertension Father    Alcohol abuse Father        history   Rheum arthritis Maternal Aunt    CAD Maternal Uncle 93       MI   Depression Paternal Uncle    Alcohol abuse Paternal Uncle    Suicidality Paternal Uncle    CAD Maternal Grandfather 37       MI   Depression Cousin    Alcohol abuse Cousin    Suicidality Cousin    Drug  abuse Cousin    Suicidality Cousin    Anxiety disorder Son    Autism spectrum disorder Son    Intellectual disability Son    Stroke Neg Hx     No Known Allergies  Current Outpatient Medications on File Prior to Visit  Medication Sig Dispense Refill   Cholecalciferol (VITAMIN D3 PO) Take by mouth daily. 3000 IU     Coenzyme Q10 (COQ10 PO) Take by mouth daily.     omeprazole (PRILOSEC) 40 MG capsule Take 1 capsule (40 mg total) by mouth daily. 90 capsule 4   rosuvastatin (CRESTOR) 5 MG tablet Take 1 tablet (5 mg total) by mouth daily. 90 tablet 4   venlafaxine XR (EFFEXOR XR) 37.5 MG 24 hr capsule Take 1 capsule (37.5 mg total) by mouth daily with breakfast. 90 capsule 0   No current facility-administered medications on file prior to visit.    BP 124/76   Pulse (!) 57   Temp 97.6 F (36.4 C) (Temporal)   Ht 5' 7.5" (1.715 m)   Wt 204 lb (92.5 kg)   LMP 07/17/2012   SpO2 98%   BMI 31.48 kg/m  Objective:   Physical Exam Cardiovascular:     Rate and Rhythm: Normal rate and regular rhythm.  Pulmonary:     Effort: Pulmonary effort is normal.     Breath sounds: Normal breath sounds.  Musculoskeletal:     Cervical back: Neck supple.  Skin:    General: Skin is warm and dry.  Neurological:     Mental Status: She is alert and oriented to person, place, and time.  Psychiatric:        Mood and Affect: Mood  normal.           Assessment & Plan:  Encounter for screening for HIV  Encounter for screening mammogram for malignant neoplasm of breast -     3D Screening Mammogram, Left and Right; Future  Barrett's esophagus without dysplasia Assessment & Plan: Reviewed upper endoscopy from 2022. Agree that upper endoscopy should be repeated.  Referral placed to GI for further evaluation repeat endoscopy. We discussed recommendations for continued PPI therapy.,  She will discuss with GI.  Orders: -     Ambulatory referral to Gastroenterology  Pure hypercholesterolemia Assessment & Plan: CT coronary calcium scan ordered and pending.  Patient agrees.  Continue rosuvastatin 5 mg daily for now.  Orders: -     CT CARDIAC SCORING (SELF PAY ONLY); Future  Anxiety disorder, unspecified type Assessment & Plan: Controlled. Reviewed psychiatry notes from March 2025.  Continue venlafaxine 37.5 mg daily.  Agreed to take over prescription   MDD (major depressive disorder), recurrent episode, mild (HCC) Assessment & Plan: Controlled. Reviewed psychiatry notes from March 2025.  Continue venlafaxine 37.5 mg daily.  Agreed to take over prescription         Doreene Nest, NP

## 2024-04-03 ENCOUNTER — Other Ambulatory Visit

## 2024-04-07 ENCOUNTER — Ambulatory Visit
Admission: RE | Admit: 2024-04-07 | Discharge: 2024-04-07 | Disposition: A | Discharge status: Home / Self Care | Payer: Self-pay | Source: Ambulatory Visit | Attending: Primary Care | Admitting: Primary Care

## 2024-04-07 DIAGNOSIS — E7800 Pure hypercholesterolemia, unspecified: Secondary | ICD-10-CM | POA: Insufficient documentation

## 2024-04-17 ENCOUNTER — Encounter

## 2024-05-13 DIAGNOSIS — K2100 Gastro-esophageal reflux disease with esophagitis, without bleeding: Secondary | ICD-10-CM

## 2024-05-13 DIAGNOSIS — K2270 Barrett's esophagus without dysplasia: Secondary | ICD-10-CM

## 2024-05-14 MED ORDER — OMEPRAZOLE 20 MG PO CPDR: 20.0000 mg | DELAYED_RELEASE_CAPSULE | Freq: Every day | ORAL | 2 refills | Status: DC

## 2024-05-29 ENCOUNTER — Other Ambulatory Visit: Payer: Self-pay | Admitting: Psychiatry

## 2024-05-29 DIAGNOSIS — F4010 Social phobia, unspecified: Secondary | ICD-10-CM

## 2024-05-29 DIAGNOSIS — F3342 Major depressive disorder, recurrent, in full remission: Secondary | ICD-10-CM

## 2024-06-22 ENCOUNTER — Ambulatory Visit
Admission: RE | Admit: 2024-06-22 | Discharge: 2024-06-22 | Disposition: A | Discharge status: Home / Self Care | Source: Ambulatory Visit | Attending: Primary Care

## 2024-06-22 DIAGNOSIS — Z1231 Encounter for screening mammogram for malignant neoplasm of breast: Secondary | ICD-10-CM

## 2024-06-25 ENCOUNTER — Ambulatory Visit: Payer: Self-pay | Admitting: Primary Care

## 2024-08-31 DIAGNOSIS — F3342 Major depressive disorder, recurrent, in full remission: Secondary | ICD-10-CM

## 2024-08-31 DIAGNOSIS — F4010 Social phobia, unspecified: Secondary | ICD-10-CM

## 2024-09-01 MED ORDER — VENLAFAXINE HCL ER 37.5 MG PO CP24
37.5000 mg | ORAL_CAPSULE | Freq: Every day | ORAL | 1 refills | Status: DC
Start: 2024-09-01 — End: 2024-10-04

## 2024-10-02 ENCOUNTER — Encounter (INDEPENDENT_AMBULATORY_CARE_PROVIDER_SITE_OTHER): Payer: Self-pay

## 2024-10-02 DIAGNOSIS — F4010 Social phobia, unspecified: Secondary | ICD-10-CM | POA: Diagnosis not present

## 2024-10-02 DIAGNOSIS — F419 Anxiety disorder, unspecified: Secondary | ICD-10-CM

## 2024-10-02 DIAGNOSIS — F331 Major depressive disorder, recurrent, moderate: Secondary | ICD-10-CM

## 2024-10-04 MED ORDER — FLUOXETINE HCL 20 MG PO TABS: 20.0000 mg | ORAL_TABLET | Freq: Every day | ORAL | 0 refills | Status: DC

## 2024-10-04 NOTE — Telephone Encounter (Signed)
Please see the MyChart message reply(ies) for my assessment and plan.  The patient gave consent for this Medical Advice Message and is aware that it may result in a bill to their insurance company as well as the possibility that this may result in a co-payment or deductible. They are an established patient, but are not seeking medical advice exclusively about a problem treated during an in person or video visit in the last 7 days. I did not recommend an in person or video visit within 7 days of my reply.  I spent a total of 11 minutes cumulative time within 7 days through [place], NP

## 2024-11-02 ENCOUNTER — Encounter (INDEPENDENT_AMBULATORY_CARE_PROVIDER_SITE_OTHER): Payer: Self-pay

## 2024-11-02 DIAGNOSIS — F419 Anxiety disorder, unspecified: Secondary | ICD-10-CM

## 2024-11-02 DIAGNOSIS — F331 Major depressive disorder, recurrent, moderate: Secondary | ICD-10-CM

## 2024-11-02 DIAGNOSIS — F4010 Social phobia, unspecified: Secondary | ICD-10-CM

## 2024-11-04 MED ORDER — FLUOXETINE HCL 40 MG PO CAPS: 40.0000 mg | ORAL_CAPSULE | Freq: Every day | ORAL | 0 refills | Status: AC

## 2024-11-04 NOTE — Telephone Encounter (Signed)
Please see the MyChart message reply(ies) for my assessment and plan.  The patient gave consent for this Medical Advice Message and is aware that it may result in a bill to their insurance company as well as the possibility that this may result in a co-payment or deductible. They are an established patient, but are not seeking medical advice exclusively about a problem treated during an in person or video visit in the last 7 days. I did not recommend an in person or video visit within 7 days of my reply.  I spent a total of 10 minutes cumulative time within 7 days through MyChart messaging Katherine Steele Katherine Shivank Pinedo, NP  

## 2024-12-23 ENCOUNTER — Telehealth: Payer: Self-pay

## 2024-12-23 NOTE — Telephone Encounter (Signed)
 Copied from CRM [id number]. Topic: Clinical - Request for Lab/Test Order >> Dec 23, 2024  2:16 PM Mesmerise C wrote: Reason for CRM: Patient wants labs done at time of physical

## 2024-12-28 ENCOUNTER — Other Ambulatory Visit: Payer: Self-pay

## 2024-12-28 DIAGNOSIS — K2100 Gastro-esophageal reflux disease with esophagitis, without bleeding: Secondary | ICD-10-CM

## 2024-12-28 DIAGNOSIS — K2270 Barrett's esophagus without dysplasia: Secondary | ICD-10-CM

## 2024-12-28 MED ORDER — OMEPRAZOLE 20 MG PO CPDR: 20.0000 mg | DELAYED_RELEASE_CAPSULE | Freq: Every day | ORAL | 0 refills | Status: AC

## 2025-01-20 ENCOUNTER — Ambulatory Visit: Admitting: Primary Care

## 2025-01-20 ENCOUNTER — Encounter: Payer: Self-pay | Admitting: Primary Care

## 2025-01-20 VITALS — BP 122/80 | HR 63 | Temp 98.2°F | Ht 67.5 in | Wt 195.4 lb

## 2025-01-20 DIAGNOSIS — K2270 Barrett's esophagus without dysplasia: Secondary | ICD-10-CM

## 2025-01-20 DIAGNOSIS — Z1231 Encounter for screening mammogram for malignant neoplasm of breast: Secondary | ICD-10-CM

## 2025-01-20 DIAGNOSIS — E7800 Pure hypercholesterolemia, unspecified: Secondary | ICD-10-CM

## 2025-01-20 DIAGNOSIS — F330 Major depressive disorder, recurrent, mild: Secondary | ICD-10-CM

## 2025-01-20 DIAGNOSIS — F419 Anxiety disorder, unspecified: Secondary | ICD-10-CM

## 2025-01-20 DIAGNOSIS — K2100 Gastro-esophageal reflux disease with esophagitis, without bleeding: Secondary | ICD-10-CM

## 2025-01-20 DIAGNOSIS — Z0000 Encounter for general adult medical examination without abnormal findings: Secondary | ICD-10-CM

## 2025-01-20 DIAGNOSIS — Z23 Encounter for immunization: Secondary | ICD-10-CM

## 2025-01-20 NOTE — Assessment & Plan Note (Signed)
 Controlled.  We discussed to try weaning down to either omeprazole  10 mg daily versus famotidine  20 mg daily. She declines repeat upper endoscopy for now.

## 2025-01-20 NOTE — Patient Instructions (Addendum)
 Stop by the lab prior to leaving today. I will notify you of your results once received.   Complete your mammogram in July as scheduled.   Schedule nurse visits for shingles vaccines.  The second vaccine is due 2 to 6 months from the first.  It was a pleasure to see you today!

## 2025-01-20 NOTE — Assessment & Plan Note (Signed)
 Commended her on weight loss!  Continue rosuvastatin  5 mg daily. Repeat lipid panel pending

## 2025-01-20 NOTE — Assessment & Plan Note (Signed)
 Controlled.  ?Continue fluoxetine 40 mg daily. ?

## 2025-01-20 NOTE — Progress Notes (Signed)
 "  Subjective:    Patient ID: Katherine Steele, female    DOB: Sep 29, 1971, 54 y.o.   MRN: [id number]  Katherine Steele is a very pleasant 54 y.o. female who presents today for complete physical and follow up of chronic conditions.  Immunizations: -Tetanus: Completed in 2016 -Influenza: Influenza vaccine provided today.  -Shingles: Never completed   Diet: Fair diet.  Exercise: Regular exercise.  Eye exam: Completes annually  Dental exam: Completes semi-annually    Pap Smear: Hysterectomy  Mammogram: Completed in July 2025  Colonoscopy: Completed in 2022, due 2032  BP Readings from Last 3 Encounters:  01/20/25 122/80  03/24/24 124/76  01/20/24 126/78   Wt Readings from Last 3 Encounters:  01/20/25 195 lb 6 oz (88.6 kg)  03/24/24 204 lb (92.5 kg)  01/20/24 213 lb (96.6 kg)      Review of Systems  Constitutional:  Negative for unexpected weight change.  HENT:  Negative for rhinorrhea.   Respiratory:  Negative for cough and shortness of breath.   Cardiovascular:  Negative for chest pain.  Gastrointestinal:  Negative for constipation and diarrhea.  Genitourinary:  Negative for difficulty urinating.  Musculoskeletal:  Negative for arthralgias and myalgias.  Skin:  Negative for rash.  Allergic/Immunologic: Negative for environmental allergies.  Neurological:  Negative for dizziness and headaches.  Psychiatric/Behavioral:  The patient is not nervous/anxious.          Past Medical History:  Diagnosis Date   Acquired trigger finger 09/19/2016   BMI 33.0-33.9,adult    Endometriosis    s/p hysterectomy   GAD (generalized anxiety disorder)    Dr. Charma psychiatrist   Gender dysphoria 2014   did undergo 10 mo testosterone treatment   GERD (gastroesophageal reflux disease) 2017   HLD (hyperlipidemia) 08/12/2014   Mild off meds    MDD (major depressive disorder)    Dr. Charma psychiatrist   PONV (postoperative nausea and vomiting)    after finger surgery    Social  History   Socioeconomic History   Marital status: Married    Spouse name: michael   Number of children: 1   Years of education: Not on file   Highest education level: Associate degree: occupational, scientist, product/process development, or vocational program  Occupational History   Not on file  Tobacco Use   Smoking status: Former    Current packs/day: 0.00    Average packs/day: 0.3 packs/day for 6.0 years (1.6 ttl pk-yrs)    Types: Cigarettes    Quit date: 02/06/1993    Years since quitting: 31.9   Smokeless tobacco: Never   Tobacco comments:    Already quit  Vaping Use   Vaping status: Never Used  Substance and Sexual Activity   Alcohol use: Yes    Alcohol/week: 4.0 standard drinks of alcohol    Types: 2 Glasses of wine, 2 Cans of beer per week    Comment: social/weekends   Drug use: Not Currently    Types: Marijuana    Comment: stopped 2 weeks ago smoking marijuana   Sexual activity: Yes    Birth control/protection: None  Other Topics Concern   Not on file  Social History Narrative   Lives with husband Katherine Steele) and son Katherine Steele)   Occupation: lexicographer   Edu: Associate's degree   Activity: exercise 3x/wk   Diet: good water, fruits/vegetables some   Social Drivers of Health   Tobacco Use: Medium Risk (01/20/2025)   Patient History    Smoking Tobacco Use: Former  Smokeless Tobacco Use: Never    Passive Exposure: Not on file  Financial Resource Strain: Low Risk (01/18/2025)   Overall Financial Resource Strain (CARDIA)    Difficulty of Paying Living Expenses: Not hard at all  Food Insecurity: No Food Insecurity (01/18/2025)   Epic    Worried About Running Out of Food in the Last Year: Never true    Ran Out of Food in the Last Year: Never true  Transportation Needs: No Transportation Needs (01/18/2025)   Epic    Lack of Transportation (Medical): No    Lack of Transportation (Non-Medical): No  Physical Activity: Insufficiently Active (01/18/2025)   Exercise Vital Sign    Days of  Exercise per Week: 3 days    Minutes of Exercise per Session: 30 min  Stress: No Stress Concern Present (01/18/2025)   [employer] of Occupational Health - Occupational Stress Questionnaire    Feeling of Stress: Not at all  Social Connections: Socially Integrated (01/18/2025)   Social Connection and Isolation Panel    Frequency of Communication with Friends and Family: More than three times a week    Frequency of Social Gatherings with Friends and Family: Once a week    Attends Religious Services: More than 4 times per year    Active Member of [employer] or Organizations: Yes    Attends Banker Meetings: More than 4 times per year    Marital Status: Married  Catering Manager Violence: Not At Risk (01/18/2023)   Humiliation, Afraid, Rape, and Kick questionnaire    Fear of Current or Ex-Partner: No    Emotionally Abused: No    Physically Abused: No    Sexually Abused: No  Depression (PHQ2-9): Low Risk (01/20/2025)   Depression (PHQ2-9)    PHQ-2 Score: 0  Alcohol Screen: Low Risk (01/18/2025)   Alcohol Screen    Last Alcohol Screening Score (AUDIT): 2  Housing: Low Risk (01/18/2025)   Epic    Unable to Pay for Housing in the Last Year: No    Number of Times Moved in the Last Year: 0    Homeless in the Last Year: No  Utilities: Not At Risk (07/30/2024)   [facility] from [facility]   Epic    In the past 12 months has the electric, gas, oil, or water company threatened to shut off services in your home?: No  Health Literacy: Not on file    Past Surgical History:  Procedure Laterality Date   CESAREAN SECTION  2002   COLONOSCOPY  2007   [facility]NL [facility])   COLONOSCOPY WITH PROPOFOL  N/A 03/31/2021   WNL, rpt 10 yrs Marionette, Lucas B, MD)   ESOPHAGOGASTRODUODENOSCOPY (EGD) WITH PROPOFOL  N/A 03/21/2018   barrett's without dysplasia, mild chronic gastritis, reflux gastroesophagitis Marionette, [name], MD)   ESOPHAGOGASTRODUODENOSCOPY (EGD) WITH PROPOFOL  N/A  03/31/2021   small HH, shatski ring, ?barretts - biopsies WNL Marionette, Keene B, MD)   FINGER SURGERY Right 2007   little finger   OOPHORECTOMY Left 2006   TONSILLECTOMY  1975   TOTAL ABDOMINAL HYSTERECTOMY  12/2012   endometriosis and ovarian cysts s/p ovaries removed   TYMPANOSTOMY TUBE PLACEMENT Bilateral 1976    Family History  Problem Relation Age of Onset   Diabetes Mother    Cancer Mother 71       ovarian with mets   Rheum arthritis Mother    Hypertension Father    Alcohol abuse Father        history  Rheum arthritis Maternal Aunt    CAD Maternal Uncle 69       MI   Depression Paternal Uncle    Alcohol abuse Paternal Uncle    Suicidality Paternal Uncle    CAD Maternal Grandfather 72       MI   Depression Cousin    Alcohol abuse Cousin    Suicidality Cousin    Drug abuse Cousin    Suicidality Cousin    Anxiety disorder Son    Autism spectrum disorder Son    Intellectual disability Son    Stroke Neg Hx     Allergies[1]  Medications Ordered Prior to Encounter[2]  BP 122/80   Pulse 63   Temp 98.2 F (36.8 C) (Oral)   Ht 5' 7.5 (1.715 m)   Wt 195 lb 6 oz (88.6 kg)   LMP 07/17/2012   SpO2 95%   BMI 30.15 kg/m  Objective:   Physical Exam HENT:     Right Ear: Tympanic membrane and ear canal normal.     Left Ear: Tympanic membrane and ear canal normal.  Eyes:     Pupils: Pupils are equal, round, and reactive to light.  Cardiovascular:     Rate and Rhythm: Normal rate and regular rhythm.  Pulmonary:     Effort: Pulmonary effort is normal.     Breath sounds: Normal breath sounds.  Abdominal:     General: Bowel sounds are normal.     Palpations: Abdomen is soft.     Tenderness: There is no abdominal tenderness.  Musculoskeletal:        General: Normal range of motion.     Cervical back: Neck supple.  Skin:    General: Skin is warm and dry.  Neurological:     Mental Status: She is alert and oriented to person, place, and time.     Cranial  Nerves: No cranial nerve deficit.     Deep Tendon Reflexes:     Reflex Scores:      Patellar reflexes are 2+ on the right side and 2+ on the left side. Psychiatric:        Mood and Affect: Mood normal.     Physical Exam        Assessment & Plan:  Health maintenance examination Assessment & Plan: Influenza and Td provided today. Discussed schedule for Shingrix vaccines. Mammogram due in July, orders placed. Colonoscopy UTD, due 2032  Discussed the importance of a healthy diet and regular exercise in order for weight loss, and to reduce the risk of further co-morbidity.  Exam stable. Labs pending.  Follow up in 1 year for repeat physical.    Pure hypercholesterolemia Assessment & Plan: Commended her on weight loss!  Continue rosuvastatin  5 mg daily. Repeat lipid panel pending  Orders: -     Lipid panel -     Comprehensive metabolic panel with GFR -     CBC  Screening mammogram for breast cancer -     3D Screening Mammogram, Left and Right; Future  Gastroesophageal reflux disease with esophagitis without hemorrhage Assessment & Plan: Controlled.  We discussed to try weaning down to either omeprazole  10 mg daily versus famotidine  20 mg daily. She declines repeat upper endoscopy for now.    Barrett's esophagus without dysplasia Assessment & Plan: Due for repeat EGD for which she kindly declines for now.    MDD (major depressive disorder), recurrent episode, mild Assessment & Plan: Controlled.  Continue fluoxetine  40 mg daily.   Anxiety  disorder, unspecified type Assessment & Plan: Controlled.  Continue fluoxetine  40 mg daily     Assessment and Plan Assessment & Plan         [place] MARLA Gaskins, NP       [1] No Known Allergies [2]  Current Outpatient Medications on File Prior to Visit  Medication Sig Dispense Refill   Cholecalciferol (VITAMIN D3 PO) Take by mouth daily. 3000 IU     FLUoxetine  (PROZAC ) 40 MG capsule Take 1  capsule (40 mg total) by mouth daily. for anxiety and depression. 90 capsule 0   omeprazole  (PRILOSEC) 20 MG capsule Take 1 capsule (20 mg total) by mouth daily. for heartburn. 90 capsule 0   rosuvastatin  (CRESTOR ) 5 MG tablet Take 1 tablet (5 mg total) by mouth daily. 90 tablet 4   No current facility-administered medications on file prior to visit.   "

## 2025-01-20 NOTE — Assessment & Plan Note (Signed)
 Influenza and Td provided today. Discussed schedule for Shingrix vaccines. Mammogram due in July, orders placed. Colonoscopy UTD, due 2032  Discussed the importance of a healthy diet and regular exercise in order for weight loss, and to reduce the risk of further co-morbidity.  Exam stable. Labs pending.  Follow up in 1 year for repeat physical.

## 2025-01-20 NOTE — Addendum Note (Signed)
 Addended by: SEBASTIAN DANNA GRADE on: 01/20/2025 03:07 PM   Modules accepted: Orders

## 2025-01-20 NOTE — Assessment & Plan Note (Signed)
 Due for repeat EGD for which she kindly declines for now.

## 2025-01-21 ENCOUNTER — Ambulatory Visit: Payer: Self-pay | Admitting: Primary Care

## 2025-01-21 DIAGNOSIS — E7800 Pure hypercholesterolemia, unspecified: Secondary | ICD-10-CM

## 2025-01-21 LAB — COMPREHENSIVE METABOLIC PANEL WITH GFR
ALT: 9 U/L (ref 3–35)
AST: 11 U/L (ref 5–37)
Albumin: 4.4 g/dL (ref 3.5–5.2)
Alkaline Phosphatase: 78 U/L (ref 39–117)
BUN: 11 mg/dL (ref 6–23)
CO2: 30 meq/L (ref 19–32)
Calcium: 9.6 mg/dL (ref 8.4–10.5)
Chloride: 102 meq/L (ref 96–112)
Creatinine, Ser: 0.61 mg/dL (ref 0.40–1.20)
GFR: 100.65 mL/min
Glucose, Bld: 89 mg/dL (ref 70–99)
Potassium: 4.9 meq/L (ref 3.5–5.1)
Sodium: 138 meq/L (ref 135–145)
Total Bilirubin: 0.9 mg/dL (ref 0.2–1.2)
Total Protein: 6.7 g/dL (ref 6.0–8.3)

## 2025-01-21 LAB — LIPID PANEL
Cholesterol: 275 mg/dL — ABNORMAL HIGH (ref 28–200)
HDL: 61.7 mg/dL
LDL Cholesterol: 193 mg/dL — ABNORMAL HIGH (ref 10–99)
NonHDL: 213.07
Total CHOL/HDL Ratio: 4
Triglycerides: 100 mg/dL (ref 10.0–149.0)
VLDL: 20 mg/dL (ref 0.0–40.0)

## 2025-01-21 LAB — CBC
HCT: 40.7 % (ref 36.0–46.0)
Hemoglobin: 13.8 g/dL (ref 12.0–15.0)
MCHC: 33.8 g/dL (ref 30.0–36.0)
MCV: 87.1 fl (ref 78.0–100.0)
Platelets: 252 10*3/uL (ref 150.0–400.0)
RBC: 4.68 Mil/uL (ref 3.87–5.11)
RDW: 13 % (ref 11.5–15.5)
WBC: 6.1 10*3/uL (ref 4.0–10.5)

## 2025-07-23 ENCOUNTER — Encounter
